# Patient Record
Sex: Male | Born: 1935 | Race: White | Hispanic: No | Marital: Married | Smoking: Never smoker
Health system: Southern US, Community
[De-identification: ages and names within clinical notes are randomized; demographics above are authoritative.]

## PROBLEM LIST (undated history)

## (undated) DIAGNOSIS — J45909 Unspecified asthma, uncomplicated: Secondary | ICD-10-CM

## (undated) HISTORY — PX: EXTERNAL EAR SURGERY: SHX627

## (undated) HISTORY — DX: Unspecified asthma, uncomplicated: J45.909

## (undated) HISTORY — PX: FOOT SURGERY: SHX648

---

## 2002-04-17 ENCOUNTER — Ambulatory Visit (HOSPITAL_BASED_OUTPATIENT_CLINIC_OR_DEPARTMENT_OTHER): Admission: RE | Admit: 2002-04-17 | Discharge: 2002-04-17 | Payer: Self-pay | Admitting: Internal Medicine

## 2005-06-30 ENCOUNTER — Encounter (INDEPENDENT_AMBULATORY_CARE_PROVIDER_SITE_OTHER): Payer: Self-pay | Admitting: *Deleted

## 2005-06-30 ENCOUNTER — Ambulatory Visit (HOSPITAL_BASED_OUTPATIENT_CLINIC_OR_DEPARTMENT_OTHER): Admission: RE | Admit: 2005-06-30 | Discharge: 2005-06-30 | Payer: Self-pay | Admitting: Orthopedic Surgery

## 2005-09-26 DIAGNOSIS — D689 Coagulation defect, unspecified: Secondary | ICD-10-CM | POA: Insufficient documentation

## 2006-01-01 ENCOUNTER — Ambulatory Visit: Payer: Self-pay | Admitting: Cardiology

## 2006-01-07 ENCOUNTER — Ambulatory Visit: Payer: Self-pay

## 2006-01-14 ENCOUNTER — Ambulatory Visit: Payer: Self-pay | Admitting: Cardiology

## 2006-03-09 ENCOUNTER — Ambulatory Visit (HOSPITAL_COMMUNITY): Admission: RE | Admit: 2006-03-09 | Discharge: 2006-03-09 | Payer: Self-pay | Admitting: Cardiology

## 2006-03-09 ENCOUNTER — Ambulatory Visit: Payer: Self-pay | Admitting: Cardiovascular Disease

## 2008-01-23 ENCOUNTER — Ambulatory Visit: Payer: Self-pay | Admitting: Cardiology

## 2008-01-25 ENCOUNTER — Ambulatory Visit (HOSPITAL_COMMUNITY): Admission: RE | Admit: 2008-01-25 | Discharge: 2008-01-25 | Payer: Self-pay | Admitting: Cardiology

## 2008-01-25 ENCOUNTER — Ambulatory Visit: Payer: Self-pay | Admitting: Cardiology

## 2008-02-03 ENCOUNTER — Ambulatory Visit: Payer: Self-pay | Admitting: Cardiology

## 2008-02-06 ENCOUNTER — Ambulatory Visit: Payer: Self-pay | Admitting: Internal Medicine

## 2008-02-06 ENCOUNTER — Emergency Department (HOSPITAL_COMMUNITY): Admission: EM | Admit: 2008-02-06 | Discharge: 2008-02-06 | Payer: Self-pay | Admitting: Emergency Medicine

## 2008-02-10 ENCOUNTER — Ambulatory Visit: Payer: Self-pay

## 2008-02-10 ENCOUNTER — Ambulatory Visit: Payer: Self-pay | Admitting: Cardiology

## 2008-06-08 ENCOUNTER — Encounter: Payer: Self-pay | Admitting: Cardiology

## 2008-06-11 ENCOUNTER — Encounter: Payer: Self-pay | Admitting: Cardiology

## 2008-08-15 ENCOUNTER — Encounter (INDEPENDENT_AMBULATORY_CARE_PROVIDER_SITE_OTHER): Payer: Self-pay | Admitting: *Deleted

## 2008-09-13 DIAGNOSIS — D126 Benign neoplasm of colon, unspecified: Secondary | ICD-10-CM | POA: Insufficient documentation

## 2010-04-29 IMAGING — CR DG CHEST 2V
2 series · 2 of 2 positions shown · non-contrast
Comparison: None

CLINICAL DATA: Pre cardioversion.

CHEST - 2 VIEW

[view not recorded (1 of 2)]
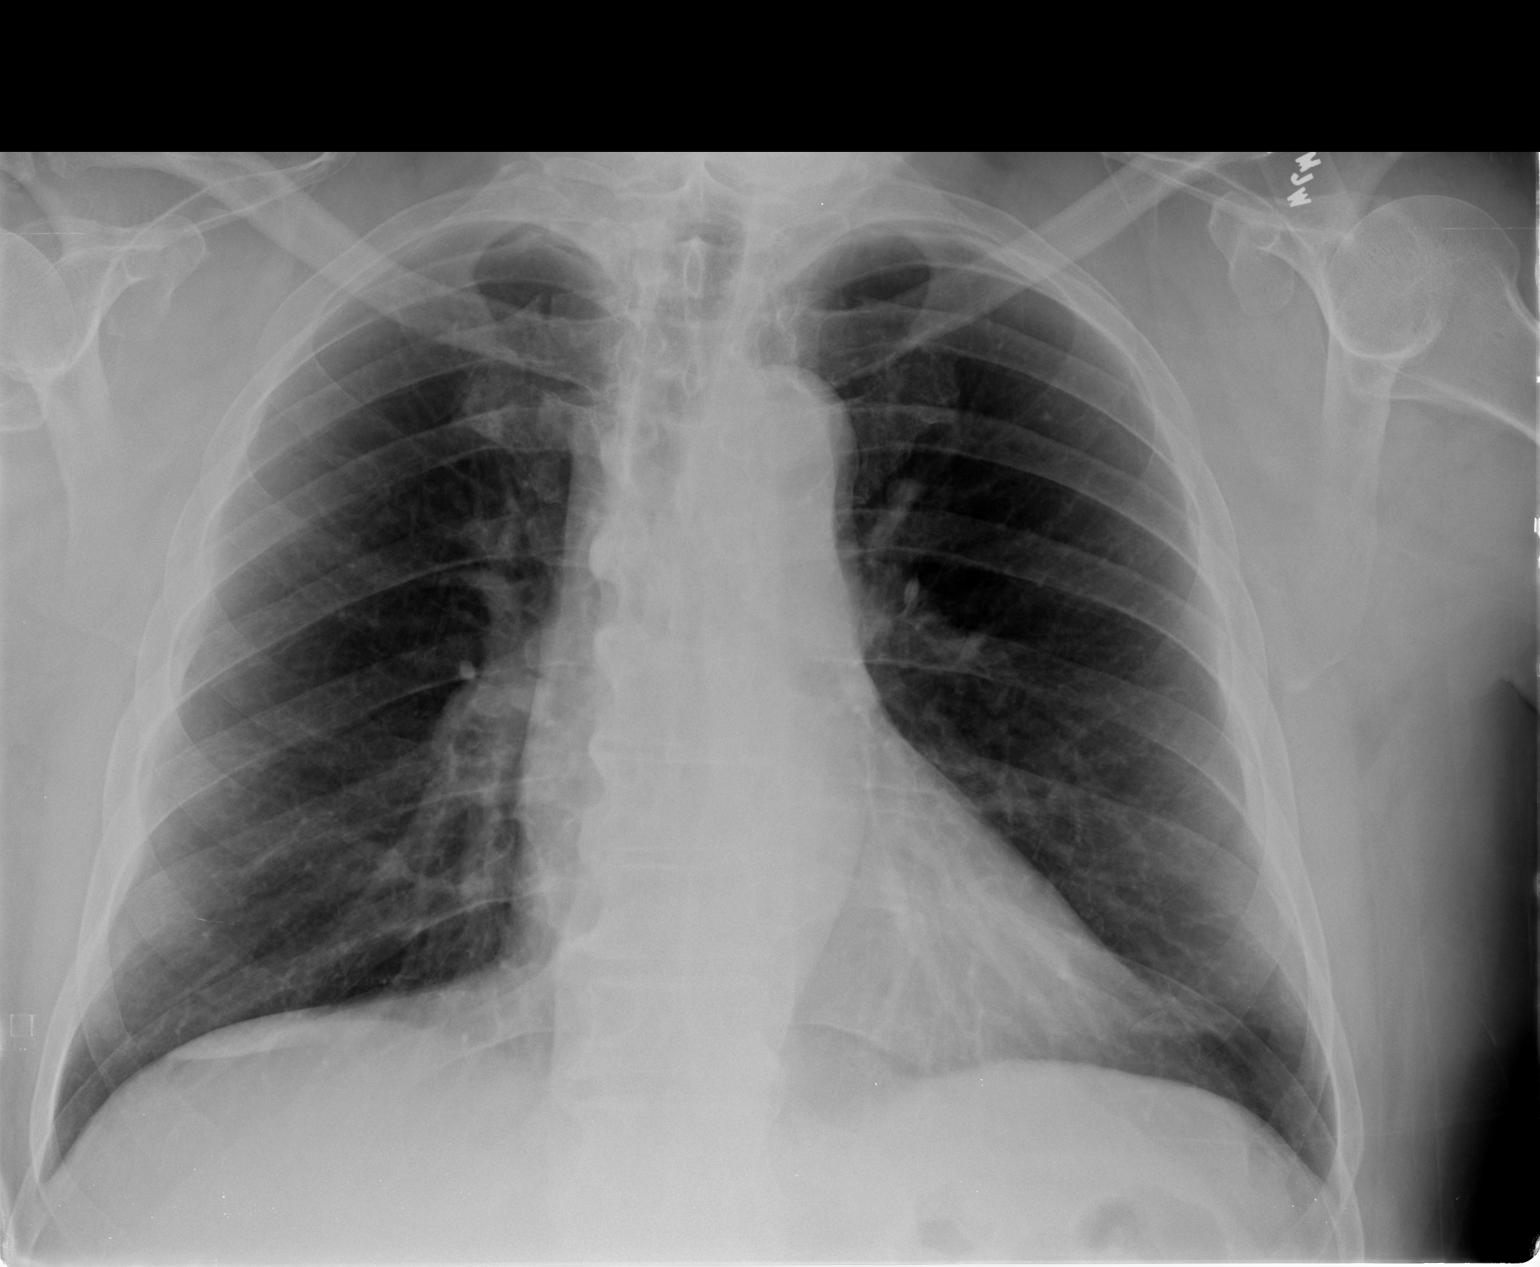

[view not recorded (2 of 2)]
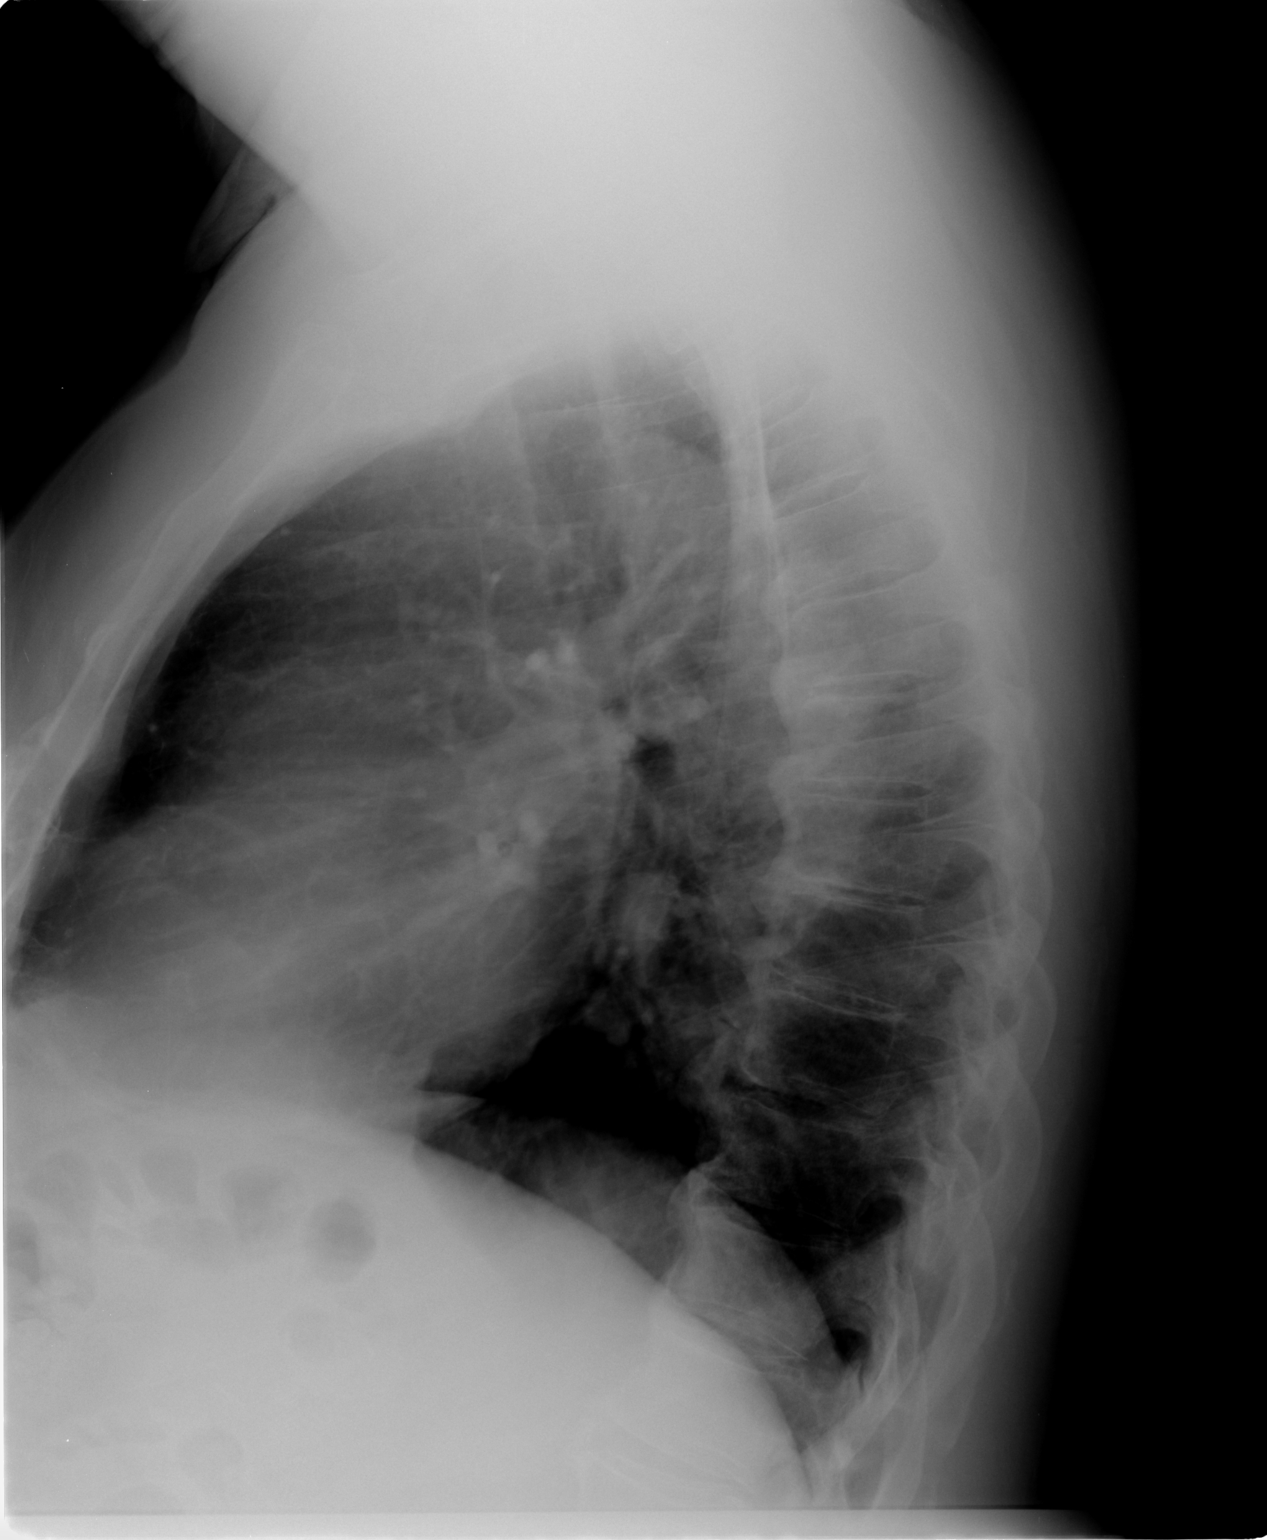

[2 of 2 positions shown; findings below may reference images not displayed]

FINDINGS: Heart upper limits normal in size.  No focal airspace
opacities or effusions.  Degenerative changes in the thoracic
spine.
IMPRESSION: No acute findings.

## 2010-05-12 LAB — URINALYSIS, ROUTINE W REFLEX MICROSCOPIC
Bilirubin Urine: NEGATIVE
Glucose, UA: NEGATIVE mg/dL
Ketones, ur: NEGATIVE mg/dL
Leukocytes, UA: NEGATIVE
Nitrite: NEGATIVE
Protein, ur: NEGATIVE mg/dL
Specific Gravity, Urine: 1.01 (ref 1.005–1.030)
Urobilinogen, UA: 0.2 mg/dL (ref 0.0–1.0)
pH: 6 (ref 5.0–8.0)

## 2010-05-12 LAB — TROPONIN I: Troponin I: 0.01 ng/mL (ref 0.00–0.06)

## 2010-05-12 LAB — COMPREHENSIVE METABOLIC PANEL
CO2: 25 mEq/L (ref 19–32)
Calcium: 9.3 mg/dL (ref 8.4–10.5)
Creatinine, Ser: 0.89 mg/dL (ref 0.4–1.5)
GFR calc Af Amer: >60 mL/min (ref >60)
GFR calc non Af Amer: >60 mL/min (ref >60)
Glucose, Bld: 131 mg/dL — ABNORMAL HIGH (ref 70–99)
Total Protein: 7.1 g/dL (ref 6.0–8.3)

## 2010-05-12 LAB — CBC
HCT: 48 % (ref 39.0–52.0)
Hemoglobin: 16.1 g/dL (ref 13.0–17.0)
MCHC: 33.5 g/dL (ref 30.0–36.0)
MCV: 92.3 fL (ref 78.0–100.0)
Platelets: 196 10*3/uL (ref 150–400)
RBC: 5.2 MIL/uL (ref 4.22–5.81)
RDW: 13.8 % (ref 11.5–15.5)
WBC: 11.6 10*3/uL — ABNORMAL HIGH (ref 4.0–10.5)

## 2010-05-12 LAB — DIFFERENTIAL
Basophils Absolute: 0 10*3/uL (ref 0.0–0.1)
Basophils Relative: 0 % (ref 0–1)
Eosinophils Absolute: 0.2 10*3/uL (ref 0.0–0.7)
Eosinophils Relative: 2 % (ref 0–5)
Lymphocytes Relative: 28 % (ref 12–46)
Lymphs Abs: 3.3 10*3/uL (ref 0.7–4.0)
Monocytes Absolute: 1.1 10*3/uL — ABNORMAL HIGH (ref 0.1–1.0)
Monocytes Relative: 10 % (ref 3–12)
Neutro Abs: 7 10*3/uL (ref 1.7–7.7)
Neutrophils Relative %: 60 % (ref 43–77)

## 2010-05-12 LAB — PROTIME-INR
INR: 2.4 — ABNORMAL HIGH (ref 0.00–1.49)
Prothrombin Time: 27.5 seconds — ABNORMAL HIGH (ref 11.6–15.2)

## 2010-05-12 LAB — CK TOTAL AND CKMB (NOT AT ARMC)
Relative Index: INVALID (ref 0.0–2.5)
Total CK: 87 U/L (ref 7–232)

## 2010-05-12 LAB — APTT: aPTT: 38 seconds — ABNORMAL HIGH (ref 24–37)

## 2010-05-12 LAB — URINE MICROSCOPIC-ADD ON

## 2010-06-10 NOTE — Op Note (Signed)
[name], Alan Beltran NO.:  [id number]   MEDICAL RECORD NO.:  [id number]          PATIENT TYPE:  OIB   LOCATION:  2899                         FACILITY:  [facility]   PHYSICIAN:  Rollene Rotunda, MD, FACCDATE OF BIRTH:  1934-02-08   DATE OF PROCEDURE:  01/25/2008  DATE OF DISCHARGE:  01/25/2008                               OPERATIVE REPORT   PRIMARY CARE PHYSICIAN:  Alfonse Alpers. Dagoberto Ligas, MD   PROCEDURE:  Direct current cardioversion.   INDICATIONS:  Symptomatic atrial fibrillation.   PROCEDURE NOTE:  The patient had therapeutic Coumadin levels documented  and recorded by Dr. Dagoberto Ligas.  This had been for greater than 4 weeks.  He  came to the holding area where he had appropriate informed consent.  Anesthesia was supplied by Dr. Krista Blue who used 210 mg of Pentothal.  He  had airway protection and monitoring throughout the procedure.  He was  cardioverted successfully with 150 joules of biphasic energy x1.  This  resulted in normal sinus rhythm with premature ventricular contractions.  There were no apparent complications.  Postprocedure EKG demonstrated  sinus rhythm.   FOLLOWUP:  The patient will see me in a couple of weeks.  He will remain  on his Coumadin.  His meds will be unchanged.      Rollene Rotunda, MD, [facility]  Electronically Signed     JH/MEDQ  D:  01/25/2008  T:  01/25/2008  Job:  [id number]   cc:   Alfonse Alpers. Dagoberto Ligas, M.D.

## 2010-06-10 NOTE — Assessment & Plan Note (Signed)
[facility]                            CARDIOLOGY OFFICE NOTE   Alan Beltran, Alan Beltran                          MRN:          [id number]  DATE:02/03/2008                            DOB:          May 13, 1934    PRIMARY CARE PHYSICIAN:  Alfonse Alpers. Dagoberto Ligas, MD   REASON FOR PRESENTATION:  Evaluate the patient with fatigue and atrial  fibrillation.   HISTORY OF PRESENT ILLNESS:  The patient is a pleasant 76 year old  gentleman who I met for the first time in late December.  He had been  seen by Dr. Diona Browner in the past.  He was in atrial fibrillation and  profoundly fatigued.  This has been a pattern in the past.  I did bring  him back and performed a cardioversion.  He was easily put back into  sinus rhythm.  Unfortunately, he remains just as fatigued.  I suppose at  the last time he had a cardioversion when he got much better, this time  he is still tired.  He says he sleeps well at night he thinks.  However,  when he wakes up in the day it is a struggle.  He is fatigued with any  activity.  He has not had any palpitation, presyncope, or syncope.  The  EKG today demonstrates that he is still in sinus rhythm.  He has had no  new shortness of breath.  He denies any PND or orthopnea.  He has had no  chest pressure, neck, or arm discomfort.  He is very limited in his  activities now because of this fatigue.  Of note, the patient does  report that he was told that he had sleep apnea with a sleep study 3-4  years ago.  He tried to wear the CPAP one night, but could not tolerate  it.  He took it back.   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 Ival Bible  note of 70% LAD lesion on catheterization in 1997.  However, the patient  does not collaborate  this.  I do not have any data on this either.  He  had a stress perfusion study, which was negative in 2007), back surgery  in 1973, surgery on his foot in 2004, ganglion cyst resection, and  resection of a palmar fibromatosis in 2007.   ALLERGIES/INTOLERANCES:  None.   MEDICATIONS:  1. Sertraline 50 mg daily.  2. Actos 45 mg daily.  3. Terazosin.  4. Allopurinol 300 mg daily.  5. Omeprazole 20 mg daily.  6. Spironolactone 25 mg daily.  7. Clotrimazole.  8. Zinc.  9. Coumadin.  10.Cardizem 240 mg daily.  11.Synthroid 275 mcg daily.   REVIEW OF SYSTEMS:  As stated in the HPI and otherwise negative for all  other systems.   PHYSICAL EXAMINATION:  GENERAL:  The patient is pleasant and in no  distress.  VITAL SIGNS:  Blood pressure 133/80, heart rate 73 and regular, weight  242 pounds, and body mass index 34.  HEENT:  Eyelids are unremarkable.  Pupils equal, round, and reactive to  light.  Fundi not visualized.  Oral mucosa unremarkable.  NECK:  No jugular venous distention at 45 degrees.  Carotid upstroke  brisk and symmetric.  No bruits.  No thyromegaly.  LYMPHATICS:  No cervical, axillary, or inguinal adenopathy.  LUNGS:  Clear to auscultation bilaterally.  BACK:  No costovertebral angle tenderness.  CHEST:  Unremarkable.  HEART:  PMI not displaced or sustained.  S1 and S2 within normal limits.  No S3.  No S4.  No clicks, rubs, and murmurs.  ABDOMEN:  Obese.  Positive bowel sounds.  Normal in frequency and pitch.  No bruits.  No rebound.  No  guarding.  No midline pulsatile mass.  No  hepatomegaly.  No splenomegaly.  SKIN:  No rashes.  No nodules.  EXTREMITIES:  Pulses 2+ throughout.  No edema.  No cyanosis.  No  clubbing.  NEURO:  Oriented to person, place, and time.  Cranial nerves II through  XII grossly intact.  Motor grossly intact.   EKG sinus rhythm, rate 73, axis within normal limits, intervals within  normal limits, poor anterior R-wave progression.    ASSESSMENT AND PLAN:  1. Fatigue.  At this point, the patient's fatigue has not responded to      cardioversion.  He is long enough out now that he has the      mechanical benefits as well as the electrical benefits of this.      Therefore, there is another etiology to his fatigue.  I feel      strongly that this could be sleep apnea.  However, he states he      absolutely could not wear the CPAP.  We discussed strategies and he      states he will try to lose 40 pounds and thinks he can do this.  I      told him the surgery is probably not a great option.  He certainly      would not want to consider tracheostomy.  He does not want to have      another sleep study to confirm this as a diagnosis.  He will      proceed with weight loss.  In the mean time, I think we should look      for other etiologies as well.  I will have him wear 24-hour Holter      monitor, although I doubt he is having any paroxysms of atrial      fibrillation.  Finally, he can have a stress perfusion study given      the report of previous coronary disease and the possibility (though      moderate at best) that this is manifestation of obstructive      coronary disease.  2. Atrial fibrillation as above.  He remains of on Coumadin.  3. Obesity.  Again, we discussed weight loss and I think he is      committed and probably will lose the weight and hopefully will see      him feel better with this.  4. Diabetes per Dr. Dagoberto Ligas.  5. Coronary disease as above.     Rollene Rotunda, MD, [facility]  Electronically Signed    JH/MedQ  DD: 02/03/2008  DT: 02/04/2008  Job #: [id number]   cc:   Alfonse Alpers. Dagoberto Ligas, M.D.

## 2010-06-10 NOTE — Assessment & Plan Note (Signed)
[facility]                            CARDIOLOGY OFFICE NOTE   Alan Beltran, Alan Beltran                          MRN:          [id number]  DATE:01/23/2008                            DOB:          25-Dec-1934    PRIMARY CARE PHYSICIAN:  Alfonse Alpers. Dagoberto Ligas, MD   REASON FOR PRESENTATION:  Evaluate the patient with atrial fibrillation.   HISTORY OF PRESENT ILLNESS:  The patient is a very pleasant 76 year old  gentleman who had been seen by Dr. Diona Browner in the past.  He has a  history of atrial fibrillation.  He actually underwent cardioversion in  February 2008 for management of this.  When he has been fibrillation in  the past, he has been very fatigued.  He says this fatigue started to  begin about 5 weeks ago.  He has been documented to be back in atrial  fibrillation.  He says he sees Dr. Dagoberto Ligas frequently.  He has had recent  EKGs documenting the fibrillation with a rapid rate.  He has had his  Cardizem titrated upward.  He is less fatigued than when they started  this therapy, but still not at his baseline.  He actually does not feel  any palpitations and had no presyncope or syncope.  He denies any chest  discomfort, neck or arm discomfort.  He has had no shortness of breath,  PND, or orthopnea.   PAST MEDICAL HISTORY:  1. Apparent hypopituitarism treated by Dr. Dagoberto Ligas.  2. Hypothyroidism.  3. Diabetes mellitus.  4. Atrial fibrillation maintained on Coumadin status post      cardioversion in the past.  5. Apparent coronary artery disease (there is a report in Dr.      Ival Bible note of a 70% LAD lesion at catheterization in 1997.      However, the patient does not know of this and I do not see the      documentation.  He has had negative stress perfusion studies with      the most recent being in December 2007).   PAST SURGICAL HISTORY:  1. Back surgery in 1973.  2. Surgery on his foot in 2004.  3. Ganglion cyst resection.  4. Resection of palmar  fibromatosis in 2007.   ALLERGIES/INTOLERANCES:  None.   MEDICATIONS:  1. Sertraline 50 mg daily.  2. Actos 45 mg daily.  3. Synthroid 175 mcg daily.  4. Terazosin 5 mg daily.  5. Allopurinol 300 mg daily.  6. Omeprazole 20 mg daily.  7. Spironolactone 25 mg daily.  8. Chlor-Trimeton.  9. Zinc.  10.Folic acid.  11.Coumadin.  12.Cardizem 240 mg daily.  13.Lasix.   SOCIAL HISTORY:  The patient is married.  He has 2 children.  He still  works in Airline pilot.  He does not smoke cigarettes or drink alcohol.   Family history is contributory for his father dying suddenly at age 16  of myocardial infarction.   REVIEW OF SYSTEMS:  As stated in the HPI and otherwise positive for  erectile dysfunction, joint pains, reflux.  Negative for all other  systems.  PHYSICAL EXAMINATION:  GENERAL:  The patient is pleasant and in no  distress.  VITAL SIGNS:  Blood pressure 134/92, heart rate 96 and irregular, weight  241 pounds, body mass index 34.  HEENT:  Eyes unremarkable, pupils equal, round, and reactive to light,  fundi not visualized, oral mucosa unremarkable.  NECK:  No jugular venous distention at 45 degrees, carotid upstroke  brisk and symmetric, no bruits, no thyromegaly.  LYMPHATICS:  No cervical, axillary, or inguinal adenopathy.  LUNGS:  Clear to auscultation bilaterally.  BACK:  No costovertebral angle.  CHEST:  Unremarkable.  HEART:  PMI not displaced or sustained, S1 and S2 within normal limits,  no S3, no murmurs.  ABDOMEN:  Obese, positive bowel sounds normal in frequency and pitch, no  bruits, no rebound, no guarding, no midline pulsatile mass, no  hepatomegaly, no splenomegaly.  SKIN:  No rashes, no nodules.  EXTREMITIES:  Pulses 2+ throughout, no edema, no cyanosis, no clubbing.  NEUROLOGIC:  Oriented to person, place, and time, cranial nerves II  through XII grossly intact, motor grossly intact.   EKG:  Atrial fibrillation, rate 96, axis within normal limits,  intervals  within normal limits, no acute ST-T wave changes.   ASSESSMENT AND PLAN:  1. Atrial fibrillation.  The patient has symptomatic recurrence of      atrial fibrillation.  He gets very fatigued with this.  At this      point, I think cardioversion without the addition of new drugs is      warranted since he stayed in sinus rhythm for almost 2 years after      the last procedure.  I have documented through Dr. Jerelene Redden office      that he has had therapeutic INRs (2.3 on December 09, 2007; 3.4 on      January 06, 2008; 2.3 on January 11, 2008; 2.7 on January 18, 2008).  Therefore, we can carryout the cardioversion this week.  If      he fails to maintain sinus rhythm for any reasonable length of time      after this, then I would treat him probably with Tikosyn given his      thyroid problems, although we could consider a class II medication      as well.  The cardioversion will be scheduled for Thursday.  2. Coronary artery disease.  The patient has had a negative stress      perfusion study in 2007.  He is not having any symptoms.  I am      going to try to pull old records from the time of his      catheterization in 1997 to clarify the issue of his LAD stenosis.  3. Dyslipidemia, per Dr. Dagoberto Ligas with the goal LDL less than 100 and      HDL greater than 40.  He currently is not on a statin, and      certainly if he has coronary disease, I will suggest that this be      retried by Dr. Dagoberto Ligas.  The patient was taken off Crestor and      Lipitor in the past.  4. Hypertension.  Blood pressure is controlled, and he will continue      the medications as listed.  5. Endocrinologic abnormalities.  This is followed very closely by Dr.      Dagoberto Ligas.  6. Followup.  I will see him at the time of his cardioversion.  Rollene Rotunda, MD, [facility]  Electronically Signed    JH/MedQ  DD: 01/23/2008  DT: 01/24/2008  Job #: [id number]   cc:   Alfonse Alpers. Dagoberto Ligas, M.D.

## 2010-06-13 NOTE — Consult Note (Signed)
NAMEFRANZ, SVEC                 ACCOUNT NO.:  [id number]   MEDICAL RECORD NO.:  [id number]          PATIENT TYPE:  OIB   LOCATION:  2899                         FACILITY:  [facility]   PHYSICIAN:  Veverly Fells. Excell Seltzer, MD  DATE OF BIRTH:  [birth year]   DATE OF CONSULTATION:  03/09/2006  DATE OF DISCHARGE:  03/09/2006                                 CONSULTATION   HISTORY OF PRESENT ILLNESS:  Alan Beltran presents as an outpatient for  electrical cardioversion for his chronic atrial fibrillation.  He is a  76 year old gentleman who has been anticoagulated with warfarin and has  persistent atrial fibrillation.  He complains of fatigue and exercise  intolerance and has subsequently been arranged for elective  cardioversion to see if restoring sinus rhythm will give him symptomatic  improvement.  His INR has been therapeutic over the preceding four  weeks.   Using the assistance of anesthesia, 200 mg of sodium pentothal were  administered.  Once the patient became unresponsive, a synchronized  cardioversion was performed with 150 joules.  This restored sinus rhythm  with one shock.  The patient had frequent ventricular ectopy as well as  atrial ectopy following cardioversion, but he maintained sinus rhythm.  He will be discharged home after a short period of observation.   CONCLUSION:  Successful D/C cardioversion of atrial fibrillation to  normal sinus rhythm.      Veverly Fells. Excell Seltzer, MD  Electronically Signed     MDC/MEDQ  D:  03/16/2006  T:  03/17/2006  Job:  [id number]   cc:   Jonelle Sidle, MD

## 2010-06-13 NOTE — Assessment & Plan Note (Signed)
[facility]CARE                            CARDIOLOGY OFFICE NOTE   Alan, Beltran                          MRN:          [id number]  DATE:01/01/2006                            DOB:          [birth year]    REASON FOR CONSULTATION:  Management of atrial fibrillation.   HISTORY OF PRESENT ILLNESS:  Alan Beltran is a pleasant 76 year old male  with history of including type 2 diabetes mellitus, hypothyroidism,  hypogonadism, hypertension, gout, hypercholesterolemia, and previously  documented coronary artery disease.  He had cardiac catheterization in  1997.  Based on available information, he had a 70% left anterior  descending stenosis at that time and had a followup Cardiolite  evaluation in 2000 revealing slight inferior ischemia.  He has not  manifested any active angina or limiting dyspnea on exertion on medical  therapy.  I also note a history of atrial fibrillation that seems to be  potentially chronic based on the patient's description and he has been  managed with Coumadin and Cardizem at least for the last few months by  Dr. Dagoberto Ligas.  Symptomatically Alan Beltran has had a feeling of general  heaviness in his legs as well as weakness, but no frank exertional  claudication or exertional angina.  He has NYHA Class II dyspnea on  exertion.  He states that he has felt this way over the last six months  and was previously walking up to four miles at a time on his treadmill  in one hour.  He is referred today for the possibility of considering a  cardioversion to see if perhaps his atrial fibrillation is related in  someway to his symptomatology.  He has been well managed for his atrial  fibrillation by Dr. Dagoberto Ligas with seemingly good heart rate control and  anticoagulation with a CHADS score of 2.  He has not had any recent  followup ischemic testing.   ALLERGIES:  No known drug allergies.   PRESENT MEDICATIONS:  1. Coumadin as directed by Dr. Dagoberto Ligas.  2. Crestor 5 mg p.o. daily.  3. Actos 45 mg p.o. daily.  4. Sertraline 50 mg daily.  5. Synthroid 175 mcg p.o. daily.  6. Terazosin 5 mg p.o. daily.  7. Allopurinol 300 mg p.o. daily.  8. Omeprazole 20 mg p.o. daily.  9. Spironolactone 25 mg p.o. daily.  10.Cardizem CD 240 mg p.o. daily.   PAST MEDICAL HISTORY:  As outlined in history of present illness.  He  has also had previous surgery on his back in 1973, his foot in 2004 and  hand in 2007.   SOCIAL HISTORY:  The patient is married and has two children.  Works as  a Human resources officer.  He denies any active tobacco or alcohol use.   FAMILY HISTORY:  Significant for coronary artery disease in the  patient's father who died at age 44.   REVIEW OF SYSTEMS:  As described in history of present illness.  He has  erectile dysfunction, arthritic pains, and reflux.   PHYSICAL EXAMINATION:  VITAL SIGNS:  Blood pressure today is 130/90,  heart  rate 76 and irregular.  Weight is 247 pounds.  GENERAL:  This is an obese male, seemed to be in no acute distress.  HEENT:  Conjunctivae was normal.  Pharynx is clear.  NECK:  Supple without jugular venous pressure.  There is no thyroid  tenderness noted.  LUNGS:  Generally clear with somewhat diminished breath sounds.  No  active wheezing.  CARDIAC:  Distant irregularly irregular rhythm with no loud systolic  murmur.  No pericardial rub or S3 gallop.  ABDOMEN:  Soft, protuberant/obese.  Cannot adequately palpate liver  edge.  The bowel sounds are present.  There is no guarding.  EXTREMITIES:  No significant pitting edema.  SKIN:  Warm and dry.  MUSCULOSKELETAL: No kyphosis is noted.  NEUROPSYCHIATRIC:  Alert and oriented x3.  Affect is normal.   Faxed electrocardiogram from Dr. Jerelene Redden office shows atrial  fibrillation with controlled rate in the 80's and a premature  ventricular contraction complex versus aberrantly conducted beat.  There  were otherwise nonspecific ST-T wave  changes.   IMPRESSION/RECOMMENDATIONS:  1. Atrial fibrillation, duration not entirely clear but presumably      chronic.  This is well-managed by Dr. Dagoberto Ligas on therapy including      Coumadin and Cardizem CD.  His CHADS score is 2.  It is not      entirely clear that his general fatigue and leg heaviness is      clearly related to the atrial fibrillation although not      unreasonable to consider the possibility of cardioversion to see if      he notices a change.  My worry is that he will ultimately revert to      atrial fibrillation and will continue with a strategy of heart rate      control and anticoagulation long term however.  Antiarrhythmic      therapy could be considered but would not pursue this at this      point.  Given his history of underlying ischemic heart disease, I      also think it would be reasonable to proceed with a Myoview to      insure that he is not having marked ischemia related to his      symptoms and also lower extremity arterial Dopplers to exclude      obstructive peripheral arterial disease.  I would ask that his      Coumadin be checked on a weekly basis so that we can document four      weeks of therapy to Coumadin with an INR in the 2 to 3 range prior      to proceeding with cardioversion.  Otherwise I will plan to see him      back to review his cardiac testing in the interim.  I did not make      any medication changes today.  2. Further plans to follow.     Alan Sidle, MD  Electronically Signed   SGM/MedQ  DD: 01/01/2006  DT: 01/01/2006  Job #: [id number]   cc:   Alfonse Alpers. Dagoberto Ligas, M.D.

## 2010-06-13 NOTE — Assessment & Plan Note (Signed)
[facility]                            CARDIOLOGY OFFICE NOTE   HANSEN, CARINO                          MRN:          [id number]  DATE:01/14/2006                            DOB:          Jun 18, 1934    ENDOCRINOLOGIST:  Dr. Corrin Parker.   REASON FOR VISIT:  Followup cardiac testing.   HISTORY OF PRESENT ILLNESS:  I saw Mr. Alan Beltran back on December 7.  He has  atrial fibrillation of uncertain duration and is being managed with  Coumadin and Cardizem CD at this point, with a CHADS score of 2.  He has  had some leg fatigue and general fatigue, as well as edema, but  otherwise has tolerated this fairly well.  I referred him for lower  extremity arterial studies which actually looked fairly reassuring,  reporting normal ABIs and no evidence of significant obstructive  disease.  He also underwent a basic ischemic evaluation with an  Adenosine Myoview reporting no evidence of scar or ischemia and an  ejection fraction of 51%.  I reviewed the results with the patient and  his wife today.  I do think it is still reasonable to consider an  elective cardioversion after 4 weeks of therapeutic Coumadin therapy to  see if he experiences any improvement in symptoms and sinus rhythm.  This may help to guide our future management of his dysrhythmia and  whether to consider anti- arrhythmia  therapy or not.  Most recent INRs  were 2.3 and 4.9 over the last week.   ALLERGIES:  No known drug allergies.   PRESENT MEDICATIONS:  1. Sertraline 50 mg p.o. daily.  2. Actos 45 mg p.o. daily.  3. Synthroid 175 mcg p.o. daily.  4. Terazosin 5 mg p.o. daily.  5. Allopurinol 300 mg p.o. daily.  6. Omeprazole 20 mg p.o. daily.  7. Spironolactone 25 mg p.o. daily.  8. Chlor-Trimeton p.r.n.  9. Zinc, folic acid.  10.Coumadin as directed by Dr. Dagoberto Ligas.  11.Cardizem CD 240 mg p.o. daily.  12.Lasix daily.   REVIEW OF SYSTEMS:  As in history of present illness.   PHYSICAL EXAMINATION:  Blood pressure is 135/90, heart rate is in the  80s in atrial fibrillation.  Weight is 249 pounds.  The patient is  comfortable and in no acute distress.  NECK:  Reveals an elevated jugular venous pressure without bruits.  LUNGS:  Clear, without labored breathing.  CARDIAC:  Irregular irregular rhythm without rub, murmur or gallop.  EXTREMITIES:  Show improving edema.   IMPRESSION/RECOMMENDATIONS:  1. Persistent atrial fibrillation, rate controlled and on Coumadin,      recent INR levels as reported above.  We will plan an elective      cardioversion once he has had 4 straight weeks of therapeutic INR      levels.  He is having his blood checked weekly through Dr. Jerelene Redden      office.  I have encouraged him to continue a basic walking regimen      in light of his recent studies and we will see if he notices any  symptomatic improvements following his cardioversion attempt.  2. Further plans to follow.     Jonelle Sidle, MD  Electronically Signed    SGM/MedQ  DD: 01/14/2006  DT: 01/14/2006  Job #: [id number]   cc:   Alfonse Alpers. Dagoberto Ligas, M.D.

## 2010-06-13 NOTE — Op Note (Signed)
[name], [name]                 ACCOUNT NO.:  [id number]   MEDICAL RECORD NO.:  [id number]          PATIENT TYPE:  AMB   LOCATION:  DSC                          FACILITY:  [facility]   PHYSICIAN:  Katy Fitch. Sypher, M.D. DATE OF BIRTH:  [birth year]   DATE OF PROCEDURE:  06/30/2005  DATE OF DISCHARGE:                                 OPERATIVE REPORT   PREOPERATIVE DIAGNOSIS:  1.  Left long finger A1/A2 pulley ganglion.  2.  Stenosing tenosynovitis right ring finger at A1 pulley.  3.  Mass volar aspect of right ring finger overlying flexor sheath at A2/C1      pulleys.   POSTOPERATIVE DIAGNOSIS:  1.  Left long finger A1/A2 ganglion.  2.  Right ring finger stenosing tenosynovitis at A1 pulley.  3.  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   OPERATION:  1.  Excision of left long finger A1/A2 pulley ganglion cyst.  2.  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  3.  Resection of the right ring finger volar mass consistent with myxomatous      degeneration of volar vein/ganglion.   OPERATIONS:  Katy Fitch. Sypher, M.D.   ASSISTANT:  Marveen Reeks. Dasnoit, P.A.-C.   ANESTHESIA:  2% lidocaine metacarpal head level block of left long finger  and right ring finger.   SUPERVISING ANESTHESIOLOGIST:  Janetta Hora. Gelene Mink, M.D.   INDICATIONS:  Alan Beltran is a 76 year old gentleman who is an avid golfer.  He presents for evaluation of bilateral hand predicaments that are impairing  his golf game.  He has a chronic right ring trigger finger and a mass over  the volar aspect of his right ring finger just proximal to his PIP flexion  crease. On the left hand, he had a firm painful mass overlying his A1/A2  pulley junction overlying the flexor sheath.  He requested the lesions be  addressed so he could improve his comfort while playing golf.  After  informed consent, he  is brought to the operating at this time.  Preoperatively, Dr. Gelene Mink studied the detailed medical records from Dr.  Dagoberto Ligas.  He was noted to have minor sleep apnea.  After informed consent, he  is brought to the operating room at this time.   PROCEDURE:  [name] is brought to the operating room and placed in  supine position on the operating table.  Following an anesthesia consult by  Dr. Gelene Mink, light sedation and monitored anesthesia care with digital  blocks utilizing 2% lidocaine was recommended.  [name] was transferred to  the operating room and placed in supine position followed by light sedation.  Pneumatic tourniquets were applied to the proximal right brachium and the  proximal left forearm with antecubital IV access on the left.  The arms were  prepped with Betadine soap solution and sterilely draped.  2% lidocaine  metacarpal head level blocks were placed at the MP level of the right  ring  finger and the left long finger.  When anesthesia was satisfactory, the  right arm was exsanguinated with an Esmarch bandage and atrial tourniquet  inflated to 260 mmHg due to systolic hypertension.  The procedure commenced  with a transverse incision in the distal palmar crease overlying the A1  pulley.  The subcutaneous tissues were carefully divided revealing  hypertrophic palmar fascia with nodular disease in the pre-tendinous fibers  of the ring finger.  The pre-tendinous fibers were resected after  circumferential dissection.  This relieved the palmar fibromatosis nodule.  The A1 pulley was isolated and split along its radial border with scalpel  and scissors.  The tendon was delivered and stripped of hypertrophic  synovium.   Attention was then directed to the distal PIP region of the ring finger.  The palpable mass was identified.  This was volar to the A2/C1 pulley  junction.  An oblique incision was fashioned following the [employer].  The subcutaneous tissues were  carefully divided revealing a myxomatous mass  adherent to one of the volar veins consistent with myxomatous degeneration  of a volar vein/ganglion formation.  This was circumferentially resected  with a portion of the vein.  Bipolar cautery was used to obtain hemostasis.   Both wounds were closed with intradermal 3-0 Prolene.  The wounds were then  dressed with Xeroflow, sterile gauze, and an Ace wrap.   Attention was then directed to the long finger of the left hand.  After  exsanguination of the left hand by direct compression, an arterial  tourniquet on the forearm was inflated to 260 mmHg.  The procedure commenced  with an oblique incision directly over the palpable mass.  The subcutaneous  tissues were carefully divided revealing the A1/A2 ganglion.  After blunt  retractors were placed to protect the neurovascular bundles, the ganglion  was dissected with a fine sharp rongeur.  A small portion of the pulley  system was resected with the ganglion to prevent recurrence.  The wound was  then repaired with intradermal 3-0 Prolene.  A compressive dressing was  applied with Xeroflow, sterile gauze, and an Ace wrap.  There were no  apparent complications.   [name] tolerated the surgery and anesthesia well.  He was transferred to  the recovery room with stable vital signs.  He will be discharged in the  care of his wife with a prescription for Darvocet N100 one p.o. q.4-6h.  p.r.n. pain, 20 tablets without refill.      Katy Fitch Sypher, M.D.  Electronically Signed     RVS/MEDQ  D:  06/30/2005  T:  06/30/2005  Job:  [id number]

## 2010-06-13 NOTE — Consult Note (Signed)
NAMEJAMONI, Alan Beltran NO.:  [id number]   MEDICAL RECORD NO.:  [id number]          PATIENT TYPE:  EMS   LOCATION:  MAJO                         FACILITY:  [facility]   PHYSICIAN:  Bevelyn Buckles. Bensimhon, MDDATE OF BIRTH:  05/25/1934   DATE OF CONSULTATION:  DATE OF DISCHARGE:  02/06/2008                                 CONSULTATION   PRIMARY CARDIOLOGIST:  Rollene Rotunda, MD, [facility]   PRIMARY CARE PHYSICIAN:  Alfonse Alpers. Dagoberto Ligas, MD   Mr. Arzuaga is a 76 year old Caucasian gentleman who presents to [facility] Emergency Room today complaining of profound weakness x5 weeks.  He  states Dr. Dagoberto Ligas is aware and so was Dr. Antoine Poche.  The patient is  scheduled for a stress Myoview this Friday, but states he just feels so  weak that he could not wait until then.  Mr. Coleman denies any other  associated symptoms.  He states Dr. Dagoberto Ligas has been working him up with  a lab work.  He states his testosterone dose was increased without  improvement in symptoms.  His Synthroid dose was increased.  When no  change was made in his generalized weakness, he was sent to Dr. Antoine Poche  for further evaluation, thus the stress test was scheduled.  However,  Mr. Parenteau states he just does not feel right today.  His legs are so  weak he can barely walk.  He denies any chest discomfort or any  shortness of breath.  Here in the emergency room, he has been in sinus  rhythm with frequent PVCs, otherwise normal sinus rhythm on the monitor.   PAST MEDICAL HISTORY:  1. Paroxysmal atrial fib status post cardioversion/Coumadin therapy.  2. Hypopituitarism.  3. Diabetes.  4. Obstructive sleep apnea with noncompliance to CPAP.  5. Gout.  6. Asthma.  7. Back surgery.  8. Stress Myoview negative in 2007.  9. Hypothyroidism.  10.Cardiac catheterization in 1997.   SOCIAL HISTORY:  He lives with his wife.  He is a Medical illustrator.  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  He has adult children.  Denies any  tobacco, EtOH, drug, or herbal medication use.  He used to walk and play  golf but states he has gotten so weak in his legs he cannot do that any  longer.   FAMILY HISTORY:  Mother deceased in her 33s secondary to aneurysm.  Father deceased at age 72 secondary to MI.  Sibling medical history  unknown.   REVIEW OF SYSTEMS:  Positive for mild lightheadedness, leg fatigue, and  generalized weakness and extreme fatigue.   ALLERGIES:  CRESTOR and LIPITOR.   CURRENT MEDICATIONS:  1. Metformin 500 b.i.d.  2. Actos 45.  3. Coumadin.  4. Sertraline 50.  5. Synthroid 275 mcg.  6. Terazosin 5.  7. Allopurinol 300.  8. Spironolactone 25.  9. Cardizem CD 240.  10.Nasonex nasal spray.   PHYSICAL EXAMINATION:  VITAL SIGNS:  Temperature is 97.1, heart rate 95,  respirations 18, and blood pressure 112/79.  The patient is saturating  98% on room air.  GENERAL:  No acute  distress.  HEENT:  Unremarkable.  NECK:  Supple without lymphadenopathy, JVD, or bruits.  CARDIOVASCULAR:  S1 and S2.  Regular rate and rhythm.  LUNGS:  Clear to auscultation.  SKIN:  Warm and dry.  ABDOMEN:  Soft and nontender.  Positive bowel sounds.  LOWER EXTREMITIES:  Without clubbing, cyanosis, or edema.  NEUROLOGICAL:  Alert and oriented x3.   Chest x-ray is pending.  EKG; sinus rhythm at a rate 82 with frequent  PVCs.  Lab work is pending at this time.   IMPRESSION:  Profound fatigue with unclear etiology in the setting of  obstructive sleep apnea, diabetes, paroxysmal atrial fibrillation, and  nonobstructive coronary artery disease.  The patient has been seen and  examined by Dr. Gala Romney who has discussed the patient's status with  Dr. Antoine Poche.  If blood work is stable, we will plan on discharging home  from the ER and followup with outpatient stress Myoview as previously  scheduled.  If lab work is abnormal, we will plan on admitting the  patient.      Dorian Pod, ACNP      Bevelyn Buckles.  Bensimhon, MD  Electronically Signed    MB/MEDQ  D:  02/15/2008  T:  02/16/2008  Job:  [id number]

## 2010-09-04 DIAGNOSIS — E119 Type 2 diabetes mellitus without complications: Secondary | ICD-10-CM | POA: Insufficient documentation

## 2010-10-31 LAB — CBC
HCT: 44.3 % (ref 39.0–52.0)
Hemoglobin: 14.8 g/dL (ref 13.0–17.0)
MCHC: 33.4 g/dL (ref 30.0–36.0)
RBC: 4.82 MIL/uL (ref 4.22–5.81)

## 2010-10-31 LAB — BASIC METABOLIC PANEL
CO2: 26 mEq/L (ref 19–32)
Calcium: 9.2 mg/dL (ref 8.4–10.5)
Chloride: 97 mEq/L (ref 96–112)
GFR calc Af Amer: >60 mL/min (ref >60)
Potassium: 3.8 mEq/L (ref 3.5–5.1)
Sodium: 132 mEq/L — ABNORMAL LOW (ref 135–145)

## 2010-10-31 LAB — PROTIME-INR: INR: 2.2 — ABNORMAL HIGH (ref 0.00–1.49)

## 2010-11-29 DIAGNOSIS — I4891 Unspecified atrial fibrillation: Secondary | ICD-10-CM | POA: Diagnosis not present

## 2010-11-29 DIAGNOSIS — R5383 Other fatigue: Secondary | ICD-10-CM | POA: Diagnosis not present

## 2010-11-29 DIAGNOSIS — I10 Essential (primary) hypertension: Secondary | ICD-10-CM | POA: Diagnosis not present

## 2010-11-29 DIAGNOSIS — J441 Chronic obstructive pulmonary disease with (acute) exacerbation: Secondary | ICD-10-CM | POA: Diagnosis not present

## 2010-11-29 DIAGNOSIS — E119 Type 2 diabetes mellitus without complications: Secondary | ICD-10-CM | POA: Diagnosis not present

## 2010-11-29 DIAGNOSIS — Z48812 Encounter for surgical aftercare following surgery on the circulatory system: Secondary | ICD-10-CM | POA: Diagnosis not present

## 2010-12-01 DIAGNOSIS — Z48812 Encounter for surgical aftercare following surgery on the circulatory system: Secondary | ICD-10-CM | POA: Diagnosis not present

## 2010-12-01 DIAGNOSIS — I10 Essential (primary) hypertension: Secondary | ICD-10-CM | POA: Diagnosis not present

## 2010-12-01 DIAGNOSIS — E119 Type 2 diabetes mellitus without complications: Secondary | ICD-10-CM | POA: Diagnosis not present

## 2010-12-01 DIAGNOSIS — R5383 Other fatigue: Secondary | ICD-10-CM | POA: Diagnosis not present

## 2010-12-01 DIAGNOSIS — J441 Chronic obstructive pulmonary disease with (acute) exacerbation: Secondary | ICD-10-CM | POA: Diagnosis not present

## 2010-12-01 DIAGNOSIS — I4891 Unspecified atrial fibrillation: Secondary | ICD-10-CM | POA: Diagnosis not present

## 2010-12-04 DIAGNOSIS — J441 Chronic obstructive pulmonary disease with (acute) exacerbation: Secondary | ICD-10-CM | POA: Diagnosis not present

## 2010-12-04 DIAGNOSIS — R5383 Other fatigue: Secondary | ICD-10-CM | POA: Diagnosis not present

## 2010-12-04 DIAGNOSIS — E119 Type 2 diabetes mellitus without complications: Secondary | ICD-10-CM | POA: Diagnosis not present

## 2010-12-04 DIAGNOSIS — I4891 Unspecified atrial fibrillation: Secondary | ICD-10-CM | POA: Diagnosis not present

## 2010-12-04 DIAGNOSIS — Z48812 Encounter for surgical aftercare following surgery on the circulatory system: Secondary | ICD-10-CM | POA: Diagnosis not present

## 2010-12-04 DIAGNOSIS — I10 Essential (primary) hypertension: Secondary | ICD-10-CM | POA: Diagnosis not present

## 2010-12-05 DIAGNOSIS — Z48812 Encounter for surgical aftercare following surgery on the circulatory system: Secondary | ICD-10-CM | POA: Diagnosis not present

## 2010-12-05 DIAGNOSIS — J441 Chronic obstructive pulmonary disease with (acute) exacerbation: Secondary | ICD-10-CM | POA: Diagnosis not present

## 2010-12-05 DIAGNOSIS — E119 Type 2 diabetes mellitus without complications: Secondary | ICD-10-CM | POA: Diagnosis not present

## 2010-12-05 DIAGNOSIS — I4891 Unspecified atrial fibrillation: Secondary | ICD-10-CM | POA: Diagnosis not present

## 2010-12-05 DIAGNOSIS — I10 Essential (primary) hypertension: Secondary | ICD-10-CM | POA: Diagnosis not present

## 2010-12-05 DIAGNOSIS — R5381 Other malaise: Secondary | ICD-10-CM | POA: Diagnosis not present

## 2010-12-08 DIAGNOSIS — Z48812 Encounter for surgical aftercare following surgery on the circulatory system: Secondary | ICD-10-CM | POA: Diagnosis not present

## 2010-12-08 DIAGNOSIS — J441 Chronic obstructive pulmonary disease with (acute) exacerbation: Secondary | ICD-10-CM | POA: Diagnosis not present

## 2010-12-08 DIAGNOSIS — I10 Essential (primary) hypertension: Secondary | ICD-10-CM | POA: Diagnosis not present

## 2010-12-08 DIAGNOSIS — E119 Type 2 diabetes mellitus without complications: Secondary | ICD-10-CM | POA: Diagnosis not present

## 2010-12-08 DIAGNOSIS — R5383 Other fatigue: Secondary | ICD-10-CM | POA: Diagnosis not present

## 2010-12-08 DIAGNOSIS — I4891 Unspecified atrial fibrillation: Secondary | ICD-10-CM | POA: Diagnosis not present

## 2010-12-10 DIAGNOSIS — J441 Chronic obstructive pulmonary disease with (acute) exacerbation: Secondary | ICD-10-CM | POA: Diagnosis not present

## 2010-12-10 DIAGNOSIS — R5383 Other fatigue: Secondary | ICD-10-CM | POA: Diagnosis not present

## 2010-12-10 DIAGNOSIS — Z48812 Encounter for surgical aftercare following surgery on the circulatory system: Secondary | ICD-10-CM | POA: Diagnosis not present

## 2010-12-10 DIAGNOSIS — I4891 Unspecified atrial fibrillation: Secondary | ICD-10-CM | POA: Diagnosis not present

## 2010-12-10 DIAGNOSIS — E119 Type 2 diabetes mellitus without complications: Secondary | ICD-10-CM | POA: Diagnosis not present

## 2010-12-10 DIAGNOSIS — I10 Essential (primary) hypertension: Secondary | ICD-10-CM | POA: Diagnosis not present

## 2010-12-11 DIAGNOSIS — Z48812 Encounter for surgical aftercare following surgery on the circulatory system: Secondary | ICD-10-CM | POA: Diagnosis not present

## 2010-12-11 DIAGNOSIS — E119 Type 2 diabetes mellitus without complications: Secondary | ICD-10-CM | POA: Diagnosis not present

## 2010-12-11 DIAGNOSIS — I4891 Unspecified atrial fibrillation: Secondary | ICD-10-CM | POA: Diagnosis not present

## 2010-12-11 DIAGNOSIS — R5381 Other malaise: Secondary | ICD-10-CM | POA: Diagnosis not present

## 2010-12-11 DIAGNOSIS — I10 Essential (primary) hypertension: Secondary | ICD-10-CM | POA: Diagnosis not present

## 2010-12-11 DIAGNOSIS — J441 Chronic obstructive pulmonary disease with (acute) exacerbation: Secondary | ICD-10-CM | POA: Diagnosis not present

## 2010-12-12 DIAGNOSIS — J441 Chronic obstructive pulmonary disease with (acute) exacerbation: Secondary | ICD-10-CM | POA: Diagnosis not present

## 2010-12-12 DIAGNOSIS — I10 Essential (primary) hypertension: Secondary | ICD-10-CM | POA: Diagnosis not present

## 2010-12-12 DIAGNOSIS — E119 Type 2 diabetes mellitus without complications: Secondary | ICD-10-CM | POA: Diagnosis not present

## 2010-12-12 DIAGNOSIS — I4891 Unspecified atrial fibrillation: Secondary | ICD-10-CM | POA: Diagnosis not present

## 2010-12-12 DIAGNOSIS — Z48812 Encounter for surgical aftercare following surgery on the circulatory system: Secondary | ICD-10-CM | POA: Diagnosis not present

## 2010-12-12 DIAGNOSIS — R5383 Other fatigue: Secondary | ICD-10-CM | POA: Diagnosis not present

## 2010-12-15 DIAGNOSIS — J441 Chronic obstructive pulmonary disease with (acute) exacerbation: Secondary | ICD-10-CM | POA: Diagnosis not present

## 2010-12-15 DIAGNOSIS — Z48812 Encounter for surgical aftercare following surgery on the circulatory system: Secondary | ICD-10-CM | POA: Diagnosis not present

## 2010-12-15 DIAGNOSIS — R5381 Other malaise: Secondary | ICD-10-CM | POA: Diagnosis not present

## 2010-12-15 DIAGNOSIS — I4891 Unspecified atrial fibrillation: Secondary | ICD-10-CM | POA: Diagnosis not present

## 2010-12-15 DIAGNOSIS — I10 Essential (primary) hypertension: Secondary | ICD-10-CM | POA: Diagnosis not present

## 2010-12-15 DIAGNOSIS — E119 Type 2 diabetes mellitus without complications: Secondary | ICD-10-CM | POA: Diagnosis not present

## 2010-12-17 DIAGNOSIS — I4891 Unspecified atrial fibrillation: Secondary | ICD-10-CM | POA: Diagnosis not present

## 2010-12-17 DIAGNOSIS — R5383 Other fatigue: Secondary | ICD-10-CM | POA: Diagnosis not present

## 2010-12-17 DIAGNOSIS — I10 Essential (primary) hypertension: Secondary | ICD-10-CM | POA: Diagnosis not present

## 2010-12-17 DIAGNOSIS — E119 Type 2 diabetes mellitus without complications: Secondary | ICD-10-CM | POA: Diagnosis not present

## 2010-12-17 DIAGNOSIS — Z48812 Encounter for surgical aftercare following surgery on the circulatory system: Secondary | ICD-10-CM | POA: Diagnosis not present

## 2010-12-17 DIAGNOSIS — J441 Chronic obstructive pulmonary disease with (acute) exacerbation: Secondary | ICD-10-CM | POA: Diagnosis not present

## 2010-12-19 DIAGNOSIS — I4891 Unspecified atrial fibrillation: Secondary | ICD-10-CM | POA: Diagnosis not present

## 2010-12-19 DIAGNOSIS — I10 Essential (primary) hypertension: Secondary | ICD-10-CM | POA: Diagnosis not present

## 2010-12-19 DIAGNOSIS — Z48812 Encounter for surgical aftercare following surgery on the circulatory system: Secondary | ICD-10-CM | POA: Diagnosis not present

## 2010-12-19 DIAGNOSIS — E119 Type 2 diabetes mellitus without complications: Secondary | ICD-10-CM | POA: Diagnosis not present

## 2010-12-19 DIAGNOSIS — R5383 Other fatigue: Secondary | ICD-10-CM | POA: Diagnosis not present

## 2010-12-19 DIAGNOSIS — J441 Chronic obstructive pulmonary disease with (acute) exacerbation: Secondary | ICD-10-CM | POA: Diagnosis not present

## 2010-12-23 DIAGNOSIS — Z48812 Encounter for surgical aftercare following surgery on the circulatory system: Secondary | ICD-10-CM | POA: Diagnosis not present

## 2010-12-23 DIAGNOSIS — E119 Type 2 diabetes mellitus without complications: Secondary | ICD-10-CM | POA: Diagnosis not present

## 2010-12-23 DIAGNOSIS — R5383 Other fatigue: Secondary | ICD-10-CM | POA: Diagnosis not present

## 2010-12-23 DIAGNOSIS — I10 Essential (primary) hypertension: Secondary | ICD-10-CM | POA: Diagnosis not present

## 2010-12-23 DIAGNOSIS — J441 Chronic obstructive pulmonary disease with (acute) exacerbation: Secondary | ICD-10-CM | POA: Diagnosis not present

## 2010-12-23 DIAGNOSIS — I4891 Unspecified atrial fibrillation: Secondary | ICD-10-CM | POA: Diagnosis not present

## 2010-12-25 DIAGNOSIS — I10 Essential (primary) hypertension: Secondary | ICD-10-CM | POA: Diagnosis not present

## 2010-12-25 DIAGNOSIS — I4891 Unspecified atrial fibrillation: Secondary | ICD-10-CM | POA: Diagnosis not present

## 2010-12-25 DIAGNOSIS — J441 Chronic obstructive pulmonary disease with (acute) exacerbation: Secondary | ICD-10-CM | POA: Diagnosis not present

## 2010-12-25 DIAGNOSIS — E119 Type 2 diabetes mellitus without complications: Secondary | ICD-10-CM | POA: Diagnosis not present

## 2010-12-25 DIAGNOSIS — R5383 Other fatigue: Secondary | ICD-10-CM | POA: Diagnosis not present

## 2010-12-25 DIAGNOSIS — Z48812 Encounter for surgical aftercare following surgery on the circulatory system: Secondary | ICD-10-CM | POA: Diagnosis not present

## 2010-12-30 DIAGNOSIS — J441 Chronic obstructive pulmonary disease with (acute) exacerbation: Secondary | ICD-10-CM | POA: Diagnosis not present

## 2010-12-30 DIAGNOSIS — E119 Type 2 diabetes mellitus without complications: Secondary | ICD-10-CM | POA: Diagnosis not present

## 2010-12-30 DIAGNOSIS — R5383 Other fatigue: Secondary | ICD-10-CM | POA: Diagnosis not present

## 2010-12-30 DIAGNOSIS — I4891 Unspecified atrial fibrillation: Secondary | ICD-10-CM | POA: Diagnosis not present

## 2010-12-30 DIAGNOSIS — I10 Essential (primary) hypertension: Secondary | ICD-10-CM | POA: Diagnosis not present

## 2010-12-30 DIAGNOSIS — Z48812 Encounter for surgical aftercare following surgery on the circulatory system: Secondary | ICD-10-CM | POA: Diagnosis not present

## 2011-01-01 DIAGNOSIS — E119 Type 2 diabetes mellitus without complications: Secondary | ICD-10-CM | POA: Diagnosis not present

## 2011-01-01 DIAGNOSIS — I4891 Unspecified atrial fibrillation: Secondary | ICD-10-CM | POA: Diagnosis not present

## 2011-01-01 DIAGNOSIS — J441 Chronic obstructive pulmonary disease with (acute) exacerbation: Secondary | ICD-10-CM | POA: Diagnosis not present

## 2011-01-01 DIAGNOSIS — I10 Essential (primary) hypertension: Secondary | ICD-10-CM | POA: Diagnosis not present

## 2011-01-01 DIAGNOSIS — R5381 Other malaise: Secondary | ICD-10-CM | POA: Diagnosis not present

## 2011-01-01 DIAGNOSIS — Z48812 Encounter for surgical aftercare following surgery on the circulatory system: Secondary | ICD-10-CM | POA: Diagnosis not present

## 2011-01-02 DIAGNOSIS — R5381 Other malaise: Secondary | ICD-10-CM | POA: Diagnosis not present

## 2011-01-02 DIAGNOSIS — E119 Type 2 diabetes mellitus without complications: Secondary | ICD-10-CM | POA: Diagnosis not present

## 2011-01-02 DIAGNOSIS — J441 Chronic obstructive pulmonary disease with (acute) exacerbation: Secondary | ICD-10-CM | POA: Diagnosis not present

## 2011-01-02 DIAGNOSIS — Z48812 Encounter for surgical aftercare following surgery on the circulatory system: Secondary | ICD-10-CM | POA: Diagnosis not present

## 2011-01-02 DIAGNOSIS — I10 Essential (primary) hypertension: Secondary | ICD-10-CM | POA: Diagnosis not present

## 2011-01-02 DIAGNOSIS — I4891 Unspecified atrial fibrillation: Secondary | ICD-10-CM | POA: Diagnosis not present

## 2011-01-07 DIAGNOSIS — J441 Chronic obstructive pulmonary disease with (acute) exacerbation: Secondary | ICD-10-CM | POA: Diagnosis not present

## 2011-01-07 DIAGNOSIS — Z48812 Encounter for surgical aftercare following surgery on the circulatory system: Secondary | ICD-10-CM | POA: Diagnosis not present

## 2011-01-07 DIAGNOSIS — E119 Type 2 diabetes mellitus without complications: Secondary | ICD-10-CM | POA: Diagnosis not present

## 2011-01-07 DIAGNOSIS — I4891 Unspecified atrial fibrillation: Secondary | ICD-10-CM | POA: Diagnosis not present

## 2011-01-07 DIAGNOSIS — I10 Essential (primary) hypertension: Secondary | ICD-10-CM | POA: Diagnosis not present

## 2011-01-07 DIAGNOSIS — R5381 Other malaise: Secondary | ICD-10-CM | POA: Diagnosis not present

## 2011-01-09 DIAGNOSIS — I4891 Unspecified atrial fibrillation: Secondary | ICD-10-CM | POA: Diagnosis not present

## 2011-01-09 DIAGNOSIS — J441 Chronic obstructive pulmonary disease with (acute) exacerbation: Secondary | ICD-10-CM | POA: Diagnosis not present

## 2011-01-09 DIAGNOSIS — R5381 Other malaise: Secondary | ICD-10-CM | POA: Diagnosis not present

## 2011-01-09 DIAGNOSIS — E119 Type 2 diabetes mellitus without complications: Secondary | ICD-10-CM | POA: Diagnosis not present

## 2011-01-09 DIAGNOSIS — I10 Essential (primary) hypertension: Secondary | ICD-10-CM | POA: Diagnosis not present

## 2011-01-09 DIAGNOSIS — Z48812 Encounter for surgical aftercare following surgery on the circulatory system: Secondary | ICD-10-CM | POA: Diagnosis not present

## 2011-01-12 DIAGNOSIS — E119 Type 2 diabetes mellitus without complications: Secondary | ICD-10-CM | POA: Diagnosis not present

## 2011-01-12 DIAGNOSIS — I4891 Unspecified atrial fibrillation: Secondary | ICD-10-CM | POA: Diagnosis not present

## 2011-01-12 DIAGNOSIS — J441 Chronic obstructive pulmonary disease with (acute) exacerbation: Secondary | ICD-10-CM | POA: Diagnosis not present

## 2011-01-12 DIAGNOSIS — R5383 Other fatigue: Secondary | ICD-10-CM | POA: Diagnosis not present

## 2011-01-12 DIAGNOSIS — Z48812 Encounter for surgical aftercare following surgery on the circulatory system: Secondary | ICD-10-CM | POA: Diagnosis not present

## 2011-01-12 DIAGNOSIS — I10 Essential (primary) hypertension: Secondary | ICD-10-CM | POA: Diagnosis not present

## 2011-01-15 DIAGNOSIS — J441 Chronic obstructive pulmonary disease with (acute) exacerbation: Secondary | ICD-10-CM | POA: Diagnosis not present

## 2011-01-15 DIAGNOSIS — E119 Type 2 diabetes mellitus without complications: Secondary | ICD-10-CM | POA: Diagnosis not present

## 2011-01-15 DIAGNOSIS — R5381 Other malaise: Secondary | ICD-10-CM | POA: Diagnosis not present

## 2011-01-15 DIAGNOSIS — Z48812 Encounter for surgical aftercare following surgery on the circulatory system: Secondary | ICD-10-CM | POA: Diagnosis not present

## 2011-01-15 DIAGNOSIS — I4891 Unspecified atrial fibrillation: Secondary | ICD-10-CM | POA: Diagnosis not present

## 2011-01-15 DIAGNOSIS — I10 Essential (primary) hypertension: Secondary | ICD-10-CM | POA: Diagnosis not present

## 2011-01-21 DIAGNOSIS — J441 Chronic obstructive pulmonary disease with (acute) exacerbation: Secondary | ICD-10-CM | POA: Diagnosis not present

## 2011-01-21 DIAGNOSIS — I4891 Unspecified atrial fibrillation: Secondary | ICD-10-CM | POA: Diagnosis not present

## 2011-01-21 DIAGNOSIS — Z48812 Encounter for surgical aftercare following surgery on the circulatory system: Secondary | ICD-10-CM | POA: Diagnosis not present

## 2011-01-21 DIAGNOSIS — I10 Essential (primary) hypertension: Secondary | ICD-10-CM | POA: Diagnosis not present

## 2011-01-21 DIAGNOSIS — E119 Type 2 diabetes mellitus without complications: Secondary | ICD-10-CM | POA: Diagnosis not present

## 2011-01-21 DIAGNOSIS — R5381 Other malaise: Secondary | ICD-10-CM | POA: Diagnosis not present

## 2011-01-27 DIAGNOSIS — Z48812 Encounter for surgical aftercare following surgery on the circulatory system: Secondary | ICD-10-CM | POA: Diagnosis not present

## 2011-01-27 DIAGNOSIS — I10 Essential (primary) hypertension: Secondary | ICD-10-CM | POA: Diagnosis not present

## 2011-01-27 DIAGNOSIS — J441 Chronic obstructive pulmonary disease with (acute) exacerbation: Secondary | ICD-10-CM | POA: Diagnosis not present

## 2011-01-27 DIAGNOSIS — I4891 Unspecified atrial fibrillation: Secondary | ICD-10-CM | POA: Diagnosis not present

## 2011-01-27 DIAGNOSIS — R5383 Other fatigue: Secondary | ICD-10-CM | POA: Diagnosis not present

## 2011-01-27 DIAGNOSIS — R5381 Other malaise: Secondary | ICD-10-CM | POA: Diagnosis not present

## 2011-01-27 DIAGNOSIS — E119 Type 2 diabetes mellitus without complications: Secondary | ICD-10-CM | POA: Diagnosis not present

## 2011-01-28 DIAGNOSIS — E291 Testicular hypofunction: Secondary | ICD-10-CM | POA: Diagnosis not present

## 2011-01-28 DIAGNOSIS — G4733 Obstructive sleep apnea (adult) (pediatric): Secondary | ICD-10-CM | POA: Diagnosis not present

## 2011-01-28 DIAGNOSIS — I10 Essential (primary) hypertension: Secondary | ICD-10-CM | POA: Diagnosis not present

## 2011-01-28 DIAGNOSIS — I4891 Unspecified atrial fibrillation: Secondary | ICD-10-CM | POA: Diagnosis not present

## 2011-01-28 DIAGNOSIS — J441 Chronic obstructive pulmonary disease with (acute) exacerbation: Secondary | ICD-10-CM | POA: Diagnosis not present

## 2011-01-28 DIAGNOSIS — E119 Type 2 diabetes mellitus without complications: Secondary | ICD-10-CM | POA: Diagnosis not present

## 2011-02-04 DIAGNOSIS — G4733 Obstructive sleep apnea (adult) (pediatric): Secondary | ICD-10-CM | POA: Diagnosis not present

## 2011-02-04 DIAGNOSIS — I4891 Unspecified atrial fibrillation: Secondary | ICD-10-CM | POA: Diagnosis not present

## 2011-02-04 DIAGNOSIS — E119 Type 2 diabetes mellitus without complications: Secondary | ICD-10-CM | POA: Diagnosis not present

## 2011-02-04 DIAGNOSIS — E291 Testicular hypofunction: Secondary | ICD-10-CM | POA: Diagnosis not present

## 2011-02-04 DIAGNOSIS — I10 Essential (primary) hypertension: Secondary | ICD-10-CM | POA: Diagnosis not present

## 2011-02-04 DIAGNOSIS — J441 Chronic obstructive pulmonary disease with (acute) exacerbation: Secondary | ICD-10-CM | POA: Diagnosis not present

## 2011-02-10 DIAGNOSIS — Z951 Presence of aortocoronary bypass graft: Secondary | ICD-10-CM | POA: Diagnosis not present

## 2011-02-10 DIAGNOSIS — Z5189 Encounter for other specified aftercare: Secondary | ICD-10-CM | POA: Diagnosis not present

## 2011-02-13 DIAGNOSIS — Z7901 Long term (current) use of anticoagulants: Secondary | ICD-10-CM | POA: Diagnosis not present

## 2011-02-16 DIAGNOSIS — I4891 Unspecified atrial fibrillation: Secondary | ICD-10-CM | POA: Diagnosis not present

## 2011-02-16 DIAGNOSIS — I2510 Atherosclerotic heart disease of native coronary artery without angina pectoris: Secondary | ICD-10-CM | POA: Diagnosis not present

## 2011-02-16 DIAGNOSIS — R0602 Shortness of breath: Secondary | ICD-10-CM | POA: Diagnosis not present

## 2011-02-19 DIAGNOSIS — J449 Chronic obstructive pulmonary disease, unspecified: Secondary | ICD-10-CM | POA: Diagnosis not present

## 2011-02-19 DIAGNOSIS — R0989 Other specified symptoms and signs involving the circulatory and respiratory systems: Secondary | ICD-10-CM | POA: Diagnosis not present

## 2011-02-19 DIAGNOSIS — R05 Cough: Secondary | ICD-10-CM | POA: Diagnosis not present

## 2011-02-19 DIAGNOSIS — R0609 Other forms of dyspnea: Secondary | ICD-10-CM | POA: Diagnosis not present

## 2011-02-19 DIAGNOSIS — J45909 Unspecified asthma, uncomplicated: Secondary | ICD-10-CM | POA: Diagnosis not present

## 2011-02-19 DIAGNOSIS — G4730 Sleep apnea, unspecified: Secondary | ICD-10-CM | POA: Diagnosis not present

## 2011-02-24 DIAGNOSIS — I4891 Unspecified atrial fibrillation: Secondary | ICD-10-CM | POA: Diagnosis not present

## 2011-02-24 DIAGNOSIS — I2510 Atherosclerotic heart disease of native coronary artery without angina pectoris: Secondary | ICD-10-CM | POA: Diagnosis not present

## 2011-02-25 DIAGNOSIS — I4891 Unspecified atrial fibrillation: Secondary | ICD-10-CM | POA: Diagnosis not present

## 2011-02-26 DIAGNOSIS — E236 Other disorders of pituitary gland: Secondary | ICD-10-CM | POA: Diagnosis not present

## 2011-03-04 DIAGNOSIS — Z951 Presence of aortocoronary bypass graft: Secondary | ICD-10-CM | POA: Diagnosis not present

## 2011-03-04 DIAGNOSIS — Z5189 Encounter for other specified aftercare: Secondary | ICD-10-CM | POA: Diagnosis not present

## 2011-03-06 DIAGNOSIS — Z951 Presence of aortocoronary bypass graft: Secondary | ICD-10-CM | POA: Diagnosis not present

## 2011-03-06 DIAGNOSIS — Z5189 Encounter for other specified aftercare: Secondary | ICD-10-CM | POA: Diagnosis not present

## 2011-03-11 DIAGNOSIS — Z951 Presence of aortocoronary bypass graft: Secondary | ICD-10-CM | POA: Diagnosis not present

## 2011-03-11 DIAGNOSIS — Z5189 Encounter for other specified aftercare: Secondary | ICD-10-CM | POA: Diagnosis not present

## 2011-03-12 DIAGNOSIS — E236 Other disorders of pituitary gland: Secondary | ICD-10-CM | POA: Diagnosis not present

## 2011-03-13 DIAGNOSIS — Z951 Presence of aortocoronary bypass graft: Secondary | ICD-10-CM | POA: Diagnosis not present

## 2011-03-13 DIAGNOSIS — Z5189 Encounter for other specified aftercare: Secondary | ICD-10-CM | POA: Diagnosis not present

## 2011-03-16 DIAGNOSIS — Z951 Presence of aortocoronary bypass graft: Secondary | ICD-10-CM | POA: Diagnosis not present

## 2011-03-16 DIAGNOSIS — Z5189 Encounter for other specified aftercare: Secondary | ICD-10-CM | POA: Diagnosis not present

## 2011-03-18 DIAGNOSIS — Z951 Presence of aortocoronary bypass graft: Secondary | ICD-10-CM | POA: Diagnosis not present

## 2011-03-18 DIAGNOSIS — Z5189 Encounter for other specified aftercare: Secondary | ICD-10-CM | POA: Diagnosis not present

## 2011-03-19 DIAGNOSIS — R05 Cough: Secondary | ICD-10-CM | POA: Diagnosis not present

## 2011-03-19 DIAGNOSIS — G4730 Sleep apnea, unspecified: Secondary | ICD-10-CM | POA: Diagnosis not present

## 2011-03-19 DIAGNOSIS — R0602 Shortness of breath: Secondary | ICD-10-CM | POA: Diagnosis not present

## 2011-03-20 DIAGNOSIS — Z951 Presence of aortocoronary bypass graft: Secondary | ICD-10-CM | POA: Diagnosis not present

## 2011-03-20 DIAGNOSIS — Z5189 Encounter for other specified aftercare: Secondary | ICD-10-CM | POA: Diagnosis not present

## 2011-03-23 DIAGNOSIS — Z951 Presence of aortocoronary bypass graft: Secondary | ICD-10-CM | POA: Diagnosis not present

## 2011-03-23 DIAGNOSIS — Z5189 Encounter for other specified aftercare: Secondary | ICD-10-CM | POA: Diagnosis not present

## 2011-03-24 DIAGNOSIS — Z7901 Long term (current) use of anticoagulants: Secondary | ICD-10-CM | POA: Diagnosis not present

## 2011-03-25 DIAGNOSIS — Z5189 Encounter for other specified aftercare: Secondary | ICD-10-CM | POA: Diagnosis not present

## 2011-03-25 DIAGNOSIS — Z951 Presence of aortocoronary bypass graft: Secondary | ICD-10-CM | POA: Diagnosis not present

## 2011-03-26 DIAGNOSIS — E236 Other disorders of pituitary gland: Secondary | ICD-10-CM | POA: Diagnosis not present

## 2011-03-30 DIAGNOSIS — Z951 Presence of aortocoronary bypass graft: Secondary | ICD-10-CM | POA: Diagnosis not present

## 2011-03-30 DIAGNOSIS — Z5189 Encounter for other specified aftercare: Secondary | ICD-10-CM | POA: Diagnosis not present

## 2011-04-01 DIAGNOSIS — Z951 Presence of aortocoronary bypass graft: Secondary | ICD-10-CM | POA: Diagnosis not present

## 2011-04-01 DIAGNOSIS — Z5189 Encounter for other specified aftercare: Secondary | ICD-10-CM | POA: Diagnosis not present

## 2011-04-03 DIAGNOSIS — Z5189 Encounter for other specified aftercare: Secondary | ICD-10-CM | POA: Diagnosis not present

## 2011-04-03 DIAGNOSIS — Z951 Presence of aortocoronary bypass graft: Secondary | ICD-10-CM | POA: Diagnosis not present

## 2011-04-06 DIAGNOSIS — Z5189 Encounter for other specified aftercare: Secondary | ICD-10-CM | POA: Diagnosis not present

## 2011-04-06 DIAGNOSIS — Z951 Presence of aortocoronary bypass graft: Secondary | ICD-10-CM | POA: Diagnosis not present

## 2011-04-08 DIAGNOSIS — Z951 Presence of aortocoronary bypass graft: Secondary | ICD-10-CM | POA: Diagnosis not present

## 2011-04-08 DIAGNOSIS — Z5189 Encounter for other specified aftercare: Secondary | ICD-10-CM | POA: Diagnosis not present

## 2011-04-09 DIAGNOSIS — E236 Other disorders of pituitary gland: Secondary | ICD-10-CM | POA: Diagnosis not present

## 2011-04-09 DIAGNOSIS — L608 Other nail disorders: Secondary | ICD-10-CM | POA: Diagnosis not present

## 2011-04-09 DIAGNOSIS — E1149 Type 2 diabetes mellitus with other diabetic neurological complication: Secondary | ICD-10-CM | POA: Diagnosis not present

## 2011-04-13 DIAGNOSIS — Z951 Presence of aortocoronary bypass graft: Secondary | ICD-10-CM | POA: Diagnosis not present

## 2011-04-13 DIAGNOSIS — Z5189 Encounter for other specified aftercare: Secondary | ICD-10-CM | POA: Diagnosis not present

## 2011-04-15 DIAGNOSIS — Z5189 Encounter for other specified aftercare: Secondary | ICD-10-CM | POA: Diagnosis not present

## 2011-04-15 DIAGNOSIS — Z951 Presence of aortocoronary bypass graft: Secondary | ICD-10-CM | POA: Diagnosis not present

## 2011-04-20 DIAGNOSIS — G4730 Sleep apnea, unspecified: Secondary | ICD-10-CM | POA: Diagnosis not present

## 2011-04-20 DIAGNOSIS — Z5189 Encounter for other specified aftercare: Secondary | ICD-10-CM | POA: Diagnosis not present

## 2011-04-20 DIAGNOSIS — Z951 Presence of aortocoronary bypass graft: Secondary | ICD-10-CM | POA: Diagnosis not present

## 2011-04-22 DIAGNOSIS — Z951 Presence of aortocoronary bypass graft: Secondary | ICD-10-CM | POA: Diagnosis not present

## 2011-04-22 DIAGNOSIS — Z5189 Encounter for other specified aftercare: Secondary | ICD-10-CM | POA: Diagnosis not present

## 2011-04-27 DIAGNOSIS — Z79899 Other long term (current) drug therapy: Secondary | ICD-10-CM | POA: Diagnosis not present

## 2011-04-27 DIAGNOSIS — E291 Testicular hypofunction: Secondary | ICD-10-CM | POA: Diagnosis not present

## 2011-04-27 DIAGNOSIS — Z951 Presence of aortocoronary bypass graft: Secondary | ICD-10-CM | POA: Diagnosis not present

## 2011-04-27 DIAGNOSIS — Z5189 Encounter for other specified aftercare: Secondary | ICD-10-CM | POA: Diagnosis not present

## 2011-04-27 DIAGNOSIS — E119 Type 2 diabetes mellitus without complications: Secondary | ICD-10-CM | POA: Diagnosis not present

## 2011-04-27 DIAGNOSIS — E039 Hypothyroidism, unspecified: Secondary | ICD-10-CM | POA: Diagnosis not present

## 2011-04-27 DIAGNOSIS — Z125 Encounter for screening for malignant neoplasm of prostate: Secondary | ICD-10-CM | POA: Diagnosis not present

## 2011-04-27 DIAGNOSIS — E236 Other disorders of pituitary gland: Secondary | ICD-10-CM | POA: Diagnosis not present

## 2011-04-29 DIAGNOSIS — Z951 Presence of aortocoronary bypass graft: Secondary | ICD-10-CM | POA: Diagnosis not present

## 2011-04-29 DIAGNOSIS — Z5189 Encounter for other specified aftercare: Secondary | ICD-10-CM | POA: Diagnosis not present

## 2011-04-30 DIAGNOSIS — E1149 Type 2 diabetes mellitus with other diabetic neurological complication: Secondary | ICD-10-CM | POA: Diagnosis not present

## 2011-04-30 DIAGNOSIS — Z79899 Other long term (current) drug therapy: Secondary | ICD-10-CM | POA: Diagnosis not present

## 2011-04-30 DIAGNOSIS — E1142 Type 2 diabetes mellitus with diabetic polyneuropathy: Secondary | ICD-10-CM | POA: Diagnosis not present

## 2011-04-30 DIAGNOSIS — E236 Other disorders of pituitary gland: Secondary | ICD-10-CM | POA: Diagnosis not present

## 2011-04-30 DIAGNOSIS — E669 Obesity, unspecified: Secondary | ICD-10-CM | POA: Diagnosis not present

## 2011-04-30 DIAGNOSIS — E039 Hypothyroidism, unspecified: Secondary | ICD-10-CM | POA: Diagnosis not present

## 2011-05-01 DIAGNOSIS — Z5189 Encounter for other specified aftercare: Secondary | ICD-10-CM | POA: Diagnosis not present

## 2011-05-01 DIAGNOSIS — Z951 Presence of aortocoronary bypass graft: Secondary | ICD-10-CM | POA: Diagnosis not present

## 2011-05-04 DIAGNOSIS — Z5189 Encounter for other specified aftercare: Secondary | ICD-10-CM | POA: Diagnosis not present

## 2011-05-04 DIAGNOSIS — Z951 Presence of aortocoronary bypass graft: Secondary | ICD-10-CM | POA: Diagnosis not present

## 2011-05-06 DIAGNOSIS — Z951 Presence of aortocoronary bypass graft: Secondary | ICD-10-CM | POA: Diagnosis not present

## 2011-05-06 DIAGNOSIS — Z5189 Encounter for other specified aftercare: Secondary | ICD-10-CM | POA: Diagnosis not present

## 2011-05-07 DIAGNOSIS — E236 Other disorders of pituitary gland: Secondary | ICD-10-CM | POA: Diagnosis not present

## 2011-05-08 DIAGNOSIS — Z5189 Encounter for other specified aftercare: Secondary | ICD-10-CM | POA: Diagnosis not present

## 2011-05-08 DIAGNOSIS — Z951 Presence of aortocoronary bypass graft: Secondary | ICD-10-CM | POA: Diagnosis not present

## 2011-05-11 DIAGNOSIS — Z951 Presence of aortocoronary bypass graft: Secondary | ICD-10-CM | POA: Diagnosis not present

## 2011-05-11 DIAGNOSIS — Z5189 Encounter for other specified aftercare: Secondary | ICD-10-CM | POA: Diagnosis not present

## 2011-05-12 DIAGNOSIS — R7989 Other specified abnormal findings of blood chemistry: Secondary | ICD-10-CM | POA: Diagnosis not present

## 2011-05-12 DIAGNOSIS — R0602 Shortness of breath: Secondary | ICD-10-CM | POA: Diagnosis not present

## 2011-05-12 DIAGNOSIS — J45909 Unspecified asthma, uncomplicated: Secondary | ICD-10-CM | POA: Diagnosis not present

## 2011-05-12 DIAGNOSIS — G4733 Obstructive sleep apnea (adult) (pediatric): Secondary | ICD-10-CM | POA: Diagnosis not present

## 2011-05-12 DIAGNOSIS — G2581 Restless legs syndrome: Secondary | ICD-10-CM | POA: Diagnosis not present

## 2011-05-12 DIAGNOSIS — D649 Anemia, unspecified: Secondary | ICD-10-CM | POA: Diagnosis not present

## 2011-05-13 DIAGNOSIS — Z951 Presence of aortocoronary bypass graft: Secondary | ICD-10-CM | POA: Diagnosis not present

## 2011-05-13 DIAGNOSIS — Z5189 Encounter for other specified aftercare: Secondary | ICD-10-CM | POA: Diagnosis not present

## 2011-05-15 DIAGNOSIS — Z5189 Encounter for other specified aftercare: Secondary | ICD-10-CM | POA: Diagnosis not present

## 2011-05-15 DIAGNOSIS — Z951 Presence of aortocoronary bypass graft: Secondary | ICD-10-CM | POA: Diagnosis not present

## 2011-05-20 DIAGNOSIS — Z5189 Encounter for other specified aftercare: Secondary | ICD-10-CM | POA: Diagnosis not present

## 2011-05-20 DIAGNOSIS — H908 Mixed conductive and sensorineural hearing loss, unspecified: Secondary | ICD-10-CM | POA: Diagnosis not present

## 2011-05-20 DIAGNOSIS — H905 Unspecified sensorineural hearing loss: Secondary | ICD-10-CM | POA: Diagnosis not present

## 2011-05-20 DIAGNOSIS — Z951 Presence of aortocoronary bypass graft: Secondary | ICD-10-CM | POA: Diagnosis not present

## 2011-05-20 DIAGNOSIS — H6980 Other specified disorders of Eustachian tube, unspecified ear: Secondary | ICD-10-CM | POA: Diagnosis not present

## 2011-05-21 DIAGNOSIS — E236 Other disorders of pituitary gland: Secondary | ICD-10-CM | POA: Diagnosis not present

## 2011-05-22 DIAGNOSIS — Z5189 Encounter for other specified aftercare: Secondary | ICD-10-CM | POA: Diagnosis not present

## 2011-05-22 DIAGNOSIS — Z951 Presence of aortocoronary bypass graft: Secondary | ICD-10-CM | POA: Diagnosis not present

## 2011-05-25 DIAGNOSIS — Z5189 Encounter for other specified aftercare: Secondary | ICD-10-CM | POA: Diagnosis not present

## 2011-05-25 DIAGNOSIS — Z951 Presence of aortocoronary bypass graft: Secondary | ICD-10-CM | POA: Diagnosis not present

## 2011-05-27 DIAGNOSIS — I4891 Unspecified atrial fibrillation: Secondary | ICD-10-CM | POA: Diagnosis not present

## 2011-05-27 DIAGNOSIS — H2589 Other age-related cataract: Secondary | ICD-10-CM | POA: Diagnosis not present

## 2011-06-01 DIAGNOSIS — R5383 Other fatigue: Secondary | ICD-10-CM | POA: Diagnosis not present

## 2011-06-01 DIAGNOSIS — R5381 Other malaise: Secondary | ICD-10-CM | POA: Diagnosis not present

## 2011-06-01 DIAGNOSIS — Z7901 Long term (current) use of anticoagulants: Secondary | ICD-10-CM | POA: Diagnosis not present

## 2011-06-04 DIAGNOSIS — E236 Other disorders of pituitary gland: Secondary | ICD-10-CM | POA: Diagnosis not present

## 2011-06-11 DIAGNOSIS — H903 Sensorineural hearing loss, bilateral: Secondary | ICD-10-CM | POA: Diagnosis not present

## 2011-06-11 DIAGNOSIS — H6980 Other specified disorders of Eustachian tube, unspecified ear: Secondary | ICD-10-CM | POA: Diagnosis not present

## 2011-06-12 DIAGNOSIS — I4891 Unspecified atrial fibrillation: Secondary | ICD-10-CM | POA: Diagnosis not present

## 2011-06-18 DIAGNOSIS — E039 Hypothyroidism, unspecified: Secondary | ICD-10-CM | POA: Diagnosis not present

## 2011-06-18 DIAGNOSIS — E236 Other disorders of pituitary gland: Secondary | ICD-10-CM | POA: Diagnosis not present

## 2011-06-19 DIAGNOSIS — R5383 Other fatigue: Secondary | ICD-10-CM | POA: Diagnosis not present

## 2011-06-19 DIAGNOSIS — R5381 Other malaise: Secondary | ICD-10-CM | POA: Diagnosis not present

## 2011-06-25 DIAGNOSIS — R5383 Other fatigue: Secondary | ICD-10-CM | POA: Diagnosis not present

## 2011-06-25 DIAGNOSIS — E785 Hyperlipidemia, unspecified: Secondary | ICD-10-CM | POA: Diagnosis not present

## 2011-06-25 DIAGNOSIS — R5381 Other malaise: Secondary | ICD-10-CM | POA: Diagnosis not present

## 2011-06-25 DIAGNOSIS — I2510 Atherosclerotic heart disease of native coronary artery without angina pectoris: Secondary | ICD-10-CM | POA: Diagnosis not present

## 2011-07-02 DIAGNOSIS — L608 Other nail disorders: Secondary | ICD-10-CM | POA: Diagnosis not present

## 2011-07-02 DIAGNOSIS — E236 Other disorders of pituitary gland: Secondary | ICD-10-CM | POA: Diagnosis not present

## 2011-07-02 DIAGNOSIS — E1149 Type 2 diabetes mellitus with other diabetic neurological complication: Secondary | ICD-10-CM | POA: Diagnosis not present

## 2011-07-14 DIAGNOSIS — R0602 Shortness of breath: Secondary | ICD-10-CM | POA: Diagnosis not present

## 2011-07-14 DIAGNOSIS — G4733 Obstructive sleep apnea (adult) (pediatric): Secondary | ICD-10-CM | POA: Diagnosis not present

## 2011-07-14 DIAGNOSIS — R05 Cough: Secondary | ICD-10-CM | POA: Diagnosis not present

## 2011-07-14 DIAGNOSIS — J984 Other disorders of lung: Secondary | ICD-10-CM | POA: Diagnosis not present

## 2011-07-17 DIAGNOSIS — E236 Other disorders of pituitary gland: Secondary | ICD-10-CM | POA: Diagnosis not present

## 2011-07-29 DIAGNOSIS — E236 Other disorders of pituitary gland: Secondary | ICD-10-CM | POA: Diagnosis not present

## 2011-08-04 DIAGNOSIS — E669 Obesity, unspecified: Secondary | ICD-10-CM | POA: Diagnosis not present

## 2011-08-04 DIAGNOSIS — E236 Other disorders of pituitary gland: Secondary | ICD-10-CM | POA: Diagnosis not present

## 2011-08-04 DIAGNOSIS — E1142 Type 2 diabetes mellitus with diabetic polyneuropathy: Secondary | ICD-10-CM | POA: Diagnosis not present

## 2011-08-04 DIAGNOSIS — E039 Hypothyroidism, unspecified: Secondary | ICD-10-CM | POA: Diagnosis not present

## 2011-08-04 DIAGNOSIS — E1149 Type 2 diabetes mellitus with other diabetic neurological complication: Secondary | ICD-10-CM | POA: Diagnosis not present

## 2011-08-04 DIAGNOSIS — Z79899 Other long term (current) drug therapy: Secondary | ICD-10-CM | POA: Diagnosis not present

## 2011-08-13 DIAGNOSIS — E236 Other disorders of pituitary gland: Secondary | ICD-10-CM | POA: Diagnosis not present

## 2011-08-25 DIAGNOSIS — J309 Allergic rhinitis, unspecified: Secondary | ICD-10-CM | POA: Diagnosis not present

## 2011-08-25 DIAGNOSIS — K219 Gastro-esophageal reflux disease without esophagitis: Secondary | ICD-10-CM | POA: Diagnosis not present

## 2011-08-25 DIAGNOSIS — G4730 Sleep apnea, unspecified: Secondary | ICD-10-CM | POA: Diagnosis not present

## 2011-08-25 DIAGNOSIS — G4733 Obstructive sleep apnea (adult) (pediatric): Secondary | ICD-10-CM | POA: Diagnosis not present

## 2011-08-27 DIAGNOSIS — E236 Other disorders of pituitary gland: Secondary | ICD-10-CM | POA: Diagnosis not present

## 2011-09-01 DIAGNOSIS — I2510 Atherosclerotic heart disease of native coronary artery without angina pectoris: Secondary | ICD-10-CM | POA: Diagnosis not present

## 2011-09-01 DIAGNOSIS — I4891 Unspecified atrial fibrillation: Secondary | ICD-10-CM | POA: Diagnosis not present

## 2011-09-01 DIAGNOSIS — E785 Hyperlipidemia, unspecified: Secondary | ICD-10-CM | POA: Diagnosis not present

## 2011-09-10 DIAGNOSIS — E291 Testicular hypofunction: Secondary | ICD-10-CM | POA: Diagnosis not present

## 2011-09-14 DIAGNOSIS — I2510 Atherosclerotic heart disease of native coronary artery without angina pectoris: Secondary | ICD-10-CM | POA: Diagnosis not present

## 2011-09-24 DIAGNOSIS — E236 Other disorders of pituitary gland: Secondary | ICD-10-CM | POA: Diagnosis not present

## 2011-09-25 DIAGNOSIS — R0989 Other specified symptoms and signs involving the circulatory and respiratory systems: Secondary | ICD-10-CM | POA: Diagnosis not present

## 2011-09-25 DIAGNOSIS — R0602 Shortness of breath: Secondary | ICD-10-CM | POA: Diagnosis not present

## 2011-09-25 DIAGNOSIS — I2510 Atherosclerotic heart disease of native coronary artery without angina pectoris: Secondary | ICD-10-CM | POA: Diagnosis not present

## 2011-09-25 DIAGNOSIS — R0609 Other forms of dyspnea: Secondary | ICD-10-CM | POA: Diagnosis not present

## 2011-09-25 DIAGNOSIS — I4891 Unspecified atrial fibrillation: Secondary | ICD-10-CM | POA: Diagnosis not present

## 2011-10-05 DIAGNOSIS — E785 Hyperlipidemia, unspecified: Secondary | ICD-10-CM | POA: Diagnosis not present

## 2011-10-05 DIAGNOSIS — I2510 Atherosclerotic heart disease of native coronary artery without angina pectoris: Secondary | ICD-10-CM | POA: Diagnosis not present

## 2011-10-05 DIAGNOSIS — I4891 Unspecified atrial fibrillation: Secondary | ICD-10-CM | POA: Diagnosis not present

## 2011-10-06 DIAGNOSIS — Z7901 Long term (current) use of anticoagulants: Secondary | ICD-10-CM | POA: Diagnosis not present

## 2011-10-08 DIAGNOSIS — E236 Other disorders of pituitary gland: Secondary | ICD-10-CM | POA: Diagnosis not present

## 2011-10-15 DIAGNOSIS — L608 Other nail disorders: Secondary | ICD-10-CM | POA: Diagnosis not present

## 2011-10-15 DIAGNOSIS — E1149 Type 2 diabetes mellitus with other diabetic neurological complication: Secondary | ICD-10-CM | POA: Diagnosis not present

## 2011-10-16 DIAGNOSIS — I2510 Atherosclerotic heart disease of native coronary artery without angina pectoris: Secondary | ICD-10-CM | POA: Diagnosis not present

## 2011-10-21 DIAGNOSIS — E236 Other disorders of pituitary gland: Secondary | ICD-10-CM | POA: Diagnosis not present

## 2011-11-02 DIAGNOSIS — E039 Hypothyroidism, unspecified: Secondary | ICD-10-CM | POA: Diagnosis not present

## 2011-11-02 DIAGNOSIS — E1149 Type 2 diabetes mellitus with other diabetic neurological complication: Secondary | ICD-10-CM | POA: Diagnosis not present

## 2011-11-02 DIAGNOSIS — E236 Other disorders of pituitary gland: Secondary | ICD-10-CM | POA: Diagnosis not present

## 2011-11-05 DIAGNOSIS — E236 Other disorders of pituitary gland: Secondary | ICD-10-CM | POA: Diagnosis not present

## 2011-11-05 DIAGNOSIS — E1149 Type 2 diabetes mellitus with other diabetic neurological complication: Secondary | ICD-10-CM | POA: Diagnosis not present

## 2011-11-09 DIAGNOSIS — E236 Other disorders of pituitary gland: Secondary | ICD-10-CM | POA: Diagnosis not present

## 2011-11-09 DIAGNOSIS — R5383 Other fatigue: Secondary | ICD-10-CM | POA: Diagnosis not present

## 2011-11-09 DIAGNOSIS — E1142 Type 2 diabetes mellitus with diabetic polyneuropathy: Secondary | ICD-10-CM | POA: Diagnosis not present

## 2011-11-09 DIAGNOSIS — R5381 Other malaise: Secondary | ICD-10-CM | POA: Diagnosis not present

## 2011-11-09 DIAGNOSIS — Z23 Encounter for immunization: Secondary | ICD-10-CM | POA: Diagnosis not present

## 2011-11-09 DIAGNOSIS — E039 Hypothyroidism, unspecified: Secondary | ICD-10-CM | POA: Diagnosis not present

## 2011-11-09 DIAGNOSIS — E1149 Type 2 diabetes mellitus with other diabetic neurological complication: Secondary | ICD-10-CM | POA: Diagnosis not present

## 2011-11-09 DIAGNOSIS — E669 Obesity, unspecified: Secondary | ICD-10-CM | POA: Diagnosis not present

## 2011-11-09 DIAGNOSIS — Z79899 Other long term (current) drug therapy: Secondary | ICD-10-CM | POA: Diagnosis not present

## 2011-11-16 DIAGNOSIS — R5381 Other malaise: Secondary | ICD-10-CM | POA: Diagnosis not present

## 2011-11-16 DIAGNOSIS — I2510 Atherosclerotic heart disease of native coronary artery without angina pectoris: Secondary | ICD-10-CM | POA: Diagnosis not present

## 2011-11-16 DIAGNOSIS — E785 Hyperlipidemia, unspecified: Secondary | ICD-10-CM | POA: Diagnosis not present

## 2011-11-16 DIAGNOSIS — E230 Hypopituitarism: Secondary | ICD-10-CM | POA: Diagnosis not present

## 2011-11-17 DIAGNOSIS — I8310 Varicose veins of unspecified lower extremity with inflammation: Secondary | ICD-10-CM | POA: Diagnosis not present

## 2011-11-17 DIAGNOSIS — R609 Edema, unspecified: Secondary | ICD-10-CM | POA: Diagnosis not present

## 2011-11-19 DIAGNOSIS — E236 Other disorders of pituitary gland: Secondary | ICD-10-CM | POA: Diagnosis not present

## 2011-12-02 DIAGNOSIS — Z7901 Long term (current) use of anticoagulants: Secondary | ICD-10-CM | POA: Diagnosis not present

## 2011-12-03 DIAGNOSIS — E291 Testicular hypofunction: Secondary | ICD-10-CM | POA: Diagnosis not present

## 2011-12-10 DIAGNOSIS — E236 Other disorders of pituitary gland: Secondary | ICD-10-CM | POA: Diagnosis not present

## 2011-12-10 DIAGNOSIS — Z79899 Other long term (current) drug therapy: Secondary | ICD-10-CM | POA: Diagnosis not present

## 2011-12-10 DIAGNOSIS — E1142 Type 2 diabetes mellitus with diabetic polyneuropathy: Secondary | ICD-10-CM | POA: Diagnosis not present

## 2011-12-10 DIAGNOSIS — E039 Hypothyroidism, unspecified: Secondary | ICD-10-CM | POA: Diagnosis not present

## 2011-12-10 DIAGNOSIS — R5381 Other malaise: Secondary | ICD-10-CM | POA: Diagnosis not present

## 2011-12-10 DIAGNOSIS — E669 Obesity, unspecified: Secondary | ICD-10-CM | POA: Diagnosis not present

## 2011-12-10 DIAGNOSIS — E1149 Type 2 diabetes mellitus with other diabetic neurological complication: Secondary | ICD-10-CM | POA: Diagnosis not present

## 2011-12-10 DIAGNOSIS — R5383 Other fatigue: Secondary | ICD-10-CM | POA: Diagnosis not present

## 2011-12-16 DIAGNOSIS — I4891 Unspecified atrial fibrillation: Secondary | ICD-10-CM | POA: Diagnosis not present

## 2011-12-18 DIAGNOSIS — E236 Other disorders of pituitary gland: Secondary | ICD-10-CM | POA: Diagnosis not present

## 2011-12-21 DIAGNOSIS — H2589 Other age-related cataract: Secondary | ICD-10-CM | POA: Diagnosis not present

## 2011-12-29 DIAGNOSIS — R609 Edema, unspecified: Secondary | ICD-10-CM | POA: Diagnosis not present

## 2011-12-29 DIAGNOSIS — L820 Inflamed seborrheic keratosis: Secondary | ICD-10-CM | POA: Diagnosis not present

## 2011-12-29 DIAGNOSIS — I8310 Varicose veins of unspecified lower extremity with inflammation: Secondary | ICD-10-CM | POA: Diagnosis not present

## 2011-12-30 DIAGNOSIS — I4891 Unspecified atrial fibrillation: Secondary | ICD-10-CM | POA: Diagnosis not present

## 2011-12-31 DIAGNOSIS — E236 Other disorders of pituitary gland: Secondary | ICD-10-CM | POA: Diagnosis not present

## 2012-01-05 DIAGNOSIS — H6980 Other specified disorders of Eustachian tube, unspecified ear: Secondary | ICD-10-CM | POA: Diagnosis not present

## 2012-01-05 DIAGNOSIS — H903 Sensorineural hearing loss, bilateral: Secondary | ICD-10-CM | POA: Diagnosis not present

## 2012-01-08 DIAGNOSIS — H40019 Open angle with borderline findings, low risk, unspecified eye: Secondary | ICD-10-CM | POA: Diagnosis not present

## 2012-01-08 DIAGNOSIS — H2589 Other age-related cataract: Secondary | ICD-10-CM | POA: Diagnosis not present

## 2012-01-11 DIAGNOSIS — E1142 Type 2 diabetes mellitus with diabetic polyneuropathy: Secondary | ICD-10-CM | POA: Diagnosis not present

## 2012-01-11 DIAGNOSIS — E236 Other disorders of pituitary gland: Secondary | ICD-10-CM | POA: Diagnosis not present

## 2012-01-11 DIAGNOSIS — E669 Obesity, unspecified: Secondary | ICD-10-CM | POA: Diagnosis not present

## 2012-01-11 DIAGNOSIS — E1149 Type 2 diabetes mellitus with other diabetic neurological complication: Secondary | ICD-10-CM | POA: Diagnosis not present

## 2012-01-11 DIAGNOSIS — E039 Hypothyroidism, unspecified: Secondary | ICD-10-CM | POA: Diagnosis not present

## 2012-01-14 DIAGNOSIS — E236 Other disorders of pituitary gland: Secondary | ICD-10-CM | POA: Diagnosis not present

## 2012-01-28 DIAGNOSIS — L608 Other nail disorders: Secondary | ICD-10-CM | POA: Diagnosis not present

## 2012-01-28 DIAGNOSIS — E236 Other disorders of pituitary gland: Secondary | ICD-10-CM | POA: Diagnosis not present

## 2012-01-28 DIAGNOSIS — E1149 Type 2 diabetes mellitus with other diabetic neurological complication: Secondary | ICD-10-CM | POA: Diagnosis not present

## 2012-02-04 DIAGNOSIS — Z7901 Long term (current) use of anticoagulants: Secondary | ICD-10-CM | POA: Diagnosis not present

## 2012-02-08 DIAGNOSIS — Z7901 Long term (current) use of anticoagulants: Secondary | ICD-10-CM | POA: Diagnosis not present

## 2012-02-09 DIAGNOSIS — H269 Unspecified cataract: Secondary | ICD-10-CM | POA: Diagnosis not present

## 2012-02-09 DIAGNOSIS — H2589 Other age-related cataract: Secondary | ICD-10-CM | POA: Diagnosis not present

## 2012-02-11 DIAGNOSIS — E236 Other disorders of pituitary gland: Secondary | ICD-10-CM | POA: Diagnosis not present

## 2012-02-18 DIAGNOSIS — Z7901 Long term (current) use of anticoagulants: Secondary | ICD-10-CM | POA: Diagnosis not present

## 2012-02-22 DIAGNOSIS — J019 Acute sinusitis, unspecified: Secondary | ICD-10-CM | POA: Diagnosis not present

## 2012-02-22 DIAGNOSIS — J329 Chronic sinusitis, unspecified: Secondary | ICD-10-CM | POA: Diagnosis not present

## 2012-02-22 DIAGNOSIS — J3489 Other specified disorders of nose and nasal sinuses: Secondary | ICD-10-CM | POA: Diagnosis not present

## 2012-02-23 DIAGNOSIS — Z7901 Long term (current) use of anticoagulants: Secondary | ICD-10-CM | POA: Diagnosis not present

## 2012-02-25 DIAGNOSIS — E236 Other disorders of pituitary gland: Secondary | ICD-10-CM | POA: Diagnosis not present

## 2012-03-01 DIAGNOSIS — I2699 Other pulmonary embolism without acute cor pulmonale: Secondary | ICD-10-CM | POA: Diagnosis not present

## 2012-03-01 DIAGNOSIS — R0602 Shortness of breath: Secondary | ICD-10-CM | POA: Diagnosis not present

## 2012-03-01 DIAGNOSIS — G2581 Restless legs syndrome: Secondary | ICD-10-CM | POA: Diagnosis not present

## 2012-03-02 DIAGNOSIS — Z7901 Long term (current) use of anticoagulants: Secondary | ICD-10-CM | POA: Diagnosis not present

## 2012-03-08 DIAGNOSIS — H2589 Other age-related cataract: Secondary | ICD-10-CM | POA: Diagnosis not present

## 2012-03-08 DIAGNOSIS — H269 Unspecified cataract: Secondary | ICD-10-CM | POA: Diagnosis not present

## 2012-03-09 DIAGNOSIS — E236 Other disorders of pituitary gland: Secondary | ICD-10-CM | POA: Diagnosis not present

## 2012-03-24 DIAGNOSIS — E236 Other disorders of pituitary gland: Secondary | ICD-10-CM | POA: Diagnosis not present

## 2012-04-07 DIAGNOSIS — E236 Other disorders of pituitary gland: Secondary | ICD-10-CM | POA: Diagnosis not present

## 2012-04-11 DIAGNOSIS — E1142 Type 2 diabetes mellitus with diabetic polyneuropathy: Secondary | ICD-10-CM | POA: Diagnosis not present

## 2012-04-11 DIAGNOSIS — E039 Hypothyroidism, unspecified: Secondary | ICD-10-CM | POA: Diagnosis not present

## 2012-04-11 DIAGNOSIS — E1149 Type 2 diabetes mellitus with other diabetic neurological complication: Secondary | ICD-10-CM | POA: Diagnosis not present

## 2012-04-11 DIAGNOSIS — E669 Obesity, unspecified: Secondary | ICD-10-CM | POA: Diagnosis not present

## 2012-04-11 DIAGNOSIS — E236 Other disorders of pituitary gland: Secondary | ICD-10-CM | POA: Diagnosis not present

## 2012-04-11 DIAGNOSIS — R5381 Other malaise: Secondary | ICD-10-CM | POA: Diagnosis not present

## 2012-04-21 DIAGNOSIS — E538 Deficiency of other specified B group vitamins: Secondary | ICD-10-CM | POA: Diagnosis not present

## 2012-04-21 DIAGNOSIS — E236 Other disorders of pituitary gland: Secondary | ICD-10-CM | POA: Diagnosis not present

## 2012-04-28 DIAGNOSIS — E1149 Type 2 diabetes mellitus with other diabetic neurological complication: Secondary | ICD-10-CM | POA: Diagnosis not present

## 2012-04-28 DIAGNOSIS — M216X9 Other acquired deformities of unspecified foot: Secondary | ICD-10-CM | POA: Diagnosis not present

## 2012-04-28 DIAGNOSIS — L608 Other nail disorders: Secondary | ICD-10-CM | POA: Diagnosis not present

## 2012-05-05 DIAGNOSIS — E236 Other disorders of pituitary gland: Secondary | ICD-10-CM | POA: Diagnosis not present

## 2012-05-05 DIAGNOSIS — E538 Deficiency of other specified B group vitamins: Secondary | ICD-10-CM | POA: Diagnosis not present

## 2012-05-16 DIAGNOSIS — I2510 Atherosclerotic heart disease of native coronary artery without angina pectoris: Secondary | ICD-10-CM | POA: Diagnosis not present

## 2012-05-16 DIAGNOSIS — I4891 Unspecified atrial fibrillation: Secondary | ICD-10-CM | POA: Diagnosis not present

## 2012-05-16 DIAGNOSIS — E785 Hyperlipidemia, unspecified: Secondary | ICD-10-CM | POA: Diagnosis not present

## 2012-05-19 DIAGNOSIS — E291 Testicular hypofunction: Secondary | ICD-10-CM | POA: Diagnosis not present

## 2012-05-24 DIAGNOSIS — I4891 Unspecified atrial fibrillation: Secondary | ICD-10-CM | POA: Diagnosis not present

## 2012-05-24 DIAGNOSIS — Z7901 Long term (current) use of anticoagulants: Secondary | ICD-10-CM | POA: Diagnosis not present

## 2012-05-26 DIAGNOSIS — H02839 Dermatochalasis of unspecified eye, unspecified eyelid: Secondary | ICD-10-CM | POA: Diagnosis not present

## 2012-05-30 DIAGNOSIS — I4891 Unspecified atrial fibrillation: Secondary | ICD-10-CM | POA: Diagnosis not present

## 2012-05-30 DIAGNOSIS — Z7901 Long term (current) use of anticoagulants: Secondary | ICD-10-CM | POA: Diagnosis not present

## 2012-05-30 DIAGNOSIS — E782 Mixed hyperlipidemia: Secondary | ICD-10-CM | POA: Diagnosis not present

## 2012-05-30 DIAGNOSIS — M109 Gout, unspecified: Secondary | ICD-10-CM | POA: Diagnosis not present

## 2012-05-30 DIAGNOSIS — E039 Hypothyroidism, unspecified: Secondary | ICD-10-CM | POA: Diagnosis not present

## 2012-05-30 DIAGNOSIS — Z0000 Encounter for general adult medical examination without abnormal findings: Secondary | ICD-10-CM | POA: Diagnosis not present

## 2012-05-30 DIAGNOSIS — E119 Type 2 diabetes mellitus without complications: Secondary | ICD-10-CM | POA: Diagnosis not present

## 2012-05-30 DIAGNOSIS — Z125 Encounter for screening for malignant neoplasm of prostate: Secondary | ICD-10-CM | POA: Diagnosis not present

## 2012-05-30 DIAGNOSIS — I10 Essential (primary) hypertension: Secondary | ICD-10-CM | POA: Diagnosis not present

## 2012-06-02 DIAGNOSIS — E538 Deficiency of other specified B group vitamins: Secondary | ICD-10-CM | POA: Diagnosis not present

## 2012-06-02 DIAGNOSIS — E236 Other disorders of pituitary gland: Secondary | ICD-10-CM | POA: Diagnosis not present

## 2012-06-16 DIAGNOSIS — E538 Deficiency of other specified B group vitamins: Secondary | ICD-10-CM | POA: Diagnosis not present

## 2012-06-16 DIAGNOSIS — E236 Other disorders of pituitary gland: Secondary | ICD-10-CM | POA: Diagnosis not present

## 2012-06-23 DIAGNOSIS — H02409 Unspecified ptosis of unspecified eyelid: Secondary | ICD-10-CM | POA: Diagnosis not present

## 2012-06-30 DIAGNOSIS — E538 Deficiency of other specified B group vitamins: Secondary | ICD-10-CM | POA: Diagnosis not present

## 2012-06-30 DIAGNOSIS — E291 Testicular hypofunction: Secondary | ICD-10-CM | POA: Diagnosis not present

## 2012-07-14 DIAGNOSIS — E236 Other disorders of pituitary gland: Secondary | ICD-10-CM | POA: Diagnosis not present

## 2012-07-14 DIAGNOSIS — E538 Deficiency of other specified B group vitamins: Secondary | ICD-10-CM | POA: Diagnosis not present

## 2012-07-18 DIAGNOSIS — E236 Other disorders of pituitary gland: Secondary | ICD-10-CM | POA: Diagnosis not present

## 2012-07-18 DIAGNOSIS — E1142 Type 2 diabetes mellitus with diabetic polyneuropathy: Secondary | ICD-10-CM | POA: Diagnosis not present

## 2012-07-18 DIAGNOSIS — E669 Obesity, unspecified: Secondary | ICD-10-CM | POA: Diagnosis not present

## 2012-07-18 DIAGNOSIS — R5381 Other malaise: Secondary | ICD-10-CM | POA: Diagnosis not present

## 2012-07-18 DIAGNOSIS — E039 Hypothyroidism, unspecified: Secondary | ICD-10-CM | POA: Diagnosis not present

## 2012-07-18 DIAGNOSIS — E538 Deficiency of other specified B group vitamins: Secondary | ICD-10-CM | POA: Diagnosis not present

## 2012-07-18 DIAGNOSIS — E1149 Type 2 diabetes mellitus with other diabetic neurological complication: Secondary | ICD-10-CM | POA: Diagnosis not present

## 2012-07-18 DIAGNOSIS — E559 Vitamin D deficiency, unspecified: Secondary | ICD-10-CM | POA: Diagnosis not present

## 2012-07-28 DIAGNOSIS — E1149 Type 2 diabetes mellitus with other diabetic neurological complication: Secondary | ICD-10-CM | POA: Diagnosis not present

## 2012-07-28 DIAGNOSIS — E538 Deficiency of other specified B group vitamins: Secondary | ICD-10-CM | POA: Diagnosis not present

## 2012-07-28 DIAGNOSIS — E236 Other disorders of pituitary gland: Secondary | ICD-10-CM | POA: Diagnosis not present

## 2012-07-28 DIAGNOSIS — L608 Other nail disorders: Secondary | ICD-10-CM | POA: Diagnosis not present

## 2012-08-11 DIAGNOSIS — E236 Other disorders of pituitary gland: Secondary | ICD-10-CM | POA: Diagnosis not present

## 2012-08-11 DIAGNOSIS — E538 Deficiency of other specified B group vitamins: Secondary | ICD-10-CM | POA: Diagnosis not present

## 2012-08-17 DIAGNOSIS — E559 Vitamin D deficiency, unspecified: Secondary | ICD-10-CM | POA: Diagnosis not present

## 2012-08-17 DIAGNOSIS — E538 Deficiency of other specified B group vitamins: Secondary | ICD-10-CM | POA: Diagnosis not present

## 2012-08-17 DIAGNOSIS — E1149 Type 2 diabetes mellitus with other diabetic neurological complication: Secondary | ICD-10-CM | POA: Diagnosis not present

## 2012-08-25 DIAGNOSIS — E236 Other disorders of pituitary gland: Secondary | ICD-10-CM | POA: Diagnosis not present

## 2012-08-25 DIAGNOSIS — E538 Deficiency of other specified B group vitamins: Secondary | ICD-10-CM | POA: Diagnosis not present

## 2012-09-08 DIAGNOSIS — E538 Deficiency of other specified B group vitamins: Secondary | ICD-10-CM | POA: Diagnosis not present

## 2012-09-08 DIAGNOSIS — E236 Other disorders of pituitary gland: Secondary | ICD-10-CM | POA: Diagnosis not present

## 2012-09-19 DIAGNOSIS — E236 Other disorders of pituitary gland: Secondary | ICD-10-CM | POA: Diagnosis not present

## 2012-09-19 DIAGNOSIS — E1149 Type 2 diabetes mellitus with other diabetic neurological complication: Secondary | ICD-10-CM | POA: Diagnosis not present

## 2012-09-19 DIAGNOSIS — E039 Hypothyroidism, unspecified: Secondary | ICD-10-CM | POA: Diagnosis not present

## 2012-09-19 DIAGNOSIS — E669 Obesity, unspecified: Secondary | ICD-10-CM | POA: Diagnosis not present

## 2012-09-19 DIAGNOSIS — E559 Vitamin D deficiency, unspecified: Secondary | ICD-10-CM | POA: Diagnosis not present

## 2012-09-19 DIAGNOSIS — E538 Deficiency of other specified B group vitamins: Secondary | ICD-10-CM | POA: Diagnosis not present

## 2012-09-19 DIAGNOSIS — E1142 Type 2 diabetes mellitus with diabetic polyneuropathy: Secondary | ICD-10-CM | POA: Diagnosis not present

## 2012-09-22 DIAGNOSIS — E236 Other disorders of pituitary gland: Secondary | ICD-10-CM | POA: Diagnosis not present

## 2012-09-22 DIAGNOSIS — E538 Deficiency of other specified B group vitamins: Secondary | ICD-10-CM | POA: Diagnosis not present

## 2012-10-05 DIAGNOSIS — E538 Deficiency of other specified B group vitamins: Secondary | ICD-10-CM | POA: Diagnosis not present

## 2012-10-05 DIAGNOSIS — E236 Other disorders of pituitary gland: Secondary | ICD-10-CM | POA: Diagnosis not present

## 2012-10-11 DIAGNOSIS — Z7901 Long term (current) use of anticoagulants: Secondary | ICD-10-CM | POA: Diagnosis not present

## 2012-10-19 DIAGNOSIS — E538 Deficiency of other specified B group vitamins: Secondary | ICD-10-CM | POA: Diagnosis not present

## 2012-10-19 DIAGNOSIS — E236 Other disorders of pituitary gland: Secondary | ICD-10-CM | POA: Diagnosis not present

## 2012-10-25 DIAGNOSIS — J209 Acute bronchitis, unspecified: Secondary | ICD-10-CM | POA: Diagnosis not present

## 2012-10-26 DIAGNOSIS — B351 Tinea unguium: Secondary | ICD-10-CM | POA: Diagnosis not present

## 2012-10-26 DIAGNOSIS — B353 Tinea pedis: Secondary | ICD-10-CM | POA: Diagnosis not present

## 2012-10-27 ENCOUNTER — Ambulatory Visit (INDEPENDENT_AMBULATORY_CARE_PROVIDER_SITE_OTHER): Payer: Medicare Other

## 2012-10-27 VITALS — BP 135/94 | HR 86 | Resp 16 | Ht 71.0 in | Wt 247.0 lb

## 2012-10-27 DIAGNOSIS — E1142 Type 2 diabetes mellitus with diabetic polyneuropathy: Secondary | ICD-10-CM

## 2012-10-27 DIAGNOSIS — E1149 Type 2 diabetes mellitus with other diabetic neurological complication: Secondary | ICD-10-CM | POA: Diagnosis not present

## 2012-10-27 DIAGNOSIS — E1140 Type 2 diabetes mellitus with diabetic neuropathy, unspecified: Secondary | ICD-10-CM

## 2012-10-27 DIAGNOSIS — L608 Other nail disorders: Secondary | ICD-10-CM

## 2012-10-27 NOTE — Patient Instructions (Addendum)

## 2012-10-27 NOTE — Progress Notes (Signed)
Subjective:     Patient ID: Alan Beltran, male   DOB: 02-05-1934, 78 y.o.   MRN: [id number]  HPI patient presents for followup for diabetic foot nail care. No major changes in health or medication history, except for the medication. Patient is any new erythematous plaque dorsolateral aspect left forefoot consistent with a tinea, has been prescribed topicals steroid and antifungal solution by his dermatologist and also placed on oral Lamisil   Review of Systems  HENT: Negative.   Eyes: Negative.   Respiratory: Positive for cough and wheezing.   Endocrine: Negative.   Genitourinary: Negative.   Allergic/Immunologic: Negative.   Neurological: Negative.   Hematological: Negative.   Psychiatric/Behavioral: Negative.        Objective:   Physical Exam  Constitutional: Vital signs are normal. He appears well-developed and well-nourished.  Cardiovascular:  Pulses:      Dorsalis pedis pulses are 2+ on the right side, and 2+ on the left side.       Posterior tibial pulses are 1+ on the right side, and 1+ on the left side.  Capillary refill 3-4 seconds all digits, no edema, rubor, varicosities noted. Temperature warm  Musculoskeletal:       Right foot: Normal.       Left foot: Normal.  Neurological: He is alert. He has normal reflexes.  Skin: Skin is warm and dry.  Psychiatric: He has a normal mood and affect. His behavior is normal. Judgment normal.       Assessment:     Diabetes with peripheral neuropathy, dystrophic/hypertrophic nails with friability discoloration x10    Plan:     Nails debrided x10 No open wounds or lesions are identified, except for the tinea over the dorsal left foot which is being managed by dermatology. Followup in 3 months for continued palliative care  Alvan Dame DPM

## 2012-11-02 DIAGNOSIS — E538 Deficiency of other specified B group vitamins: Secondary | ICD-10-CM | POA: Diagnosis not present

## 2012-11-02 DIAGNOSIS — E236 Other disorders of pituitary gland: Secondary | ICD-10-CM | POA: Diagnosis not present

## 2012-11-16 DIAGNOSIS — E236 Other disorders of pituitary gland: Secondary | ICD-10-CM | POA: Diagnosis not present

## 2012-11-16 DIAGNOSIS — E538 Deficiency of other specified B group vitamins: Secondary | ICD-10-CM | POA: Diagnosis not present

## 2012-11-22 DIAGNOSIS — E1149 Type 2 diabetes mellitus with other diabetic neurological complication: Secondary | ICD-10-CM | POA: Diagnosis not present

## 2012-11-22 DIAGNOSIS — E1142 Type 2 diabetes mellitus with diabetic polyneuropathy: Secondary | ICD-10-CM | POA: Diagnosis not present

## 2012-11-22 DIAGNOSIS — Z23 Encounter for immunization: Secondary | ICD-10-CM | POA: Diagnosis not present

## 2012-11-22 DIAGNOSIS — E236 Other disorders of pituitary gland: Secondary | ICD-10-CM | POA: Diagnosis not present

## 2012-11-22 DIAGNOSIS — E559 Vitamin D deficiency, unspecified: Secondary | ICD-10-CM | POA: Diagnosis not present

## 2012-11-22 DIAGNOSIS — E538 Deficiency of other specified B group vitamins: Secondary | ICD-10-CM | POA: Diagnosis not present

## 2012-11-22 DIAGNOSIS — E669 Obesity, unspecified: Secondary | ICD-10-CM | POA: Diagnosis not present

## 2012-11-22 DIAGNOSIS — E039 Hypothyroidism, unspecified: Secondary | ICD-10-CM | POA: Diagnosis not present

## 2012-11-25 DIAGNOSIS — J019 Acute sinusitis, unspecified: Secondary | ICD-10-CM | POA: Diagnosis not present

## 2012-11-25 DIAGNOSIS — J329 Chronic sinusitis, unspecified: Secondary | ICD-10-CM | POA: Diagnosis not present

## 2012-11-25 DIAGNOSIS — J3489 Other specified disorders of nose and nasal sinuses: Secondary | ICD-10-CM | POA: Diagnosis not present

## 2012-11-30 DIAGNOSIS — E538 Deficiency of other specified B group vitamins: Secondary | ICD-10-CM | POA: Diagnosis not present

## 2012-11-30 DIAGNOSIS — E291 Testicular hypofunction: Secondary | ICD-10-CM | POA: Diagnosis not present

## 2012-12-08 DIAGNOSIS — E785 Hyperlipidemia, unspecified: Secondary | ICD-10-CM | POA: Diagnosis not present

## 2012-12-08 DIAGNOSIS — I2510 Atherosclerotic heart disease of native coronary artery without angina pectoris: Secondary | ICD-10-CM | POA: Diagnosis not present

## 2012-12-08 DIAGNOSIS — I4891 Unspecified atrial fibrillation: Secondary | ICD-10-CM | POA: Diagnosis not present

## 2012-12-14 DIAGNOSIS — E538 Deficiency of other specified B group vitamins: Secondary | ICD-10-CM | POA: Diagnosis not present

## 2012-12-14 DIAGNOSIS — E236 Other disorders of pituitary gland: Secondary | ICD-10-CM | POA: Diagnosis not present

## 2012-12-19 DIAGNOSIS — J019 Acute sinusitis, unspecified: Secondary | ICD-10-CM | POA: Diagnosis not present

## 2012-12-19 DIAGNOSIS — J310 Chronic rhinitis: Secondary | ICD-10-CM | POA: Diagnosis not present

## 2012-12-19 DIAGNOSIS — J3489 Other specified disorders of nose and nasal sinuses: Secondary | ICD-10-CM | POA: Diagnosis not present

## 2012-12-28 DIAGNOSIS — E538 Deficiency of other specified B group vitamins: Secondary | ICD-10-CM | POA: Diagnosis not present

## 2012-12-28 DIAGNOSIS — E236 Other disorders of pituitary gland: Secondary | ICD-10-CM | POA: Diagnosis not present

## 2012-12-29 DIAGNOSIS — R0602 Shortness of breath: Secondary | ICD-10-CM | POA: Diagnosis not present

## 2012-12-29 DIAGNOSIS — G2581 Restless legs syndrome: Secondary | ICD-10-CM | POA: Diagnosis not present

## 2012-12-29 DIAGNOSIS — G4730 Sleep apnea, unspecified: Secondary | ICD-10-CM | POA: Diagnosis not present

## 2013-01-06 DIAGNOSIS — G4730 Sleep apnea, unspecified: Secondary | ICD-10-CM | POA: Diagnosis not present

## 2013-01-10 DIAGNOSIS — G4730 Sleep apnea, unspecified: Secondary | ICD-10-CM | POA: Diagnosis not present

## 2013-01-10 DIAGNOSIS — G4710 Hypersomnia, unspecified: Secondary | ICD-10-CM | POA: Diagnosis not present

## 2013-01-12 DIAGNOSIS — E538 Deficiency of other specified B group vitamins: Secondary | ICD-10-CM | POA: Diagnosis not present

## 2013-01-12 DIAGNOSIS — E236 Other disorders of pituitary gland: Secondary | ICD-10-CM | POA: Diagnosis not present

## 2013-01-17 DIAGNOSIS — H93299 Other abnormal auditory perceptions, unspecified ear: Secondary | ICD-10-CM | POA: Diagnosis not present

## 2013-01-17 DIAGNOSIS — H6120 Impacted cerumen, unspecified ear: Secondary | ICD-10-CM | POA: Diagnosis not present

## 2013-01-24 DIAGNOSIS — R0602 Shortness of breath: Secondary | ICD-10-CM | POA: Diagnosis not present

## 2013-01-24 DIAGNOSIS — G4730 Sleep apnea, unspecified: Secondary | ICD-10-CM | POA: Diagnosis not present

## 2013-01-24 DIAGNOSIS — J45909 Unspecified asthma, uncomplicated: Secondary | ICD-10-CM | POA: Diagnosis not present

## 2013-01-27 DIAGNOSIS — E236 Other disorders of pituitary gland: Secondary | ICD-10-CM | POA: Diagnosis not present

## 2013-01-27 DIAGNOSIS — E538 Deficiency of other specified B group vitamins: Secondary | ICD-10-CM | POA: Diagnosis not present

## 2013-01-30 DIAGNOSIS — H906 Mixed conductive and sensorineural hearing loss, bilateral: Secondary | ICD-10-CM | POA: Diagnosis not present

## 2013-01-30 DIAGNOSIS — H7419 Adhesive middle ear disease, unspecified ear: Secondary | ICD-10-CM | POA: Diagnosis not present

## 2013-01-30 DIAGNOSIS — H6980 Other specified disorders of Eustachian tube, unspecified ear: Secondary | ICD-10-CM | POA: Diagnosis not present

## 2013-01-30 DIAGNOSIS — H65199 Other acute nonsuppurative otitis media, unspecified ear: Secondary | ICD-10-CM | POA: Diagnosis not present

## 2013-01-30 DIAGNOSIS — E119 Type 2 diabetes mellitus without complications: Secondary | ICD-10-CM | POA: Diagnosis not present

## 2013-01-31 DIAGNOSIS — B3749 Other urogenital candidiasis: Secondary | ICD-10-CM | POA: Diagnosis not present

## 2013-01-31 DIAGNOSIS — Z7901 Long term (current) use of anticoagulants: Secondary | ICD-10-CM | POA: Diagnosis not present

## 2013-02-01 DIAGNOSIS — B351 Tinea unguium: Secondary | ICD-10-CM | POA: Diagnosis not present

## 2013-02-01 DIAGNOSIS — L570 Actinic keratosis: Secondary | ICD-10-CM | POA: Diagnosis not present

## 2013-02-01 DIAGNOSIS — B353 Tinea pedis: Secondary | ICD-10-CM | POA: Diagnosis not present

## 2013-02-02 ENCOUNTER — Ambulatory Visit: Payer: Medicare Other

## 2013-02-03 DIAGNOSIS — H903 Sensorineural hearing loss, bilateral: Secondary | ICD-10-CM | POA: Diagnosis not present

## 2013-02-07 DIAGNOSIS — E236 Other disorders of pituitary gland: Secondary | ICD-10-CM | POA: Diagnosis not present

## 2013-02-07 DIAGNOSIS — E538 Deficiency of other specified B group vitamins: Secondary | ICD-10-CM | POA: Diagnosis not present

## 2013-02-16 ENCOUNTER — Ambulatory Visit (INDEPENDENT_AMBULATORY_CARE_PROVIDER_SITE_OTHER): Payer: Medicare Other

## 2013-02-16 VITALS — BP 125/88 | HR 110 | Resp 18

## 2013-02-16 DIAGNOSIS — L608 Other nail disorders: Secondary | ICD-10-CM | POA: Diagnosis not present

## 2013-02-16 DIAGNOSIS — E1140 Type 2 diabetes mellitus with diabetic neuropathy, unspecified: Secondary | ICD-10-CM

## 2013-02-16 DIAGNOSIS — E1149 Type 2 diabetes mellitus with other diabetic neurological complication: Secondary | ICD-10-CM | POA: Diagnosis not present

## 2013-02-16 DIAGNOSIS — E1142 Type 2 diabetes mellitus with diabetic polyneuropathy: Secondary | ICD-10-CM

## 2013-02-16 NOTE — Patient Instructions (Signed)
Diabetes and Foot Care Diabetes may cause you to have problems because of poor blood supply (circulation) to your feet and legs. This may cause the skin on your feet to become thinner, break easier, and heal more slowly. Your skin may become dry, and the skin may peel and crack. You may also have nerve damage in your legs and feet causing decreased feeling in them. You may not notice minor injuries to your feet that could lead to infections or more serious problems. Taking care of your feet is one of the most important things you can do for yourself.  HOME CARE INSTRUCTIONS  Wear shoes at all times, even in the house. Do not go barefoot. Bare feet are easily injured.  Check your feet daily for blisters, cuts, and redness. If you cannot see the bottom of your feet, use a mirror or ask someone for help.  Wash your feet with warm water (do not use hot water) and mild soap. Then pat your feet and the areas between your toes until they are completely dry. Do not soak your feet as this can dry your skin.  Apply a moisturizing lotion or petroleum jelly (that does not contain alcohol and is unscented) to the skin on your feet and to dry, brittle toenails. Do not apply lotion between your toes.  Trim your toenails straight across. Do not dig under them or around the cuticle. File the edges of your nails with an emery board or nail file.  Do not cut corns or calluses or try to remove them with medicine.  Wear clean socks or stockings every day. Make sure they are not too tight. Do not wear knee-high stockings since they may decrease blood flow to your legs.  Wear shoes that fit properly and have enough cushioning. To break in new shoes, wear them for just a few hours a day. This prevents you from injuring your feet. Always look in your shoes before you put them on to be sure there are no objects inside.  Do not cross your legs. This may decrease the blood flow to your feet.  If you find a minor scrape,  cut, or break in the skin on your feet, keep it and the skin around it clean and dry. These areas may be cleansed with mild soap and water. Do not cleanse the area with peroxide, alcohol, or iodine.  When you remove an adhesive bandage, be sure not to damage the skin around it.  If you have a wound, look at it several times a day to make sure it is healing.  Do not use heating pads or hot water bottles. They may burn your skin. If you have lost feeling in your feet or legs, you may not know it is happening until it is too late.  Make sure your health care provider performs a complete foot exam at least annually or more often if you have foot problems. Report any cuts, sores, or bruises to your health care provider immediately. SEEK MEDICAL CARE IF:   You have an injury that is not healing.  You have cuts or breaks in the skin.  You have an ingrown nail.  You notice redness on your legs or feet.  You feel burning or tingling in your legs or feet.  You have pain or cramps in your legs and feet.  Your legs or feet are numb.  Your feet always feel cold. SEEK IMMEDIATE MEDICAL CARE IF:   There is increasing redness,   swelling, or pain in or around a wound.  There is a red line that goes up your leg.  Pus is coming from a wound.  You develop a fever or as directed by your health care provider.  You notice a bad smell coming from an ulcer or wound. Document Released: 01/10/2000 Document Revised: 09/14/2012 Document Reviewed: 06/21/2012 [employer] Patient Information 2014 [employer].  

## 2013-02-16 NOTE — Progress Notes (Signed)
   Subjective:    Patient ID: Alan Beltran, male    DOB: 30-Sep-1934, 79 y.o.   MRN: [id number]  HPI patient presents this time for diabetic foot and nail care no new problems are noted    Review of Systems no changes in new findings noted.     Objective:   Physical Exam Vascular status is intact pedal pulses palpable DP +2/4 bilateral PT plus one over 4 bilateral Refill timed 3-4 seconds skin temperature warm turgor normal no edema rubor pallor or varicosities noted neurologically epicritic and proprioceptive sensations intact although diminished on Semmes Weinstein testing to the digits and plantar forefoot. Dermatological nails thick brittle crumbly friable criptotic 1 through 5 bilateral. There is no open wounds ulcerations no other changes in medical status noted at this time no secondary infections.       Assessment & Plan:  Assessment diabetes with peripheral neuropathy debridement of dystrophic friable criptotic nails 1 through 5 bilateral return for future palliative care in 3 months or as needed contact us in changes or exacerbations in the interim. On debridement lumicain and Neosporin applied to the hallux bilateral.  Harriet Masson DPM

## 2013-02-16 NOTE — Progress Notes (Signed)
° °  Subjective:    Patient ID: Alan Beltran, male    DOB: [birth year], 79 y.o.   MRN: [id number]  HPI I am here to get my nails cut    Review of Systems     Objective:   Physical Exam        Assessment & Plan:

## 2013-02-21 DIAGNOSIS — E236 Other disorders of pituitary gland: Secondary | ICD-10-CM | POA: Diagnosis not present

## 2013-02-21 DIAGNOSIS — H7419 Adhesive middle ear disease, unspecified ear: Secondary | ICD-10-CM | POA: Diagnosis not present

## 2013-02-21 DIAGNOSIS — H906 Mixed conductive and sensorineural hearing loss, bilateral: Secondary | ICD-10-CM | POA: Diagnosis not present

## 2013-02-21 DIAGNOSIS — H6980 Other specified disorders of Eustachian tube, unspecified ear: Secondary | ICD-10-CM | POA: Diagnosis not present

## 2013-02-21 DIAGNOSIS — H65199 Other acute nonsuppurative otitis media, unspecified ear: Secondary | ICD-10-CM | POA: Diagnosis not present

## 2013-02-21 DIAGNOSIS — E538 Deficiency of other specified B group vitamins: Secondary | ICD-10-CM | POA: Diagnosis not present

## 2013-02-21 DIAGNOSIS — E119 Type 2 diabetes mellitus without complications: Secondary | ICD-10-CM | POA: Diagnosis not present

## 2013-02-27 DIAGNOSIS — E669 Obesity, unspecified: Secondary | ICD-10-CM | POA: Diagnosis not present

## 2013-02-27 DIAGNOSIS — E236 Other disorders of pituitary gland: Secondary | ICD-10-CM | POA: Diagnosis not present

## 2013-02-27 DIAGNOSIS — E538 Deficiency of other specified B group vitamins: Secondary | ICD-10-CM | POA: Diagnosis not present

## 2013-02-27 DIAGNOSIS — E039 Hypothyroidism, unspecified: Secondary | ICD-10-CM | POA: Diagnosis not present

## 2013-02-27 DIAGNOSIS — E559 Vitamin D deficiency, unspecified: Secondary | ICD-10-CM | POA: Diagnosis not present

## 2013-02-27 DIAGNOSIS — E1142 Type 2 diabetes mellitus with diabetic polyneuropathy: Secondary | ICD-10-CM | POA: Diagnosis not present

## 2013-02-27 DIAGNOSIS — E1149 Type 2 diabetes mellitus with other diabetic neurological complication: Secondary | ICD-10-CM | POA: Diagnosis not present

## 2013-03-07 DIAGNOSIS — E236 Other disorders of pituitary gland: Secondary | ICD-10-CM | POA: Diagnosis not present

## 2013-03-07 DIAGNOSIS — E538 Deficiency of other specified B group vitamins: Secondary | ICD-10-CM | POA: Diagnosis not present

## 2013-03-15 DIAGNOSIS — I4892 Unspecified atrial flutter: Secondary | ICD-10-CM | POA: Diagnosis not present

## 2013-03-15 DIAGNOSIS — I4891 Unspecified atrial fibrillation: Secondary | ICD-10-CM | POA: Diagnosis not present

## 2013-03-20 DIAGNOSIS — H35379 Puckering of macula, unspecified eye: Secondary | ICD-10-CM | POA: Diagnosis not present

## 2013-03-20 DIAGNOSIS — E119 Type 2 diabetes mellitus without complications: Secondary | ICD-10-CM | POA: Diagnosis not present

## 2013-03-22 DIAGNOSIS — E236 Other disorders of pituitary gland: Secondary | ICD-10-CM | POA: Diagnosis not present

## 2013-03-22 DIAGNOSIS — E538 Deficiency of other specified B group vitamins: Secondary | ICD-10-CM | POA: Diagnosis not present

## 2013-03-29 DIAGNOSIS — E119 Type 2 diabetes mellitus without complications: Secondary | ICD-10-CM | POA: Diagnosis not present

## 2013-03-29 DIAGNOSIS — H6980 Other specified disorders of Eustachian tube, unspecified ear: Secondary | ICD-10-CM | POA: Diagnosis not present

## 2013-03-29 DIAGNOSIS — J069 Acute upper respiratory infection, unspecified: Secondary | ICD-10-CM | POA: Diagnosis not present

## 2013-03-29 DIAGNOSIS — R059 Cough, unspecified: Secondary | ICD-10-CM | POA: Diagnosis not present

## 2013-03-29 DIAGNOSIS — H906 Mixed conductive and sensorineural hearing loss, bilateral: Secondary | ICD-10-CM | POA: Diagnosis not present

## 2013-03-29 DIAGNOSIS — R05 Cough: Secondary | ICD-10-CM | POA: Diagnosis not present

## 2013-03-29 DIAGNOSIS — H7419 Adhesive middle ear disease, unspecified ear: Secondary | ICD-10-CM | POA: Diagnosis not present

## 2013-04-04 DIAGNOSIS — G2581 Restless legs syndrome: Secondary | ICD-10-CM | POA: Diagnosis not present

## 2013-04-04 DIAGNOSIS — J45909 Unspecified asthma, uncomplicated: Secondary | ICD-10-CM | POA: Diagnosis not present

## 2013-04-04 DIAGNOSIS — G4730 Sleep apnea, unspecified: Secondary | ICD-10-CM | POA: Diagnosis not present

## 2013-04-05 DIAGNOSIS — E236 Other disorders of pituitary gland: Secondary | ICD-10-CM | POA: Diagnosis not present

## 2013-04-05 DIAGNOSIS — E538 Deficiency of other specified B group vitamins: Secondary | ICD-10-CM | POA: Diagnosis not present

## 2013-04-10 DIAGNOSIS — H7419 Adhesive middle ear disease, unspecified ear: Secondary | ICD-10-CM | POA: Diagnosis not present

## 2013-04-10 DIAGNOSIS — J069 Acute upper respiratory infection, unspecified: Secondary | ICD-10-CM | POA: Diagnosis not present

## 2013-04-10 DIAGNOSIS — R059 Cough, unspecified: Secondary | ICD-10-CM | POA: Diagnosis not present

## 2013-04-10 DIAGNOSIS — H906 Mixed conductive and sensorineural hearing loss, bilateral: Secondary | ICD-10-CM | POA: Diagnosis not present

## 2013-04-10 DIAGNOSIS — H6980 Other specified disorders of Eustachian tube, unspecified ear: Secondary | ICD-10-CM | POA: Diagnosis not present

## 2013-04-10 DIAGNOSIS — E119 Type 2 diabetes mellitus without complications: Secondary | ICD-10-CM | POA: Diagnosis not present

## 2013-04-10 DIAGNOSIS — R05 Cough: Secondary | ICD-10-CM | POA: Diagnosis not present

## 2013-04-18 DIAGNOSIS — E236 Other disorders of pituitary gland: Secondary | ICD-10-CM | POA: Diagnosis not present

## 2013-04-18 DIAGNOSIS — E538 Deficiency of other specified B group vitamins: Secondary | ICD-10-CM | POA: Diagnosis not present

## 2013-05-02 DIAGNOSIS — E538 Deficiency of other specified B group vitamins: Secondary | ICD-10-CM | POA: Diagnosis not present

## 2013-05-02 DIAGNOSIS — E236 Other disorders of pituitary gland: Secondary | ICD-10-CM | POA: Diagnosis not present

## 2013-05-16 DIAGNOSIS — E538 Deficiency of other specified B group vitamins: Secondary | ICD-10-CM | POA: Diagnosis not present

## 2013-05-16 DIAGNOSIS — E236 Other disorders of pituitary gland: Secondary | ICD-10-CM | POA: Diagnosis not present

## 2013-05-16 DIAGNOSIS — H906 Mixed conductive and sensorineural hearing loss, bilateral: Secondary | ICD-10-CM | POA: Diagnosis not present

## 2013-05-16 DIAGNOSIS — E119 Type 2 diabetes mellitus without complications: Secondary | ICD-10-CM | POA: Diagnosis not present

## 2013-05-16 DIAGNOSIS — H7419 Adhesive middle ear disease, unspecified ear: Secondary | ICD-10-CM | POA: Diagnosis not present

## 2013-05-16 DIAGNOSIS — H6980 Other specified disorders of Eustachian tube, unspecified ear: Secondary | ICD-10-CM | POA: Diagnosis not present

## 2013-05-18 ENCOUNTER — Ambulatory Visit (INDEPENDENT_AMBULATORY_CARE_PROVIDER_SITE_OTHER): Payer: Medicare Other

## 2013-05-18 VITALS — BP 153/102 | HR 116 | Resp 18

## 2013-05-18 DIAGNOSIS — E1149 Type 2 diabetes mellitus with other diabetic neurological complication: Secondary | ICD-10-CM

## 2013-05-18 DIAGNOSIS — L608 Other nail disorders: Secondary | ICD-10-CM

## 2013-05-18 DIAGNOSIS — E1140 Type 2 diabetes mellitus with diabetic neuropathy, unspecified: Secondary | ICD-10-CM

## 2013-05-18 NOTE — Progress Notes (Signed)
   Subjective:    Patient ID: Alan Beltran, male    DOB: [birth year], 79 y.o.   MRN: [id number]  HPI I am here to get my toenails trimmed up and I need some shoes    Review of Systems no new systemic changes or findings noted    Objective:   Physical Exam March the objective findings as follows pedal pulses palpable DP postal for PT one over 4 bilateral capillary refill time 4 seconds all digits epicritic and proprioceptive sensations decreased on Semmes Weinstein testing to toes plantar forefoot neurologically nails thick brittle crumbly friable 1 through 5 bilateral on debridement first right and fifth left [name] with lumicain and Neosporin and Band-Aid dressings. No open wounds or ulcerations noted patient been walking in the pool exercising some been having some improvement as far as of months muscle strength and function of his legs [facility] advised to continue to do so.       Assessment & Plan:  Assessment diabetes with peripheral neuropathy and thick dystrophic friable gratified thick nails 1 through 5 bilateral debridement at this time return for palliative care in 3 months of getting approval for diabetic accident shoes and plan for measurement and eventual fitting at the patient's earliest convenience. Next  Alan Beltran DPM

## 2013-05-18 NOTE — Patient Instructions (Signed)
Diabetes and Foot Care Diabetes may cause you to have problems because of poor blood supply (circulation) to your feet and legs. This may cause the skin on your feet to become thinner, break easier, and heal more slowly. Your skin may become dry, and the skin may peel and crack. You may also have nerve damage in your legs and feet causing decreased feeling in them. You may not notice minor injuries to your feet that could lead to infections or more serious problems. Taking care of your feet is one of the most important things you can do for yourself.  HOME CARE INSTRUCTIONS  Wear shoes at all times, even in the house. Do not go barefoot. Bare feet are easily injured.  Check your feet daily for blisters, cuts, and redness. If you cannot see the bottom of your feet, use a mirror or ask someone for help.  Wash your feet with warm water (do not use hot water) and mild soap. Then pat your feet and the areas between your toes until they are completely dry. Do not soak your feet as this can dry your skin.  Apply a moisturizing lotion or petroleum jelly (that does not contain alcohol and is unscented) to the skin on your feet and to dry, brittle toenails. Do not apply lotion between your toes.  Trim your toenails straight across. Do not dig under them or around the cuticle. File the edges of your nails with an emery board or nail file.  Do not cut corns or calluses or try to remove them with medicine.  Wear clean socks or stockings every day. Make sure they are not too tight. Do not wear knee-high stockings since they may decrease blood flow to your legs.  Wear shoes that fit properly and have enough cushioning. To break in new shoes, wear them for just a few hours a day. This prevents you from injuring your feet. Always look in your shoes before you put them on to be sure there are no objects inside.  Do not cross your legs. This may decrease the blood flow to your feet.  If you find a minor scrape,  cut, or break in the skin on your feet, keep it and the skin around it clean and dry. These areas may be cleansed with mild soap and water. Do not cleanse the area with peroxide, alcohol, or iodine.  When you remove an adhesive bandage, be sure not to damage the skin around it.  If you have a wound, look at it several times a day to make sure it is healing.  Do not use heating pads or hot water bottles. They may burn your skin. If you have lost feeling in your feet or legs, you may not know it is happening until it is too late.  Make sure your health care provider performs a complete foot exam at least annually or more often if you have foot problems. Report any cuts, sores, or bruises to your health care provider immediately. SEEK MEDICAL CARE IF:   You have an injury that is not healing.  You have cuts or breaks in the skin.  You have an ingrown nail.  You notice redness on your legs or feet.  You feel burning or tingling in your legs or feet.  You have pain or cramps in your legs and feet.  Your legs or feet are numb.  Your feet always feel cold. SEEK IMMEDIATE MEDICAL CARE IF:   There is increasing redness,   swelling, or pain in or around a wound.  There is a red line that goes up your leg.  Pus is coming from a wound.  You develop a fever or as directed by your health care provider.  You notice a bad smell coming from an ulcer or wound. Document Released: 01/10/2000 Document Revised: 09/14/2012 Document Reviewed: 06/21/2012 [employer] Patient Information 2014 [employer].  

## 2013-05-29 DIAGNOSIS — E559 Vitamin D deficiency, unspecified: Secondary | ICD-10-CM | POA: Diagnosis not present

## 2013-05-29 DIAGNOSIS — E039 Hypothyroidism, unspecified: Secondary | ICD-10-CM | POA: Diagnosis not present

## 2013-05-29 DIAGNOSIS — Z6836 Body mass index (BMI) 36.0-36.9, adult: Secondary | ICD-10-CM | POA: Diagnosis not present

## 2013-05-29 DIAGNOSIS — E236 Other disorders of pituitary gland: Secondary | ICD-10-CM | POA: Diagnosis not present

## 2013-05-29 DIAGNOSIS — E538 Deficiency of other specified B group vitamins: Secondary | ICD-10-CM | POA: Diagnosis not present

## 2013-05-29 DIAGNOSIS — E1142 Type 2 diabetes mellitus with diabetic polyneuropathy: Secondary | ICD-10-CM | POA: Diagnosis not present

## 2013-05-29 DIAGNOSIS — E1149 Type 2 diabetes mellitus with other diabetic neurological complication: Secondary | ICD-10-CM | POA: Diagnosis not present

## 2013-05-29 DIAGNOSIS — E669 Obesity, unspecified: Secondary | ICD-10-CM | POA: Diagnosis not present

## 2013-06-01 DIAGNOSIS — L219 Seborrheic dermatitis, unspecified: Secondary | ICD-10-CM | POA: Diagnosis not present

## 2013-06-01 DIAGNOSIS — L570 Actinic keratosis: Secondary | ICD-10-CM | POA: Diagnosis not present

## 2013-06-01 DIAGNOSIS — L821 Other seborrheic keratosis: Secondary | ICD-10-CM | POA: Diagnosis not present

## 2013-06-01 DIAGNOSIS — B351 Tinea unguium: Secondary | ICD-10-CM | POA: Diagnosis not present

## 2013-06-13 DIAGNOSIS — E236 Other disorders of pituitary gland: Secondary | ICD-10-CM | POA: Diagnosis not present

## 2013-06-13 DIAGNOSIS — E538 Deficiency of other specified B group vitamins: Secondary | ICD-10-CM | POA: Diagnosis not present

## 2013-06-22 DIAGNOSIS — L219 Seborrheic dermatitis, unspecified: Secondary | ICD-10-CM | POA: Diagnosis not present

## 2013-06-27 DIAGNOSIS — E039 Hypothyroidism, unspecified: Secondary | ICD-10-CM | POA: Diagnosis not present

## 2013-06-27 DIAGNOSIS — E538 Deficiency of other specified B group vitamins: Secondary | ICD-10-CM | POA: Diagnosis not present

## 2013-07-05 DIAGNOSIS — H6980 Other specified disorders of Eustachian tube, unspecified ear: Secondary | ICD-10-CM | POA: Diagnosis not present

## 2013-07-05 DIAGNOSIS — H906 Mixed conductive and sensorineural hearing loss, bilateral: Secondary | ICD-10-CM | POA: Diagnosis not present

## 2013-07-05 DIAGNOSIS — J069 Acute upper respiratory infection, unspecified: Secondary | ICD-10-CM | POA: Diagnosis not present

## 2013-07-05 DIAGNOSIS — E119 Type 2 diabetes mellitus without complications: Secondary | ICD-10-CM | POA: Diagnosis not present

## 2013-07-05 DIAGNOSIS — H7419 Adhesive middle ear disease, unspecified ear: Secondary | ICD-10-CM | POA: Diagnosis not present

## 2013-07-11 DIAGNOSIS — E538 Deficiency of other specified B group vitamins: Secondary | ICD-10-CM | POA: Diagnosis not present

## 2013-07-11 DIAGNOSIS — E236 Other disorders of pituitary gland: Secondary | ICD-10-CM | POA: Diagnosis not present

## 2013-07-14 DIAGNOSIS — Z7901 Long term (current) use of anticoagulants: Secondary | ICD-10-CM | POA: Diagnosis not present

## 2013-07-19 DIAGNOSIS — R0789 Other chest pain: Secondary | ICD-10-CM | POA: Diagnosis not present

## 2013-07-19 DIAGNOSIS — R079 Chest pain, unspecified: Secondary | ICD-10-CM | POA: Diagnosis not present

## 2013-07-19 DIAGNOSIS — R0989 Other specified symptoms and signs involving the circulatory and respiratory systems: Secondary | ICD-10-CM | POA: Diagnosis not present

## 2013-07-19 DIAGNOSIS — R0609 Other forms of dyspnea: Secondary | ICD-10-CM | POA: Diagnosis not present

## 2013-07-20 DIAGNOSIS — I4891 Unspecified atrial fibrillation: Secondary | ICD-10-CM | POA: Diagnosis not present

## 2013-07-20 DIAGNOSIS — I2510 Atherosclerotic heart disease of native coronary artery without angina pectoris: Secondary | ICD-10-CM | POA: Diagnosis not present

## 2013-07-20 DIAGNOSIS — R079 Chest pain, unspecified: Secondary | ICD-10-CM | POA: Diagnosis not present

## 2013-07-20 DIAGNOSIS — E785 Hyperlipidemia, unspecified: Secondary | ICD-10-CM | POA: Diagnosis not present

## 2013-07-20 DIAGNOSIS — R0789 Other chest pain: Secondary | ICD-10-CM | POA: Diagnosis not present

## 2013-07-21 DIAGNOSIS — E669 Obesity, unspecified: Secondary | ICD-10-CM | POA: Diagnosis present

## 2013-07-21 DIAGNOSIS — R9430 Abnormal result of cardiovascular function study, unspecified: Secondary | ICD-10-CM | POA: Diagnosis not present

## 2013-07-21 DIAGNOSIS — K219 Gastro-esophageal reflux disease without esophagitis: Secondary | ICD-10-CM | POA: Diagnosis present

## 2013-07-21 DIAGNOSIS — J449 Chronic obstructive pulmonary disease, unspecified: Secondary | ICD-10-CM | POA: Diagnosis present

## 2013-07-21 DIAGNOSIS — R079 Chest pain, unspecified: Secondary | ICD-10-CM | POA: Diagnosis not present

## 2013-07-21 DIAGNOSIS — E785 Hyperlipidemia, unspecified: Secondary | ICD-10-CM | POA: Diagnosis present

## 2013-07-21 DIAGNOSIS — R9439 Abnormal result of other cardiovascular function study: Secondary | ICD-10-CM | POA: Diagnosis not present

## 2013-07-21 DIAGNOSIS — I4892 Unspecified atrial flutter: Secondary | ICD-10-CM | POA: Diagnosis not present

## 2013-07-21 DIAGNOSIS — R0789 Other chest pain: Secondary | ICD-10-CM | POA: Diagnosis not present

## 2013-07-21 DIAGNOSIS — N400 Enlarged prostate without lower urinary tract symptoms: Secondary | ICD-10-CM | POA: Diagnosis present

## 2013-07-21 DIAGNOSIS — M1990 Unspecified osteoarthritis, unspecified site: Secondary | ICD-10-CM | POA: Diagnosis present

## 2013-07-21 DIAGNOSIS — E039 Hypothyroidism, unspecified: Secondary | ICD-10-CM | POA: Diagnosis present

## 2013-07-21 DIAGNOSIS — I2510 Atherosclerotic heart disease of native coronary artery without angina pectoris: Secondary | ICD-10-CM | POA: Diagnosis present

## 2013-07-21 DIAGNOSIS — Z6834 Body mass index (BMI) 34.0-34.9, adult: Secondary | ICD-10-CM | POA: Diagnosis not present

## 2013-07-21 DIAGNOSIS — E119 Type 2 diabetes mellitus without complications: Secondary | ICD-10-CM | POA: Diagnosis not present

## 2013-07-21 DIAGNOSIS — Z79899 Other long term (current) drug therapy: Secondary | ICD-10-CM | POA: Diagnosis not present

## 2013-07-21 DIAGNOSIS — Z951 Presence of aortocoronary bypass graft: Secondary | ICD-10-CM | POA: Diagnosis not present

## 2013-07-21 DIAGNOSIS — E230 Hypopituitarism: Secondary | ICD-10-CM | POA: Diagnosis not present

## 2013-07-21 DIAGNOSIS — R0602 Shortness of breath: Secondary | ICD-10-CM | POA: Diagnosis not present

## 2013-07-21 DIAGNOSIS — Z7901 Long term (current) use of anticoagulants: Secondary | ICD-10-CM | POA: Diagnosis not present

## 2013-07-21 DIAGNOSIS — I4891 Unspecified atrial fibrillation: Secondary | ICD-10-CM | POA: Diagnosis not present

## 2013-07-21 DIAGNOSIS — G4733 Obstructive sleep apnea (adult) (pediatric): Secondary | ICD-10-CM | POA: Diagnosis present

## 2013-07-25 DIAGNOSIS — E538 Deficiency of other specified B group vitamins: Secondary | ICD-10-CM | POA: Diagnosis not present

## 2013-07-25 DIAGNOSIS — E236 Other disorders of pituitary gland: Secondary | ICD-10-CM | POA: Diagnosis not present

## 2013-07-27 DIAGNOSIS — I2510 Atherosclerotic heart disease of native coronary artery without angina pectoris: Secondary | ICD-10-CM | POA: Diagnosis not present

## 2013-07-27 DIAGNOSIS — Z79899 Other long term (current) drug therapy: Secondary | ICD-10-CM | POA: Diagnosis not present

## 2013-07-27 DIAGNOSIS — I4892 Unspecified atrial flutter: Secondary | ICD-10-CM | POA: Diagnosis not present

## 2013-07-27 DIAGNOSIS — E785 Hyperlipidemia, unspecified: Secondary | ICD-10-CM | POA: Diagnosis not present

## 2013-07-27 DIAGNOSIS — I4891 Unspecified atrial fibrillation: Secondary | ICD-10-CM | POA: Diagnosis not present

## 2013-07-27 DIAGNOSIS — I10 Essential (primary) hypertension: Secondary | ICD-10-CM | POA: Diagnosis not present

## 2013-07-27 DIAGNOSIS — R9431 Abnormal electrocardiogram [ECG] [EKG]: Secondary | ICD-10-CM | POA: Diagnosis not present

## 2013-07-27 DIAGNOSIS — Z7901 Long term (current) use of anticoagulants: Secondary | ICD-10-CM | POA: Diagnosis not present

## 2013-08-08 DIAGNOSIS — E236 Other disorders of pituitary gland: Secondary | ICD-10-CM | POA: Diagnosis not present

## 2013-08-08 DIAGNOSIS — E538 Deficiency of other specified B group vitamins: Secondary | ICD-10-CM | POA: Diagnosis not present

## 2013-08-11 DIAGNOSIS — I440 Atrioventricular block, first degree: Secondary | ICD-10-CM | POA: Diagnosis not present

## 2013-08-11 DIAGNOSIS — Z538 Procedure and treatment not carried out for other reasons: Secondary | ICD-10-CM | POA: Diagnosis not present

## 2013-08-11 DIAGNOSIS — I4891 Unspecified atrial fibrillation: Secondary | ICD-10-CM | POA: Diagnosis not present

## 2013-08-11 DIAGNOSIS — Z7901 Long term (current) use of anticoagulants: Secondary | ICD-10-CM | POA: Diagnosis not present

## 2013-08-11 DIAGNOSIS — Z79899 Other long term (current) drug therapy: Secondary | ICD-10-CM | POA: Diagnosis not present

## 2013-08-16 DIAGNOSIS — H7419 Adhesive middle ear disease, unspecified ear: Secondary | ICD-10-CM | POA: Diagnosis not present

## 2013-08-16 DIAGNOSIS — H906 Mixed conductive and sensorineural hearing loss, bilateral: Secondary | ICD-10-CM | POA: Diagnosis not present

## 2013-08-16 DIAGNOSIS — H6980 Other specified disorders of Eustachian tube, unspecified ear: Secondary | ICD-10-CM | POA: Diagnosis not present

## 2013-08-16 DIAGNOSIS — E119 Type 2 diabetes mellitus without complications: Secondary | ICD-10-CM | POA: Diagnosis not present

## 2013-08-22 DIAGNOSIS — E236 Other disorders of pituitary gland: Secondary | ICD-10-CM | POA: Diagnosis not present

## 2013-08-22 DIAGNOSIS — E538 Deficiency of other specified B group vitamins: Secondary | ICD-10-CM | POA: Diagnosis not present

## 2013-08-24 ENCOUNTER — Ambulatory Visit (INDEPENDENT_AMBULATORY_CARE_PROVIDER_SITE_OTHER): Payer: Medicare Other

## 2013-08-24 VITALS — BP 135/82 | HR 85 | Resp 18

## 2013-08-24 DIAGNOSIS — L608 Other nail disorders: Secondary | ICD-10-CM | POA: Diagnosis not present

## 2013-08-24 DIAGNOSIS — E1149 Type 2 diabetes mellitus with other diabetic neurological complication: Secondary | ICD-10-CM | POA: Diagnosis not present

## 2013-08-24 DIAGNOSIS — E1140 Type 2 diabetes mellitus with diabetic neuropathy, unspecified: Secondary | ICD-10-CM

## 2013-08-24 NOTE — Progress Notes (Signed)
   Subjective:    Patient ID: Alan Beltran, male    DOB: 03/31/1934, 79 y.o.   MRN: [id number]  HPI I AM HERE TO GET MY TOENAILS TRIMMED UP    Review of Systems no new findings or systemic changes noted    Objective:   Physical Exam Lower extremity objective findings intact and unchanged pedal pulses DP +2/4 PT one over 4 bilateral refill time 4 seconds. Sensation diminished on Semmes Weinstein testing to forefoot digits. Normal plantar response DTRs are listed nails thick brittle crumbly friable bilateral 1 through 5 right 2 through 5 left left hallux nails previously. No open wounds or ulcers are noted no secondary infections no patient has gotten authorization for diabetic shoes however when performs is missing something to get proper form from his primary physician who would be authorized diabetic shoes currently need to get the appropriate paperwork completed       Assessment & Plan:  Assessment diabetes with peripheral neuropathy thick dystrophic probably mycotic nails 1 through 5 right 2 through 5 left are debrided nails 3x9 the presence of diabetes and complications will continue to obtain authorization for completely authorization for diabetic shoes and will be provided for medicine as possible.  Harriet Masson DPM

## 2013-08-24 NOTE — Patient Instructions (Signed)
Diabetes and Foot Care Diabetes may cause you to have problems because of poor blood supply (circulation) to your feet and legs. This may cause the skin on your feet to become thinner, break easier, and heal more slowly. Your skin may become dry, and the skin may peel and crack. You may also have nerve damage in your legs and feet causing decreased feeling in them. You may not notice minor injuries to your feet that could lead to infections or more serious problems. Taking care of your feet is one of the most important things you can do for yourself.  HOME CARE INSTRUCTIONS  Wear shoes at all times, even in the house. Do not go barefoot. Bare feet are easily injured.  Check your feet daily for blisters, cuts, and redness. If you cannot see the bottom of your feet, use a mirror or ask someone for help.  Wash your feet with warm water (do not use hot water) and mild soap. Then pat your feet and the areas between your toes until they are completely dry. Do not soak your feet as this can dry your skin.  Apply a moisturizing lotion or petroleum jelly (that does not contain alcohol and is unscented) to the skin on your feet and to dry, brittle toenails. Do not apply lotion between your toes.  Trim your toenails straight across. Do not dig under them or around the cuticle. File the edges of your nails with an emery board or nail file.  Do not cut corns or calluses or try to remove them with medicine.  Wear clean socks or stockings every day. Make sure they are not too tight. Do not wear knee-high stockings since they may decrease blood flow to your legs.  Wear shoes that fit properly and have enough cushioning. To break in new shoes, wear them for just a few hours a day. This prevents you from injuring your feet. Always look in your shoes before you put them on to be sure there are no objects inside.  Do not cross your legs. This may decrease the blood flow to your feet.  If you find a minor scrape,  cut, or break in the skin on your feet, keep it and the skin around it clean and dry. These areas may be cleansed with mild soap and water. Do not cleanse the area with peroxide, alcohol, or iodine.  When you remove an adhesive bandage, be sure not to damage the skin around it.  If you have a wound, look at it several times a day to make sure it is healing.  Do not use heating pads or hot water bottles. They may burn your skin. If you have lost feeling in your feet or legs, you may not know it is happening until it is too late.  Make sure your health care provider performs a complete foot exam at least annually or more often if you have foot problems. Report any cuts, sores, or bruises to your health care provider immediately. SEEK MEDICAL CARE IF:   You have an injury that is not healing.  You have cuts or breaks in the skin.  You have an ingrown nail.  You notice redness on your legs or feet.  You feel burning or tingling in your legs or feet.  You have pain or cramps in your legs and feet.  Your legs or feet are numb.  Your feet always feel cold. SEEK IMMEDIATE MEDICAL CARE IF:   There is increasing redness,   swelling, or pain in or around a wound.  There is a red line that goes up your leg.  Pus is coming from a wound.  You develop a fever or as directed by your health care provider.  You notice a bad smell coming from an ulcer or wound. Document Released: 01/10/2000 Document Revised: 09/14/2012 Document Reviewed: 06/21/2012 Exit[employer] Patient Information 2015 [employer]. This information is not intended to replace advice given to you by your health care provider. Make sure you discuss any questions you have with your health care provider.  

## 2013-09-05 DIAGNOSIS — E236 Other disorders of pituitary gland: Secondary | ICD-10-CM | POA: Diagnosis not present

## 2013-09-05 DIAGNOSIS — E538 Deficiency of other specified B group vitamins: Secondary | ICD-10-CM | POA: Diagnosis not present

## 2013-09-08 DIAGNOSIS — I4891 Unspecified atrial fibrillation: Secondary | ICD-10-CM | POA: Diagnosis not present

## 2013-09-08 DIAGNOSIS — I428 Other cardiomyopathies: Secondary | ICD-10-CM | POA: Diagnosis not present

## 2013-09-08 DIAGNOSIS — I2510 Atherosclerotic heart disease of native coronary artery without angina pectoris: Secondary | ICD-10-CM | POA: Diagnosis not present

## 2013-09-12 DIAGNOSIS — I4891 Unspecified atrial fibrillation: Secondary | ICD-10-CM | POA: Diagnosis not present

## 2013-09-12 DIAGNOSIS — I509 Heart failure, unspecified: Secondary | ICD-10-CM | POA: Diagnosis not present

## 2013-09-15 DIAGNOSIS — E1149 Type 2 diabetes mellitus with other diabetic neurological complication: Secondary | ICD-10-CM | POA: Diagnosis not present

## 2013-09-15 DIAGNOSIS — E236 Other disorders of pituitary gland: Secondary | ICD-10-CM | POA: Diagnosis not present

## 2013-09-15 DIAGNOSIS — E669 Obesity, unspecified: Secondary | ICD-10-CM | POA: Diagnosis not present

## 2013-09-15 DIAGNOSIS — E039 Hypothyroidism, unspecified: Secondary | ICD-10-CM | POA: Diagnosis not present

## 2013-09-15 DIAGNOSIS — E1142 Type 2 diabetes mellitus with diabetic polyneuropathy: Secondary | ICD-10-CM | POA: Diagnosis not present

## 2013-09-15 DIAGNOSIS — Z6836 Body mass index (BMI) 36.0-36.9, adult: Secondary | ICD-10-CM | POA: Diagnosis not present

## 2013-09-15 DIAGNOSIS — E559 Vitamin D deficiency, unspecified: Secondary | ICD-10-CM | POA: Diagnosis not present

## 2013-09-15 DIAGNOSIS — E538 Deficiency of other specified B group vitamins: Secondary | ICD-10-CM | POA: Diagnosis not present

## 2013-09-26 DIAGNOSIS — E236 Other disorders of pituitary gland: Secondary | ICD-10-CM | POA: Diagnosis not present

## 2013-09-26 DIAGNOSIS — E538 Deficiency of other specified B group vitamins: Secondary | ICD-10-CM | POA: Diagnosis not present

## 2013-10-04 DIAGNOSIS — E119 Type 2 diabetes mellitus without complications: Secondary | ICD-10-CM | POA: Diagnosis not present

## 2013-10-04 DIAGNOSIS — H6980 Other specified disorders of Eustachian tube, unspecified ear: Secondary | ICD-10-CM | POA: Diagnosis not present

## 2013-10-04 DIAGNOSIS — H7419 Adhesive middle ear disease, unspecified ear: Secondary | ICD-10-CM | POA: Diagnosis not present

## 2013-10-04 DIAGNOSIS — H65199 Other acute nonsuppurative otitis media, unspecified ear: Secondary | ICD-10-CM | POA: Diagnosis not present

## 2013-10-04 DIAGNOSIS — H906 Mixed conductive and sensorineural hearing loss, bilateral: Secondary | ICD-10-CM | POA: Diagnosis not present

## 2013-10-06 DIAGNOSIS — E236 Other disorders of pituitary gland: Secondary | ICD-10-CM | POA: Diagnosis not present

## 2013-10-06 DIAGNOSIS — E538 Deficiency of other specified B group vitamins: Secondary | ICD-10-CM | POA: Diagnosis not present

## 2013-10-18 ENCOUNTER — Ambulatory Visit (INDEPENDENT_AMBULATORY_CARE_PROVIDER_SITE_OTHER): Payer: Medicare Other

## 2013-10-18 VITALS — BP 163/96 | HR 87 | Resp 18

## 2013-10-18 DIAGNOSIS — Q828 Other specified congenital malformations of skin: Secondary | ICD-10-CM

## 2013-10-18 DIAGNOSIS — E1140 Type 2 diabetes mellitus with diabetic neuropathy, unspecified: Secondary | ICD-10-CM

## 2013-10-18 DIAGNOSIS — M2040 Other hammer toe(s) (acquired), unspecified foot: Secondary | ICD-10-CM

## 2013-10-18 DIAGNOSIS — E1149 Type 2 diabetes mellitus with other diabetic neurological complication: Secondary | ICD-10-CM

## 2013-10-18 NOTE — Patient Instructions (Signed)
Diabetes and Foot Care Diabetes may cause you to have problems because of poor blood supply (circulation) to your feet and legs. This may cause the skin on your feet to become thinner, break easier, and heal more slowly. Your skin may become dry, and the skin may peel and crack. You may also have nerve damage in your legs and feet causing decreased feeling in them. You may not notice minor injuries to your feet that could lead to infections or more serious problems. Taking care of your feet is one of the most important things you can do for yourself.  HOME CARE INSTRUCTIONS  Wear shoes at all times, even in the house. Do not go barefoot. Bare feet are easily injured.  Check your feet daily for blisters, cuts, and redness. If you cannot see the bottom of your feet, use a mirror or ask someone for help.  Wash your feet with warm water (do not use hot water) and mild soap. Then pat your feet and the areas between your toes until they are completely dry. Do not soak your feet as this can dry your skin.  Apply a moisturizing lotion or petroleum jelly (that does not contain alcohol and is unscented) to the skin on your feet and to dry, brittle toenails. Do not apply lotion between your toes.  Trim your toenails straight across. Do not dig under them or around the cuticle. File the edges of your nails with an emery board or nail file.  Do not cut corns or calluses or try to remove them with medicine.  Wear clean socks or stockings every day. Make sure they are not too tight. Do not wear knee-high stockings since they may decrease blood flow to your legs.  Wear shoes that fit properly and have enough cushioning. To break in new shoes, wear them for just a few hours a day. This prevents you from injuring your feet. Always look in your shoes before you put them on to be sure there are no objects inside.  Do not cross your legs. This may decrease the blood flow to your feet.  If you find a minor scrape,  cut, or break in the skin on your feet, keep it and the skin around it clean and dry. These areas may be cleansed with mild soap and water. Do not cleanse the area with peroxide, alcohol, or iodine.  When you remove an adhesive bandage, be sure not to damage the skin around it.  If you have a wound, look at it several times a day to make sure it is healing.  Do not use heating pads or hot water bottles. They may burn your skin. If you have lost feeling in your feet or legs, you may not know it is happening until it is too late.  Make sure your health care provider performs a complete foot exam at least annually or more often if you have foot problems. Report any cuts, sores, or bruises to your health care provider immediately. SEEK MEDICAL CARE IF:   You have an injury that is not healing.  You have cuts or breaks in the skin.  You have an ingrown nail.  You notice redness on your legs or feet.  You feel burning or tingling in your legs or feet.  You have pain or cramps in your legs and feet.  Your legs or feet are numb.  Your feet always feel cold. SEEK IMMEDIATE MEDICAL CARE IF:   There is increasing redness,   swelling, or pain in or around a wound.  There is a red line that goes up your leg.  Pus is coming from a wound.  You develop a fever or as directed by your health care provider.  You notice a bad smell coming from an ulcer or wound. Document Released: 01/10/2000 Document Revised: 09/14/2012 Document Reviewed: 06/21/2012 Exit[employer] Patient Information 2015 [employer]. This information is not intended to replace advice given to you by your health care provider. Make sure you discuss any questions you have with your health care provider.  

## 2013-10-18 NOTE — Progress Notes (Signed)
   Subjective:    Patient ID: Alan Beltran, male    DOB: 10-12-1934, 79 y.o.   MRN: [id number]  HPI i am here to get my diabetic shoes    Review of Systems no new findings or systemic changes     Objective:   Physical Exam Neurovascular status is intact pedal pulses palpable DP +2 PT one over 4 bilateral capillary refill time 3 seconds digital contractures noted pinch callus the hallux mild digital contractures associated keratoses are noted no open wounds or ulcers no secondary infections patient scheduled for palliative nail care next month at this time presents for pickup of diabetic shoes and fitting of shoes and insoles 1 pair shoes and 3 pairs of multiple dense the diabetic insoles are dispensed a fit and contour well to the foot with full contact to the foot and arch as instructed as indicated. Patient is given instructions for shoewear break in and adjustments over the next month . Has been diabetic shoes before these are his replacing previous shoes which are worn no signs of infection no irritation is noted no new problems noted      Assessment & Plan:  Assessment diabetes with history peripheral neuropathy and mild angiopathy multiple keratoses and digital contractures noted diabetic shoes fit and contour well instructions given recheck in one month for continued palliative care in the future as needed  Harriet Masson DPM

## 2013-10-19 DIAGNOSIS — I428 Other cardiomyopathies: Secondary | ICD-10-CM | POA: Diagnosis not present

## 2013-10-19 DIAGNOSIS — I2510 Atherosclerotic heart disease of native coronary artery without angina pectoris: Secondary | ICD-10-CM | POA: Diagnosis not present

## 2013-10-20 DIAGNOSIS — I2510 Atherosclerotic heart disease of native coronary artery without angina pectoris: Secondary | ICD-10-CM | POA: Diagnosis not present

## 2013-10-20 DIAGNOSIS — I428 Other cardiomyopathies: Secondary | ICD-10-CM | POA: Diagnosis not present

## 2013-10-23 DIAGNOSIS — Z7901 Long term (current) use of anticoagulants: Secondary | ICD-10-CM | POA: Diagnosis not present

## 2013-10-23 DIAGNOSIS — Z23 Encounter for immunization: Secondary | ICD-10-CM | POA: Diagnosis not present

## 2013-10-24 DIAGNOSIS — E538 Deficiency of other specified B group vitamins: Secondary | ICD-10-CM | POA: Diagnosis not present

## 2013-10-24 DIAGNOSIS — E236 Other disorders of pituitary gland: Secondary | ICD-10-CM | POA: Diagnosis not present

## 2013-10-30 DIAGNOSIS — H7412 Adhesive left middle ear disease: Secondary | ICD-10-CM | POA: Diagnosis not present

## 2013-10-30 DIAGNOSIS — H906 Mixed conductive and sensorineural hearing loss, bilateral: Secondary | ICD-10-CM | POA: Diagnosis not present

## 2013-10-30 DIAGNOSIS — E220 Acromegaly and pituitary gigantism: Secondary | ICD-10-CM | POA: Diagnosis not present

## 2013-10-30 DIAGNOSIS — H6983 Other specified disorders of Eustachian tube, bilateral: Secondary | ICD-10-CM | POA: Diagnosis not present

## 2013-10-30 DIAGNOSIS — E119 Type 2 diabetes mellitus without complications: Secondary | ICD-10-CM | POA: Diagnosis not present

## 2013-10-31 DIAGNOSIS — G4733 Obstructive sleep apnea (adult) (pediatric): Secondary | ICD-10-CM | POA: Diagnosis not present

## 2013-10-31 DIAGNOSIS — J45909 Unspecified asthma, uncomplicated: Secondary | ICD-10-CM | POA: Diagnosis not present

## 2013-10-31 DIAGNOSIS — G2581 Restless legs syndrome: Secondary | ICD-10-CM | POA: Diagnosis not present

## 2013-11-07 DIAGNOSIS — E538 Deficiency of other specified B group vitamins: Secondary | ICD-10-CM | POA: Diagnosis not present

## 2013-11-07 DIAGNOSIS — E291 Testicular hypofunction: Secondary | ICD-10-CM | POA: Diagnosis not present

## 2013-11-15 DIAGNOSIS — M6281 Muscle weakness (generalized): Secondary | ICD-10-CM | POA: Diagnosis not present

## 2013-11-20 DIAGNOSIS — E119 Type 2 diabetes mellitus without complications: Secondary | ICD-10-CM | POA: Diagnosis not present

## 2013-11-21 DIAGNOSIS — E236 Other disorders of pituitary gland: Secondary | ICD-10-CM | POA: Diagnosis not present

## 2013-11-21 DIAGNOSIS — E538 Deficiency of other specified B group vitamins: Secondary | ICD-10-CM | POA: Diagnosis not present

## 2013-11-22 DIAGNOSIS — M5136 Other intervertebral disc degeneration, lumbar region: Secondary | ICD-10-CM | POA: Diagnosis not present

## 2013-11-22 DIAGNOSIS — M47816 Spondylosis without myelopathy or radiculopathy, lumbar region: Secondary | ICD-10-CM | POA: Diagnosis not present

## 2013-11-23 ENCOUNTER — Ambulatory Visit (INDEPENDENT_AMBULATORY_CARE_PROVIDER_SITE_OTHER): Payer: Medicare Other

## 2013-11-23 VITALS — BP 140/84 | HR 82 | Resp 12

## 2013-11-23 DIAGNOSIS — Q828 Other specified congenital malformations of skin: Secondary | ICD-10-CM

## 2013-11-23 DIAGNOSIS — M2040 Other hammer toe(s) (acquired), unspecified foot: Secondary | ICD-10-CM

## 2013-11-23 DIAGNOSIS — E1140 Type 2 diabetes mellitus with diabetic neuropathy, unspecified: Secondary | ICD-10-CM | POA: Diagnosis not present

## 2013-11-23 DIAGNOSIS — L603 Nail dystrophy: Secondary | ICD-10-CM

## 2013-11-23 NOTE — Progress Notes (Signed)
   Subjective:    Patient ID: Alan Beltran, male    DOB: [birth year], 79 y.o.   MRN: [id number]  HPI  TOENAILS TRIM.  Review of Systems no new findings or systemic changes noted     Objective:   Physical Exam neurovascular status is intact and unchanged patient is thick brittle criptotic incurvated nails 1 through 5 bilateral overhaul have partial nail excision of left hallux noted with no regrowth at this time. There is history of diabetes with decreased epicritic sensation confirmed and Ernestina Patches to the forefoot and digits he'll pulses DP +2 PT 1 over 4 bilateral reviewed no new changes no findings of secondary infection is noted.      Assessment & Plan:  Assessment this time is diabetes history peripheral neuropathy thick brittle, dystrophic nails 1 through 5 bilateral debrided and she one through 5 right 2 through 5 left are debrided mycotic nails 9 the presence of diabetes and complications return for future palliative care is needed next  Harriet Masson DPM

## 2013-11-24 DIAGNOSIS — I4891 Unspecified atrial fibrillation: Secondary | ICD-10-CM | POA: Diagnosis not present

## 2013-11-27 DIAGNOSIS — E1165 Type 2 diabetes mellitus with hyperglycemia: Secondary | ICD-10-CM | POA: Diagnosis not present

## 2013-11-27 DIAGNOSIS — E039 Hypothyroidism, unspecified: Secondary | ICD-10-CM | POA: Diagnosis not present

## 2013-11-27 DIAGNOSIS — E538 Deficiency of other specified B group vitamins: Secondary | ICD-10-CM | POA: Diagnosis not present

## 2013-11-27 DIAGNOSIS — E291 Testicular hypofunction: Secondary | ICD-10-CM | POA: Diagnosis not present

## 2013-11-27 DIAGNOSIS — E559 Vitamin D deficiency, unspecified: Secondary | ICD-10-CM | POA: Diagnosis not present

## 2013-11-27 DIAGNOSIS — E1142 Type 2 diabetes mellitus with diabetic polyneuropathy: Secondary | ICD-10-CM | POA: Diagnosis not present

## 2013-12-04 DIAGNOSIS — L219 Seborrheic dermatitis, unspecified: Secondary | ICD-10-CM | POA: Diagnosis not present

## 2013-12-04 DIAGNOSIS — B351 Tinea unguium: Secondary | ICD-10-CM | POA: Diagnosis not present

## 2013-12-08 DIAGNOSIS — E538 Deficiency of other specified B group vitamins: Secondary | ICD-10-CM | POA: Diagnosis not present

## 2013-12-08 DIAGNOSIS — E291 Testicular hypofunction: Secondary | ICD-10-CM | POA: Diagnosis not present

## 2013-12-19 DIAGNOSIS — E039 Hypothyroidism, unspecified: Secondary | ICD-10-CM | POA: Diagnosis not present

## 2013-12-19 DIAGNOSIS — E538 Deficiency of other specified B group vitamins: Secondary | ICD-10-CM | POA: Diagnosis not present

## 2013-12-28 DIAGNOSIS — I10 Essential (primary) hypertension: Secondary | ICD-10-CM | POA: Diagnosis not present

## 2013-12-28 DIAGNOSIS — Z0000 Encounter for general adult medical examination without abnormal findings: Secondary | ICD-10-CM | POA: Diagnosis not present

## 2013-12-28 DIAGNOSIS — Z125 Encounter for screening for malignant neoplasm of prostate: Secondary | ICD-10-CM | POA: Diagnosis not present

## 2013-12-28 DIAGNOSIS — E1149 Type 2 diabetes mellitus with other diabetic neurological complication: Secondary | ICD-10-CM | POA: Diagnosis not present

## 2013-12-28 DIAGNOSIS — I4891 Unspecified atrial fibrillation: Secondary | ICD-10-CM | POA: Diagnosis not present

## 2013-12-28 DIAGNOSIS — Z7901 Long term (current) use of anticoagulants: Secondary | ICD-10-CM | POA: Diagnosis not present

## 2013-12-28 DIAGNOSIS — E669 Obesity, unspecified: Secondary | ICD-10-CM | POA: Diagnosis not present

## 2014-01-02 DIAGNOSIS — E236 Other disorders of pituitary gland: Secondary | ICD-10-CM | POA: Diagnosis not present

## 2014-01-02 DIAGNOSIS — E538 Deficiency of other specified B group vitamins: Secondary | ICD-10-CM | POA: Diagnosis not present

## 2014-01-16 DIAGNOSIS — E538 Deficiency of other specified B group vitamins: Secondary | ICD-10-CM | POA: Diagnosis not present

## 2014-01-16 DIAGNOSIS — E236 Other disorders of pituitary gland: Secondary | ICD-10-CM | POA: Diagnosis not present

## 2014-01-17 DIAGNOSIS — E220 Acromegaly and pituitary gigantism: Secondary | ICD-10-CM | POA: Diagnosis not present

## 2014-01-17 DIAGNOSIS — H6983 Other specified disorders of Eustachian tube, bilateral: Secondary | ICD-10-CM | POA: Diagnosis not present

## 2014-01-17 DIAGNOSIS — E119 Type 2 diabetes mellitus without complications: Secondary | ICD-10-CM | POA: Diagnosis not present

## 2014-01-17 DIAGNOSIS — H906 Mixed conductive and sensorineural hearing loss, bilateral: Secondary | ICD-10-CM | POA: Diagnosis not present

## 2014-01-17 DIAGNOSIS — H7412 Adhesive left middle ear disease: Secondary | ICD-10-CM | POA: Diagnosis not present

## 2014-01-30 DIAGNOSIS — E039 Hypothyroidism, unspecified: Secondary | ICD-10-CM | POA: Diagnosis not present

## 2014-01-30 DIAGNOSIS — E538 Deficiency of other specified B group vitamins: Secondary | ICD-10-CM | POA: Diagnosis not present

## 2014-02-05 DIAGNOSIS — E1140 Type 2 diabetes mellitus with diabetic neuropathy, unspecified: Secondary | ICD-10-CM | POA: Diagnosis not present

## 2014-02-05 DIAGNOSIS — M6281 Muscle weakness (generalized): Secondary | ICD-10-CM | POA: Diagnosis not present

## 2014-02-05 DIAGNOSIS — M479 Spondylosis, unspecified: Secondary | ICD-10-CM | POA: Diagnosis not present

## 2014-02-08 DIAGNOSIS — I2510 Atherosclerotic heart disease of native coronary artery without angina pectoris: Secondary | ICD-10-CM | POA: Diagnosis not present

## 2014-02-08 DIAGNOSIS — I4891 Unspecified atrial fibrillation: Secondary | ICD-10-CM | POA: Diagnosis not present

## 2014-02-08 DIAGNOSIS — I429 Cardiomyopathy, unspecified: Secondary | ICD-10-CM | POA: Diagnosis not present

## 2014-02-14 DIAGNOSIS — E236 Other disorders of pituitary gland: Secondary | ICD-10-CM | POA: Diagnosis not present

## 2014-02-14 DIAGNOSIS — H6983 Other specified disorders of Eustachian tube, bilateral: Secondary | ICD-10-CM | POA: Diagnosis not present

## 2014-02-14 DIAGNOSIS — H906 Mixed conductive and sensorineural hearing loss, bilateral: Secondary | ICD-10-CM | POA: Diagnosis not present

## 2014-02-14 DIAGNOSIS — Z23 Encounter for immunization: Secondary | ICD-10-CM | POA: Diagnosis not present

## 2014-02-14 DIAGNOSIS — H7412 Adhesive left middle ear disease: Secondary | ICD-10-CM | POA: Diagnosis not present

## 2014-02-14 DIAGNOSIS — E220 Acromegaly and pituitary gigantism: Secondary | ICD-10-CM | POA: Diagnosis not present

## 2014-02-14 DIAGNOSIS — E119 Type 2 diabetes mellitus without complications: Secondary | ICD-10-CM | POA: Diagnosis not present

## 2014-02-14 DIAGNOSIS — E538 Deficiency of other specified B group vitamins: Secondary | ICD-10-CM | POA: Diagnosis not present

## 2014-02-23 ENCOUNTER — Ambulatory Visit: Payer: Medicare Other

## 2014-02-23 DIAGNOSIS — M545 Low back pain: Secondary | ICD-10-CM | POA: Diagnosis not present

## 2014-02-23 DIAGNOSIS — M6281 Muscle weakness (generalized): Secondary | ICD-10-CM | POA: Diagnosis not present

## 2014-02-23 DIAGNOSIS — E1140 Type 2 diabetes mellitus with diabetic neuropathy, unspecified: Secondary | ICD-10-CM | POA: Diagnosis not present

## 2014-02-23 DIAGNOSIS — R2689 Other abnormalities of gait and mobility: Secondary | ICD-10-CM | POA: Diagnosis not present

## 2014-02-26 ENCOUNTER — Ambulatory Visit (INDEPENDENT_AMBULATORY_CARE_PROVIDER_SITE_OTHER): Payer: Medicare Other

## 2014-02-26 VITALS — BP 126/74 | HR 83 | Resp 18

## 2014-02-26 DIAGNOSIS — E1140 Type 2 diabetes mellitus with diabetic neuropathy, unspecified: Secondary | ICD-10-CM | POA: Diagnosis not present

## 2014-02-26 DIAGNOSIS — L603 Nail dystrophy: Secondary | ICD-10-CM

## 2014-02-26 DIAGNOSIS — Q828 Other specified congenital malformations of skin: Secondary | ICD-10-CM

## 2014-02-26 NOTE — Progress Notes (Signed)
   Subjective:    Patient ID: Alan Beltran, male    DOB: 1935/01/23, 80 y.o.   MRN: [id number]  HPI I AM HERE TO GET MY TOENAILS TRIMMED UP    Review of Systems no new findings or systemic changes noted    Objective:   Physical Exam Vascular status is intact and unchanged pedal pulses are palpable nails thick brittle criptotic discolored 1 through 5 right 2 through 5 left open wounds no ulcers no secondary infections. There is history peripheral neuropathy and mild angiopathy. Dystrophic painful mycotic nails debrided at this time. His DP +2 PT 1 over 4 bilateral       Assessment & Plan:  Assessment this time is diabetes history peripheral neuropathy brittle dystrophic friable nails criptotic incurvated debrided 9 return for future palliative care in an as-needed basis nails debridement presence of diabetes and pain in symptomology next  Harriet Masson DPM

## 2014-02-27 DIAGNOSIS — E1142 Type 2 diabetes mellitus with diabetic polyneuropathy: Secondary | ICD-10-CM | POA: Diagnosis not present

## 2014-02-27 DIAGNOSIS — E039 Hypothyroidism, unspecified: Secondary | ICD-10-CM | POA: Diagnosis not present

## 2014-02-27 DIAGNOSIS — E559 Vitamin D deficiency, unspecified: Secondary | ICD-10-CM | POA: Diagnosis not present

## 2014-02-27 DIAGNOSIS — E538 Deficiency of other specified B group vitamins: Secondary | ICD-10-CM | POA: Diagnosis not present

## 2014-02-27 DIAGNOSIS — M109 Gout, unspecified: Secondary | ICD-10-CM | POA: Diagnosis not present

## 2014-02-27 DIAGNOSIS — E291 Testicular hypofunction: Secondary | ICD-10-CM | POA: Diagnosis not present

## 2014-02-28 DIAGNOSIS — R2689 Other abnormalities of gait and mobility: Secondary | ICD-10-CM | POA: Diagnosis not present

## 2014-02-28 DIAGNOSIS — M6281 Muscle weakness (generalized): Secondary | ICD-10-CM | POA: Diagnosis not present

## 2014-02-28 DIAGNOSIS — M545 Low back pain: Secondary | ICD-10-CM | POA: Diagnosis not present

## 2014-02-28 DIAGNOSIS — E1140 Type 2 diabetes mellitus with diabetic neuropathy, unspecified: Secondary | ICD-10-CM | POA: Diagnosis not present

## 2014-03-02 DIAGNOSIS — Z7901 Long term (current) use of anticoagulants: Secondary | ICD-10-CM | POA: Diagnosis not present

## 2014-03-02 DIAGNOSIS — R2689 Other abnormalities of gait and mobility: Secondary | ICD-10-CM | POA: Diagnosis not present

## 2014-03-02 DIAGNOSIS — M545 Low back pain: Secondary | ICD-10-CM | POA: Diagnosis not present

## 2014-03-02 DIAGNOSIS — M6281 Muscle weakness (generalized): Secondary | ICD-10-CM | POA: Diagnosis not present

## 2014-03-02 DIAGNOSIS — E1140 Type 2 diabetes mellitus with diabetic neuropathy, unspecified: Secondary | ICD-10-CM | POA: Diagnosis not present

## 2014-03-05 DIAGNOSIS — M545 Low back pain: Secondary | ICD-10-CM | POA: Diagnosis not present

## 2014-03-05 DIAGNOSIS — M6281 Muscle weakness (generalized): Secondary | ICD-10-CM | POA: Diagnosis not present

## 2014-03-05 DIAGNOSIS — E1140 Type 2 diabetes mellitus with diabetic neuropathy, unspecified: Secondary | ICD-10-CM | POA: Diagnosis not present

## 2014-03-05 DIAGNOSIS — R2689 Other abnormalities of gait and mobility: Secondary | ICD-10-CM | POA: Diagnosis not present

## 2014-03-07 DIAGNOSIS — M79604 Pain in right leg: Secondary | ICD-10-CM | POA: Diagnosis not present

## 2014-03-09 DIAGNOSIS — E1140 Type 2 diabetes mellitus with diabetic neuropathy, unspecified: Secondary | ICD-10-CM | POA: Diagnosis not present

## 2014-03-09 DIAGNOSIS — M6281 Muscle weakness (generalized): Secondary | ICD-10-CM | POA: Diagnosis not present

## 2014-03-09 DIAGNOSIS — R2689 Other abnormalities of gait and mobility: Secondary | ICD-10-CM | POA: Diagnosis not present

## 2014-03-09 DIAGNOSIS — M545 Low back pain: Secondary | ICD-10-CM | POA: Diagnosis not present

## 2014-03-13 DIAGNOSIS — H906 Mixed conductive and sensorineural hearing loss, bilateral: Secondary | ICD-10-CM | POA: Diagnosis not present

## 2014-03-13 DIAGNOSIS — E538 Deficiency of other specified B group vitamins: Secondary | ICD-10-CM | POA: Diagnosis not present

## 2014-03-13 DIAGNOSIS — H7412 Adhesive left middle ear disease: Secondary | ICD-10-CM | POA: Diagnosis not present

## 2014-03-13 DIAGNOSIS — E119 Type 2 diabetes mellitus without complications: Secondary | ICD-10-CM | POA: Diagnosis not present

## 2014-03-13 DIAGNOSIS — H6983 Other specified disorders of Eustachian tube, bilateral: Secondary | ICD-10-CM | POA: Diagnosis not present

## 2014-03-13 DIAGNOSIS — E220 Acromegaly and pituitary gigantism: Secondary | ICD-10-CM | POA: Diagnosis not present

## 2014-03-13 DIAGNOSIS — E236 Other disorders of pituitary gland: Secondary | ICD-10-CM | POA: Diagnosis not present

## 2014-03-14 DIAGNOSIS — M6281 Muscle weakness (generalized): Secondary | ICD-10-CM | POA: Diagnosis not present

## 2014-03-14 DIAGNOSIS — M545 Low back pain: Secondary | ICD-10-CM | POA: Diagnosis not present

## 2014-03-14 DIAGNOSIS — R2689 Other abnormalities of gait and mobility: Secondary | ICD-10-CM | POA: Diagnosis not present

## 2014-03-14 DIAGNOSIS — E1140 Type 2 diabetes mellitus with diabetic neuropathy, unspecified: Secondary | ICD-10-CM | POA: Diagnosis not present

## 2014-03-15 DIAGNOSIS — M479 Spondylosis, unspecified: Secondary | ICD-10-CM | POA: Diagnosis not present

## 2014-03-15 DIAGNOSIS — E1140 Type 2 diabetes mellitus with diabetic neuropathy, unspecified: Secondary | ICD-10-CM | POA: Diagnosis not present

## 2014-03-16 DIAGNOSIS — M545 Low back pain: Secondary | ICD-10-CM | POA: Diagnosis not present

## 2014-03-16 DIAGNOSIS — E1140 Type 2 diabetes mellitus with diabetic neuropathy, unspecified: Secondary | ICD-10-CM | POA: Diagnosis not present

## 2014-03-16 DIAGNOSIS — R2689 Other abnormalities of gait and mobility: Secondary | ICD-10-CM | POA: Diagnosis not present

## 2014-03-16 DIAGNOSIS — M6281 Muscle weakness (generalized): Secondary | ICD-10-CM | POA: Diagnosis not present

## 2014-03-19 DIAGNOSIS — R2689 Other abnormalities of gait and mobility: Secondary | ICD-10-CM | POA: Diagnosis not present

## 2014-03-19 DIAGNOSIS — M545 Low back pain: Secondary | ICD-10-CM | POA: Diagnosis not present

## 2014-03-19 DIAGNOSIS — E1140 Type 2 diabetes mellitus with diabetic neuropathy, unspecified: Secondary | ICD-10-CM | POA: Diagnosis not present

## 2014-03-19 DIAGNOSIS — M6281 Muscle weakness (generalized): Secondary | ICD-10-CM | POA: Diagnosis not present

## 2014-03-23 DIAGNOSIS — M6281 Muscle weakness (generalized): Secondary | ICD-10-CM | POA: Diagnosis not present

## 2014-03-23 DIAGNOSIS — R2689 Other abnormalities of gait and mobility: Secondary | ICD-10-CM | POA: Diagnosis not present

## 2014-03-23 DIAGNOSIS — M545 Low back pain: Secondary | ICD-10-CM | POA: Diagnosis not present

## 2014-03-23 DIAGNOSIS — E1140 Type 2 diabetes mellitus with diabetic neuropathy, unspecified: Secondary | ICD-10-CM | POA: Diagnosis not present

## 2014-03-26 DIAGNOSIS — E1140 Type 2 diabetes mellitus with diabetic neuropathy, unspecified: Secondary | ICD-10-CM | POA: Diagnosis not present

## 2014-03-26 DIAGNOSIS — M6281 Muscle weakness (generalized): Secondary | ICD-10-CM | POA: Diagnosis not present

## 2014-03-26 DIAGNOSIS — R2689 Other abnormalities of gait and mobility: Secondary | ICD-10-CM | POA: Diagnosis not present

## 2014-03-26 DIAGNOSIS — M545 Low back pain: Secondary | ICD-10-CM | POA: Diagnosis not present

## 2014-03-27 DIAGNOSIS — E236 Other disorders of pituitary gland: Secondary | ICD-10-CM | POA: Diagnosis not present

## 2014-03-27 DIAGNOSIS — E538 Deficiency of other specified B group vitamins: Secondary | ICD-10-CM | POA: Diagnosis not present

## 2014-03-30 DIAGNOSIS — E1140 Type 2 diabetes mellitus with diabetic neuropathy, unspecified: Secondary | ICD-10-CM | POA: Diagnosis not present

## 2014-03-30 DIAGNOSIS — M6281 Muscle weakness (generalized): Secondary | ICD-10-CM | POA: Diagnosis not present

## 2014-03-30 DIAGNOSIS — M545 Low back pain: Secondary | ICD-10-CM | POA: Diagnosis not present

## 2014-03-30 DIAGNOSIS — R2689 Other abnormalities of gait and mobility: Secondary | ICD-10-CM | POA: Diagnosis not present

## 2014-04-02 DIAGNOSIS — M545 Low back pain: Secondary | ICD-10-CM | POA: Diagnosis not present

## 2014-04-02 DIAGNOSIS — R2689 Other abnormalities of gait and mobility: Secondary | ICD-10-CM | POA: Diagnosis not present

## 2014-04-02 DIAGNOSIS — E1140 Type 2 diabetes mellitus with diabetic neuropathy, unspecified: Secondary | ICD-10-CM | POA: Diagnosis not present

## 2014-04-02 DIAGNOSIS — M6281 Muscle weakness (generalized): Secondary | ICD-10-CM | POA: Diagnosis not present

## 2014-04-04 DIAGNOSIS — I4892 Unspecified atrial flutter: Secondary | ICD-10-CM | POA: Diagnosis not present

## 2014-04-04 DIAGNOSIS — I2510 Atherosclerotic heart disease of native coronary artery without angina pectoris: Secondary | ICD-10-CM | POA: Diagnosis not present

## 2014-04-06 DIAGNOSIS — M545 Low back pain: Secondary | ICD-10-CM | POA: Diagnosis not present

## 2014-04-06 DIAGNOSIS — R2689 Other abnormalities of gait and mobility: Secondary | ICD-10-CM | POA: Diagnosis not present

## 2014-04-06 DIAGNOSIS — E1140 Type 2 diabetes mellitus with diabetic neuropathy, unspecified: Secondary | ICD-10-CM | POA: Diagnosis not present

## 2014-04-06 DIAGNOSIS — M6281 Muscle weakness (generalized): Secondary | ICD-10-CM | POA: Diagnosis not present

## 2014-04-09 DIAGNOSIS — E1140 Type 2 diabetes mellitus with diabetic neuropathy, unspecified: Secondary | ICD-10-CM | POA: Diagnosis not present

## 2014-04-09 DIAGNOSIS — M6281 Muscle weakness (generalized): Secondary | ICD-10-CM | POA: Diagnosis not present

## 2014-04-09 DIAGNOSIS — R2689 Other abnormalities of gait and mobility: Secondary | ICD-10-CM | POA: Diagnosis not present

## 2014-04-09 DIAGNOSIS — M545 Low back pain: Secondary | ICD-10-CM | POA: Diagnosis not present

## 2014-04-10 DIAGNOSIS — H906 Mixed conductive and sensorineural hearing loss, bilateral: Secondary | ICD-10-CM | POA: Diagnosis not present

## 2014-04-10 DIAGNOSIS — E236 Other disorders of pituitary gland: Secondary | ICD-10-CM | POA: Diagnosis not present

## 2014-04-10 DIAGNOSIS — E220 Acromegaly and pituitary gigantism: Secondary | ICD-10-CM | POA: Diagnosis not present

## 2014-04-10 DIAGNOSIS — E119 Type 2 diabetes mellitus without complications: Secondary | ICD-10-CM | POA: Diagnosis not present

## 2014-04-10 DIAGNOSIS — E538 Deficiency of other specified B group vitamins: Secondary | ICD-10-CM | POA: Diagnosis not present

## 2014-04-10 DIAGNOSIS — H6983 Other specified disorders of Eustachian tube, bilateral: Secondary | ICD-10-CM | POA: Diagnosis not present

## 2014-04-10 DIAGNOSIS — H7412 Adhesive left middle ear disease: Secondary | ICD-10-CM | POA: Diagnosis not present

## 2014-04-10 DIAGNOSIS — H6505 Acute serous otitis media, recurrent, left ear: Secondary | ICD-10-CM | POA: Diagnosis not present

## 2014-04-11 DIAGNOSIS — M6281 Muscle weakness (generalized): Secondary | ICD-10-CM | POA: Diagnosis not present

## 2014-04-11 DIAGNOSIS — R2689 Other abnormalities of gait and mobility: Secondary | ICD-10-CM | POA: Diagnosis not present

## 2014-04-11 DIAGNOSIS — E1140 Type 2 diabetes mellitus with diabetic neuropathy, unspecified: Secondary | ICD-10-CM | POA: Diagnosis not present

## 2014-04-11 DIAGNOSIS — M545 Low back pain: Secondary | ICD-10-CM | POA: Diagnosis not present

## 2014-04-16 DIAGNOSIS — M6281 Muscle weakness (generalized): Secondary | ICD-10-CM | POA: Diagnosis not present

## 2014-04-16 DIAGNOSIS — E1140 Type 2 diabetes mellitus with diabetic neuropathy, unspecified: Secondary | ICD-10-CM | POA: Diagnosis not present

## 2014-04-16 DIAGNOSIS — R2689 Other abnormalities of gait and mobility: Secondary | ICD-10-CM | POA: Diagnosis not present

## 2014-04-16 DIAGNOSIS — M545 Low back pain: Secondary | ICD-10-CM | POA: Diagnosis not present

## 2014-04-19 DIAGNOSIS — R2689 Other abnormalities of gait and mobility: Secondary | ICD-10-CM | POA: Diagnosis not present

## 2014-04-19 DIAGNOSIS — M6281 Muscle weakness (generalized): Secondary | ICD-10-CM | POA: Diagnosis not present

## 2014-04-19 DIAGNOSIS — E1140 Type 2 diabetes mellitus with diabetic neuropathy, unspecified: Secondary | ICD-10-CM | POA: Diagnosis not present

## 2014-04-19 DIAGNOSIS — M545 Low back pain: Secondary | ICD-10-CM | POA: Diagnosis not present

## 2014-04-24 DIAGNOSIS — E236 Other disorders of pituitary gland: Secondary | ICD-10-CM | POA: Diagnosis not present

## 2014-04-24 DIAGNOSIS — E538 Deficiency of other specified B group vitamins: Secondary | ICD-10-CM | POA: Diagnosis not present

## 2014-04-25 DIAGNOSIS — M6281 Muscle weakness (generalized): Secondary | ICD-10-CM | POA: Diagnosis not present

## 2014-04-25 DIAGNOSIS — E1140 Type 2 diabetes mellitus with diabetic neuropathy, unspecified: Secondary | ICD-10-CM | POA: Diagnosis not present

## 2014-04-25 DIAGNOSIS — R2689 Other abnormalities of gait and mobility: Secondary | ICD-10-CM | POA: Diagnosis not present

## 2014-04-25 DIAGNOSIS — M545 Low back pain: Secondary | ICD-10-CM | POA: Diagnosis not present

## 2014-04-27 DIAGNOSIS — E1140 Type 2 diabetes mellitus with diabetic neuropathy, unspecified: Secondary | ICD-10-CM | POA: Diagnosis not present

## 2014-04-27 DIAGNOSIS — M6281 Muscle weakness (generalized): Secondary | ICD-10-CM | POA: Diagnosis not present

## 2014-04-27 DIAGNOSIS — M545 Low back pain: Secondary | ICD-10-CM | POA: Diagnosis not present

## 2014-04-27 DIAGNOSIS — R2689 Other abnormalities of gait and mobility: Secondary | ICD-10-CM | POA: Diagnosis not present

## 2014-05-02 DIAGNOSIS — M545 Low back pain: Secondary | ICD-10-CM | POA: Diagnosis not present

## 2014-05-02 DIAGNOSIS — R2689 Other abnormalities of gait and mobility: Secondary | ICD-10-CM | POA: Diagnosis not present

## 2014-05-02 DIAGNOSIS — E1140 Type 2 diabetes mellitus with diabetic neuropathy, unspecified: Secondary | ICD-10-CM | POA: Diagnosis not present

## 2014-05-02 DIAGNOSIS — M6281 Muscle weakness (generalized): Secondary | ICD-10-CM | POA: Diagnosis not present

## 2014-05-04 DIAGNOSIS — M545 Low back pain: Secondary | ICD-10-CM | POA: Diagnosis not present

## 2014-05-04 DIAGNOSIS — M6281 Muscle weakness (generalized): Secondary | ICD-10-CM | POA: Diagnosis not present

## 2014-05-04 DIAGNOSIS — R2689 Other abnormalities of gait and mobility: Secondary | ICD-10-CM | POA: Diagnosis not present

## 2014-05-04 DIAGNOSIS — E1140 Type 2 diabetes mellitus with diabetic neuropathy, unspecified: Secondary | ICD-10-CM | POA: Diagnosis not present

## 2014-05-07 DIAGNOSIS — I83893 Varicose veins of bilateral lower extremities with other complications: Secondary | ICD-10-CM | POA: Diagnosis not present

## 2014-05-09 DIAGNOSIS — H6983 Other specified disorders of Eustachian tube, bilateral: Secondary | ICD-10-CM | POA: Diagnosis not present

## 2014-05-09 DIAGNOSIS — E538 Deficiency of other specified B group vitamins: Secondary | ICD-10-CM | POA: Diagnosis not present

## 2014-05-09 DIAGNOSIS — E119 Type 2 diabetes mellitus without complications: Secondary | ICD-10-CM | POA: Diagnosis not present

## 2014-05-09 DIAGNOSIS — H7412 Adhesive left middle ear disease: Secondary | ICD-10-CM | POA: Diagnosis not present

## 2014-05-09 DIAGNOSIS — E220 Acromegaly and pituitary gigantism: Secondary | ICD-10-CM | POA: Diagnosis not present

## 2014-05-09 DIAGNOSIS — E291 Testicular hypofunction: Secondary | ICD-10-CM | POA: Diagnosis not present

## 2014-05-09 DIAGNOSIS — H906 Mixed conductive and sensorineural hearing loss, bilateral: Secondary | ICD-10-CM | POA: Diagnosis not present

## 2014-05-22 DIAGNOSIS — E236 Other disorders of pituitary gland: Secondary | ICD-10-CM | POA: Diagnosis not present

## 2014-05-22 DIAGNOSIS — E538 Deficiency of other specified B group vitamins: Secondary | ICD-10-CM | POA: Diagnosis not present

## 2014-05-25 DIAGNOSIS — M79669 Pain in unspecified lower leg: Secondary | ICD-10-CM | POA: Diagnosis not present

## 2014-05-28 DIAGNOSIS — Z7901 Long term (current) use of anticoagulants: Secondary | ICD-10-CM | POA: Diagnosis not present

## 2014-05-29 DIAGNOSIS — E1142 Type 2 diabetes mellitus with diabetic polyneuropathy: Secondary | ICD-10-CM | POA: Diagnosis not present

## 2014-05-29 DIAGNOSIS — E039 Hypothyroidism, unspecified: Secondary | ICD-10-CM | POA: Diagnosis not present

## 2014-05-29 DIAGNOSIS — E538 Deficiency of other specified B group vitamins: Secondary | ICD-10-CM | POA: Diagnosis not present

## 2014-05-29 DIAGNOSIS — E559 Vitamin D deficiency, unspecified: Secondary | ICD-10-CM | POA: Diagnosis not present

## 2014-05-29 DIAGNOSIS — E291 Testicular hypofunction: Secondary | ICD-10-CM | POA: Diagnosis not present

## 2014-05-31 DIAGNOSIS — N281 Cyst of kidney, acquired: Secondary | ICD-10-CM | POA: Diagnosis not present

## 2014-05-31 DIAGNOSIS — I7092 Chronic total occlusion of artery of the extremities: Secondary | ICD-10-CM | POA: Diagnosis not present

## 2014-05-31 DIAGNOSIS — I70213 Atherosclerosis of native arteries of extremities with intermittent claudication, bilateral legs: Secondary | ICD-10-CM | POA: Diagnosis not present

## 2014-05-31 DIAGNOSIS — K5730 Diverticulosis of large intestine without perforation or abscess without bleeding: Secondary | ICD-10-CM | POA: Diagnosis not present

## 2014-05-31 DIAGNOSIS — K8020 Calculus of gallbladder without cholecystitis without obstruction: Secondary | ICD-10-CM | POA: Diagnosis not present

## 2014-05-31 DIAGNOSIS — N400 Enlarged prostate without lower urinary tract symptoms: Secondary | ICD-10-CM | POA: Diagnosis not present

## 2014-05-31 DIAGNOSIS — M5136 Other intervertebral disc degeneration, lumbar region: Secondary | ICD-10-CM | POA: Diagnosis not present

## 2014-06-01 ENCOUNTER — Encounter: Payer: Self-pay | Admitting: Podiatrist

## 2014-06-01 ENCOUNTER — Ambulatory Visit (INDEPENDENT_AMBULATORY_CARE_PROVIDER_SITE_OTHER): Payer: Medicare Other | Admitting: Podiatrist

## 2014-06-01 VITALS — BP 127/77 | HR 77 | Resp 18

## 2014-06-01 DIAGNOSIS — B351 Tinea unguium: Secondary | ICD-10-CM | POA: Diagnosis not present

## 2014-06-01 DIAGNOSIS — M79676 Pain in unspecified toe(s): Secondary | ICD-10-CM | POA: Diagnosis not present

## 2014-06-01 DIAGNOSIS — E1140 Type 2 diabetes mellitus with diabetic neuropathy, unspecified: Secondary | ICD-10-CM | POA: Diagnosis not present

## 2014-06-01 DIAGNOSIS — L603 Nail dystrophy: Secondary | ICD-10-CM

## 2014-06-01 NOTE — Progress Notes (Signed)
HPI: Patient presents today for follow up of diabetic foot and nail care. Past medical history, meds, and allergies reviewed.  Objective:   Objective:  Patients chart is reviewed.  Vascular status reveals pedal pulses noted at  1 out of 4 dp and pt bilateral .  Neurological sensation is Decreased to [employer] monofilament bilateral at 2/5 sites bilateral.  Dermatological exam reveals  absence of pre ulcerative/ hyperkeratotic lesions.   Toenails are elongated, incurvated, discolored, dystrophic with ingrown deformity present.    Assessment: Diabetes with Neuropathy/Angiopathy , Ingrown nail deformity  Plan: Discussed treatment options and alternatives. Debrided nails without complication.   Return appointment recommended at routine intervals of 3 months.   Trudie Buckler, DPM

## 2014-06-04 DIAGNOSIS — E118 Type 2 diabetes mellitus with unspecified complications: Secondary | ICD-10-CM | POA: Diagnosis not present

## 2014-06-05 DIAGNOSIS — H7412 Adhesive left middle ear disease: Secondary | ICD-10-CM | POA: Diagnosis not present

## 2014-06-05 DIAGNOSIS — H906 Mixed conductive and sensorineural hearing loss, bilateral: Secondary | ICD-10-CM | POA: Diagnosis not present

## 2014-06-05 DIAGNOSIS — H6983 Other specified disorders of Eustachian tube, bilateral: Secondary | ICD-10-CM | POA: Diagnosis not present

## 2014-06-05 DIAGNOSIS — E220 Acromegaly and pituitary gigantism: Secondary | ICD-10-CM | POA: Diagnosis not present

## 2014-06-05 DIAGNOSIS — E119 Type 2 diabetes mellitus without complications: Secondary | ICD-10-CM | POA: Diagnosis not present

## 2014-06-05 DIAGNOSIS — E236 Other disorders of pituitary gland: Secondary | ICD-10-CM | POA: Diagnosis not present

## 2014-06-05 DIAGNOSIS — E538 Deficiency of other specified B group vitamins: Secondary | ICD-10-CM | POA: Diagnosis not present

## 2014-06-07 DIAGNOSIS — R29898 Other symptoms and signs involving the musculoskeletal system: Secondary | ICD-10-CM | POA: Diagnosis not present

## 2014-06-07 DIAGNOSIS — R262 Difficulty in walking, not elsewhere classified: Secondary | ICD-10-CM | POA: Diagnosis not present

## 2014-06-18 DIAGNOSIS — H35373 Puckering of macula, bilateral: Secondary | ICD-10-CM | POA: Diagnosis not present

## 2014-06-18 DIAGNOSIS — E119 Type 2 diabetes mellitus without complications: Secondary | ICD-10-CM | POA: Diagnosis not present

## 2014-06-19 DIAGNOSIS — E039 Hypothyroidism, unspecified: Secondary | ICD-10-CM | POA: Diagnosis not present

## 2014-06-19 DIAGNOSIS — E538 Deficiency of other specified B group vitamins: Secondary | ICD-10-CM | POA: Diagnosis not present

## 2014-07-03 DIAGNOSIS — E538 Deficiency of other specified B group vitamins: Secondary | ICD-10-CM | POA: Diagnosis not present

## 2014-07-03 DIAGNOSIS — E291 Testicular hypofunction: Secondary | ICD-10-CM | POA: Diagnosis not present

## 2014-07-09 ENCOUNTER — Encounter: Payer: Self-pay | Admitting: Podiatry

## 2014-07-09 ENCOUNTER — Ambulatory Visit (INDEPENDENT_AMBULATORY_CARE_PROVIDER_SITE_OTHER): Payer: Medicare Other | Admitting: Podiatry

## 2014-07-09 DIAGNOSIS — E1140 Type 2 diabetes mellitus with diabetic neuropathy, unspecified: Secondary | ICD-10-CM

## 2014-07-09 NOTE — Progress Notes (Signed)
Subjective:     Patient ID: Alan Beltran, male   DOB: 01-Feb-1934, 80 y.o.   MRN: [id number]  HPI  Patient returns to be measured for diabetic shoes by pedorthist.   Review of Systems     Objective:   Physical Exam Neuropathy noted.    Assessment:     Diabetes with neuropathy     Plan:     Measure for shoes

## 2014-07-13 DIAGNOSIS — E538 Deficiency of other specified B group vitamins: Secondary | ICD-10-CM | POA: Diagnosis not present

## 2014-07-13 DIAGNOSIS — E236 Other disorders of pituitary gland: Secondary | ICD-10-CM | POA: Diagnosis not present

## 2014-07-31 DIAGNOSIS — E538 Deficiency of other specified B group vitamins: Secondary | ICD-10-CM | POA: Diagnosis not present

## 2014-07-31 DIAGNOSIS — E291 Testicular hypofunction: Secondary | ICD-10-CM | POA: Diagnosis not present

## 2014-08-01 DIAGNOSIS — G2581 Restless legs syndrome: Secondary | ICD-10-CM | POA: Diagnosis not present

## 2014-08-01 DIAGNOSIS — G4731 Primary central sleep apnea: Secondary | ICD-10-CM | POA: Diagnosis not present

## 2014-08-01 DIAGNOSIS — J45909 Unspecified asthma, uncomplicated: Secondary | ICD-10-CM | POA: Diagnosis not present

## 2014-08-06 ENCOUNTER — Other Ambulatory Visit: Payer: Medicare Other

## 2014-08-13 DIAGNOSIS — M6281 Muscle weakness (generalized): Secondary | ICD-10-CM | POA: Diagnosis not present

## 2014-08-13 DIAGNOSIS — M5136 Other intervertebral disc degeneration, lumbar region: Secondary | ICD-10-CM | POA: Diagnosis not present

## 2014-08-13 DIAGNOSIS — M47816 Spondylosis without myelopathy or radiculopathy, lumbar region: Secondary | ICD-10-CM | POA: Diagnosis not present

## 2014-08-13 DIAGNOSIS — M5126 Other intervertebral disc displacement, lumbar region: Secondary | ICD-10-CM | POA: Diagnosis not present

## 2014-08-13 DIAGNOSIS — M4696 Unspecified inflammatory spondylopathy, lumbar region: Secondary | ICD-10-CM | POA: Diagnosis not present

## 2014-08-15 DIAGNOSIS — E871 Hypo-osmolality and hyponatremia: Secondary | ICD-10-CM | POA: Diagnosis not present

## 2014-08-15 DIAGNOSIS — Z7901 Long term (current) use of anticoagulants: Secondary | ICD-10-CM | POA: Diagnosis not present

## 2014-08-15 DIAGNOSIS — M25462 Effusion, left knee: Secondary | ICD-10-CM | POA: Diagnosis not present

## 2014-08-15 DIAGNOSIS — M179 Osteoarthritis of knee, unspecified: Secondary | ICD-10-CM | POA: Diagnosis present

## 2014-08-15 DIAGNOSIS — M47812 Spondylosis without myelopathy or radiculopathy, cervical region: Secondary | ICD-10-CM | POA: Diagnosis not present

## 2014-08-15 DIAGNOSIS — E039 Hypothyroidism, unspecified: Secondary | ICD-10-CM | POA: Diagnosis present

## 2014-08-15 DIAGNOSIS — I618 Other nontraumatic intracerebral hemorrhage: Secondary | ICD-10-CM | POA: Diagnosis not present

## 2014-08-15 DIAGNOSIS — E876 Hypokalemia: Secondary | ICD-10-CM | POA: Diagnosis present

## 2014-08-15 DIAGNOSIS — R4182 Altered mental status, unspecified: Secondary | ICD-10-CM | POA: Diagnosis not present

## 2014-08-15 DIAGNOSIS — E291 Testicular hypofunction: Secondary | ICD-10-CM | POA: Diagnosis present

## 2014-08-15 DIAGNOSIS — G2581 Restless legs syndrome: Secondary | ICD-10-CM | POA: Diagnosis present

## 2014-08-15 DIAGNOSIS — I517 Cardiomegaly: Secondary | ICD-10-CM | POA: Diagnosis not present

## 2014-08-15 DIAGNOSIS — Z043 Encounter for examination and observation following other accident: Secondary | ICD-10-CM | POA: Diagnosis not present

## 2014-08-15 DIAGNOSIS — M109 Gout, unspecified: Secondary | ICD-10-CM | POA: Diagnosis present

## 2014-08-15 DIAGNOSIS — Z951 Presence of aortocoronary bypass graft: Secondary | ICD-10-CM | POA: Diagnosis not present

## 2014-08-15 DIAGNOSIS — I2510 Atherosclerotic heart disease of native coronary artery without angina pectoris: Secondary | ICD-10-CM | POA: Diagnosis not present

## 2014-08-15 DIAGNOSIS — E1140 Type 2 diabetes mellitus with diabetic neuropathy, unspecified: Secondary | ICD-10-CM | POA: Diagnosis present

## 2014-08-15 DIAGNOSIS — F329 Major depressive disorder, single episode, unspecified: Secondary | ICD-10-CM | POA: Diagnosis present

## 2014-08-15 DIAGNOSIS — I6782 Cerebral ischemia: Secondary | ICD-10-CM | POA: Diagnosis not present

## 2014-08-15 DIAGNOSIS — G936 Cerebral edema: Secondary | ICD-10-CM | POA: Diagnosis not present

## 2014-08-15 DIAGNOSIS — J45909 Unspecified asthma, uncomplicated: Secondary | ICD-10-CM | POA: Diagnosis present

## 2014-08-15 DIAGNOSIS — R220 Localized swelling, mass and lump, head: Secondary | ICD-10-CM | POA: Diagnosis not present

## 2014-08-15 DIAGNOSIS — I480 Paroxysmal atrial fibrillation: Secondary | ICD-10-CM | POA: Diagnosis present

## 2014-08-15 DIAGNOSIS — R40241 Glasgow coma scale score 13-15: Secondary | ICD-10-CM | POA: Diagnosis not present

## 2014-08-15 DIAGNOSIS — T45515A Adverse effect of anticoagulants, initial encounter: Secondary | ICD-10-CM | POA: Diagnosis present

## 2014-08-15 DIAGNOSIS — Z66 Do not resuscitate: Secondary | ICD-10-CM | POA: Diagnosis present

## 2014-08-15 DIAGNOSIS — I615 Nontraumatic intracerebral hemorrhage, intraventricular: Secondary | ICD-10-CM | POA: Diagnosis not present

## 2014-08-15 DIAGNOSIS — R269 Unspecified abnormalities of gait and mobility: Secondary | ICD-10-CM | POA: Diagnosis present

## 2014-08-15 DIAGNOSIS — I629 Nontraumatic intracranial hemorrhage, unspecified: Secondary | ICD-10-CM | POA: Diagnosis not present

## 2014-08-15 DIAGNOSIS — M25562 Pain in left knee: Secondary | ICD-10-CM | POA: Diagnosis not present

## 2014-08-15 DIAGNOSIS — K219 Gastro-esophageal reflux disease without esophagitis: Secondary | ICD-10-CM | POA: Diagnosis present

## 2014-08-15 DIAGNOSIS — D420 Neoplasm of uncertain behavior of cerebral meninges: Secondary | ICD-10-CM | POA: Diagnosis present

## 2014-08-15 DIAGNOSIS — J449 Chronic obstructive pulmonary disease, unspecified: Secondary | ICD-10-CM | POA: Diagnosis present

## 2014-08-15 DIAGNOSIS — R51 Headache: Secondary | ICD-10-CM | POA: Diagnosis not present

## 2014-08-15 DIAGNOSIS — E222 Syndrome of inappropriate secretion of antidiuretic hormone: Secondary | ICD-10-CM | POA: Diagnosis not present

## 2014-08-15 DIAGNOSIS — I6200 Nontraumatic subdural hemorrhage, unspecified: Secondary | ICD-10-CM | POA: Diagnosis not present

## 2014-08-15 DIAGNOSIS — I10 Essential (primary) hypertension: Secondary | ICD-10-CM | POA: Diagnosis present

## 2014-08-15 DIAGNOSIS — I614 Nontraumatic intracerebral hemorrhage in cerebellum: Secondary | ICD-10-CM | POA: Diagnosis not present

## 2014-08-15 DIAGNOSIS — I5033 Acute on chronic diastolic (congestive) heart failure: Secondary | ICD-10-CM | POA: Diagnosis not present

## 2014-08-15 DIAGNOSIS — R410 Disorientation, unspecified: Secondary | ICD-10-CM | POA: Diagnosis present

## 2014-08-15 DIAGNOSIS — H7093 Unspecified mastoiditis, bilateral: Secondary | ICD-10-CM | POA: Diagnosis not present

## 2014-08-22 DIAGNOSIS — F329 Major depressive disorder, single episode, unspecified: Secondary | ICD-10-CM | POA: Diagnosis present

## 2014-08-22 DIAGNOSIS — N472 Paraphimosis: Secondary | ICD-10-CM | POA: Diagnosis not present

## 2014-08-22 DIAGNOSIS — R338 Other retention of urine: Secondary | ICD-10-CM | POA: Diagnosis present

## 2014-08-22 DIAGNOSIS — E222 Syndrome of inappropriate secretion of antidiuretic hormone: Secondary | ICD-10-CM | POA: Diagnosis not present

## 2014-08-22 DIAGNOSIS — N390 Urinary tract infection, site not specified: Secondary | ICD-10-CM | POA: Diagnosis present

## 2014-08-22 DIAGNOSIS — I5032 Chronic diastolic (congestive) heart failure: Secondary | ICD-10-CM | POA: Diagnosis not present

## 2014-08-22 DIAGNOSIS — I629 Nontraumatic intracranial hemorrhage, unspecified: Secondary | ICD-10-CM | POA: Diagnosis not present

## 2014-08-22 DIAGNOSIS — M7989 Other specified soft tissue disorders: Secondary | ICD-10-CM | POA: Diagnosis not present

## 2014-08-22 DIAGNOSIS — Z7901 Long term (current) use of anticoagulants: Secondary | ICD-10-CM | POA: Diagnosis not present

## 2014-08-22 DIAGNOSIS — R220 Localized swelling, mass and lump, head: Secondary | ICD-10-CM | POA: Diagnosis not present

## 2014-08-22 DIAGNOSIS — R52 Pain, unspecified: Secondary | ICD-10-CM | POA: Diagnosis not present

## 2014-08-22 DIAGNOSIS — I2510 Atherosclerotic heart disease of native coronary artery without angina pectoris: Secondary | ICD-10-CM | POA: Diagnosis present

## 2014-08-22 DIAGNOSIS — G9389 Other specified disorders of brain: Secondary | ICD-10-CM | POA: Diagnosis not present

## 2014-08-22 DIAGNOSIS — R339 Retention of urine, unspecified: Secondary | ICD-10-CM | POA: Diagnosis not present

## 2014-08-22 DIAGNOSIS — Z8673 Personal history of transient ischemic attack (TIA), and cerebral infarction without residual deficits: Secondary | ICD-10-CM | POA: Diagnosis not present

## 2014-08-22 DIAGNOSIS — R509 Fever, unspecified: Secondary | ICD-10-CM | POA: Diagnosis not present

## 2014-08-22 DIAGNOSIS — N401 Enlarged prostate with lower urinary tract symptoms: Secondary | ICD-10-CM | POA: Diagnosis present

## 2014-08-22 DIAGNOSIS — G2581 Restless legs syndrome: Secondary | ICD-10-CM | POA: Diagnosis present

## 2014-08-22 DIAGNOSIS — K5900 Constipation, unspecified: Secondary | ICD-10-CM | POA: Diagnosis present

## 2014-08-22 DIAGNOSIS — N39498 Other specified urinary incontinence: Secondary | ICD-10-CM | POA: Diagnosis present

## 2014-08-22 DIAGNOSIS — E039 Hypothyroidism, unspecified: Secondary | ICD-10-CM | POA: Diagnosis not present

## 2014-08-22 DIAGNOSIS — J449 Chronic obstructive pulmonary disease, unspecified: Secondary | ICD-10-CM | POA: Diagnosis present

## 2014-08-22 DIAGNOSIS — I480 Paroxysmal atrial fibrillation: Secondary | ICD-10-CM | POA: Diagnosis present

## 2014-08-22 DIAGNOSIS — Z9181 History of falling: Secondary | ICD-10-CM | POA: Diagnosis not present

## 2014-08-22 DIAGNOSIS — K219 Gastro-esophageal reflux disease without esophagitis: Secondary | ICD-10-CM | POA: Diagnosis present

## 2014-08-22 DIAGNOSIS — I618 Other nontraumatic intracerebral hemorrhage: Secondary | ICD-10-CM | POA: Diagnosis not present

## 2014-08-22 DIAGNOSIS — G4733 Obstructive sleep apnea (adult) (pediatric): Secondary | ICD-10-CM | POA: Diagnosis present

## 2014-08-22 DIAGNOSIS — E1140 Type 2 diabetes mellitus with diabetic neuropathy, unspecified: Secondary | ICD-10-CM | POA: Diagnosis not present

## 2014-08-22 DIAGNOSIS — Z951 Presence of aortocoronary bypass graft: Secondary | ICD-10-CM | POA: Diagnosis not present

## 2014-08-22 DIAGNOSIS — Z794 Long term (current) use of insulin: Secondary | ICD-10-CM | POA: Diagnosis not present

## 2014-08-22 DIAGNOSIS — Z5189 Encounter for other specified aftercare: Secondary | ICD-10-CM | POA: Diagnosis not present

## 2014-08-22 DIAGNOSIS — M109 Gout, unspecified: Secondary | ICD-10-CM | POA: Diagnosis present

## 2014-08-22 DIAGNOSIS — I509 Heart failure, unspecified: Secondary | ICD-10-CM | POA: Diagnosis not present

## 2014-08-22 DIAGNOSIS — E871 Hypo-osmolality and hyponatremia: Secondary | ICD-10-CM | POA: Diagnosis present

## 2014-08-22 DIAGNOSIS — R4182 Altered mental status, unspecified: Secondary | ICD-10-CM | POA: Diagnosis not present

## 2014-09-03 ENCOUNTER — Other Ambulatory Visit: Payer: Medicare Other

## 2014-09-12 DIAGNOSIS — N472 Paraphimosis: Secondary | ICD-10-CM | POA: Diagnosis not present

## 2014-09-12 DIAGNOSIS — R339 Retention of urine, unspecified: Secondary | ICD-10-CM | POA: Diagnosis not present

## 2014-09-17 DIAGNOSIS — I4891 Unspecified atrial fibrillation: Secondary | ICD-10-CM | POA: Diagnosis not present

## 2014-09-17 DIAGNOSIS — I10 Essential (primary) hypertension: Secondary | ICD-10-CM | POA: Diagnosis not present

## 2014-09-17 DIAGNOSIS — I619 Nontraumatic intracerebral hemorrhage, unspecified: Secondary | ICD-10-CM | POA: Diagnosis not present

## 2014-09-17 DIAGNOSIS — Z79899 Other long term (current) drug therapy: Secondary | ICD-10-CM | POA: Diagnosis not present

## 2014-09-17 DIAGNOSIS — E1149 Type 2 diabetes mellitus with other diabetic neurological complication: Secondary | ICD-10-CM | POA: Diagnosis not present

## 2014-09-17 DIAGNOSIS — E039 Hypothyroidism, unspecified: Secondary | ICD-10-CM | POA: Diagnosis not present

## 2014-09-17 DIAGNOSIS — M6281 Muscle weakness (generalized): Secondary | ICD-10-CM | POA: Diagnosis not present

## 2014-09-17 DIAGNOSIS — I6011 Nontraumatic subarachnoid hemorrhage from right middle cerebral artery: Secondary | ICD-10-CM | POA: Diagnosis not present

## 2014-09-19 DIAGNOSIS — M6281 Muscle weakness (generalized): Secondary | ICD-10-CM | POA: Diagnosis not present

## 2014-09-19 DIAGNOSIS — I6011 Nontraumatic subarachnoid hemorrhage from right middle cerebral artery: Secondary | ICD-10-CM | POA: Diagnosis not present

## 2014-09-20 DIAGNOSIS — E538 Deficiency of other specified B group vitamins: Secondary | ICD-10-CM | POA: Diagnosis not present

## 2014-09-20 DIAGNOSIS — E236 Other disorders of pituitary gland: Secondary | ICD-10-CM | POA: Diagnosis not present

## 2014-09-24 DIAGNOSIS — I6011 Nontraumatic subarachnoid hemorrhage from right middle cerebral artery: Secondary | ICD-10-CM | POA: Diagnosis not present

## 2014-09-24 DIAGNOSIS — M6281 Muscle weakness (generalized): Secondary | ICD-10-CM | POA: Diagnosis not present

## 2014-09-25 DIAGNOSIS — I610 Nontraumatic intracerebral hemorrhage in hemisphere, subcortical: Secondary | ICD-10-CM | POA: Diagnosis not present

## 2014-09-25 DIAGNOSIS — D320 Benign neoplasm of cerebral meninges: Secondary | ICD-10-CM | POA: Diagnosis not present

## 2014-09-25 DIAGNOSIS — D369 Benign neoplasm, unspecified site: Secondary | ICD-10-CM | POA: Diagnosis not present

## 2014-09-28 DIAGNOSIS — I619 Nontraumatic intracerebral hemorrhage, unspecified: Secondary | ICD-10-CM | POA: Diagnosis not present

## 2014-09-28 DIAGNOSIS — E1140 Type 2 diabetes mellitus with diabetic neuropathy, unspecified: Secondary | ICD-10-CM | POA: Diagnosis not present

## 2014-10-04 DIAGNOSIS — E291 Testicular hypofunction: Secondary | ICD-10-CM | POA: Diagnosis not present

## 2014-10-04 DIAGNOSIS — M6281 Muscle weakness (generalized): Secondary | ICD-10-CM | POA: Diagnosis not present

## 2014-10-04 DIAGNOSIS — I6011 Nontraumatic subarachnoid hemorrhage from right middle cerebral artery: Secondary | ICD-10-CM | POA: Diagnosis not present

## 2014-10-04 DIAGNOSIS — E538 Deficiency of other specified B group vitamins: Secondary | ICD-10-CM | POA: Diagnosis not present

## 2014-10-08 DIAGNOSIS — M6281 Muscle weakness (generalized): Secondary | ICD-10-CM | POA: Diagnosis not present

## 2014-10-08 DIAGNOSIS — I6011 Nontraumatic subarachnoid hemorrhage from right middle cerebral artery: Secondary | ICD-10-CM | POA: Diagnosis not present

## 2014-10-10 DIAGNOSIS — E291 Testicular hypofunction: Secondary | ICD-10-CM | POA: Diagnosis not present

## 2014-10-10 DIAGNOSIS — E559 Vitamin D deficiency, unspecified: Secondary | ICD-10-CM | POA: Diagnosis not present

## 2014-10-10 DIAGNOSIS — E039 Hypothyroidism, unspecified: Secondary | ICD-10-CM | POA: Diagnosis not present

## 2014-10-10 DIAGNOSIS — E1142 Type 2 diabetes mellitus with diabetic polyneuropathy: Secondary | ICD-10-CM | POA: Diagnosis not present

## 2014-10-10 DIAGNOSIS — Z5181 Encounter for therapeutic drug level monitoring: Secondary | ICD-10-CM | POA: Diagnosis not present

## 2014-10-10 DIAGNOSIS — E538 Deficiency of other specified B group vitamins: Secondary | ICD-10-CM | POA: Diagnosis not present

## 2014-10-15 DIAGNOSIS — I6011 Nontraumatic subarachnoid hemorrhage from right middle cerebral artery: Secondary | ICD-10-CM | POA: Diagnosis not present

## 2014-10-15 DIAGNOSIS — M6281 Muscle weakness (generalized): Secondary | ICD-10-CM | POA: Diagnosis not present

## 2014-10-16 DIAGNOSIS — R339 Retention of urine, unspecified: Secondary | ICD-10-CM | POA: Diagnosis not present

## 2014-10-17 ENCOUNTER — Encounter: Payer: Self-pay | Admitting: Podiatry

## 2014-10-17 ENCOUNTER — Ambulatory Visit (INDEPENDENT_AMBULATORY_CARE_PROVIDER_SITE_OTHER): Payer: Medicare Other | Admitting: Podiatry

## 2014-10-17 VITALS — BP 131/73 | HR 71 | Resp 18

## 2014-10-17 DIAGNOSIS — B351 Tinea unguium: Secondary | ICD-10-CM | POA: Diagnosis not present

## 2014-10-17 DIAGNOSIS — E1140 Type 2 diabetes mellitus with diabetic neuropathy, unspecified: Secondary | ICD-10-CM | POA: Diagnosis not present

## 2014-10-17 DIAGNOSIS — M79676 Pain in unspecified toe(s): Secondary | ICD-10-CM | POA: Diagnosis not present

## 2014-10-17 DIAGNOSIS — E236 Other disorders of pituitary gland: Secondary | ICD-10-CM | POA: Diagnosis not present

## 2014-10-17 DIAGNOSIS — E538 Deficiency of other specified B group vitamins: Secondary | ICD-10-CM | POA: Diagnosis not present

## 2014-10-17 NOTE — Progress Notes (Signed)
Patient ID: Alan Beltran, male   DOB: 01-24-1935, 80 y.o.   MRN: [id number] Complaint:  Visit Type: Patient returns to my office for continued preventative foot care services. Complaint: Patient states" my nails have grown long and thick and become painful to walk and wear shoes" Patient has been diagnosed with DM with no foot complications. The patient presents for preventative foot care services. No changes to ROS  Podiatric Exam: Vascular: dorsalis pedis and posterior tibial pulses are palpable bilateral. Capillary return is immediate. Temperature gradient is WNL. Skin turgor WNL  Sensorium: Diminished  Semmes Weinstein monofilament test. Normal tactile sensation bilaterally. Nail Exam: Pt has thick disfigured discolored nails with subungual debris noted bilateral entire nail hallux through fifth toenails Ulcer Exam: There is no evidence of ulcer or pre-ulcerative changes or infection. Orthopedic Exam: Muscle tone and strength are WNL. No limitations in general ROM. No crepitus or effusions noted. Foot type and digits show no abnormalities. Bony prominences are unremarkable. Skin: No Porokeratosis. No infection or ulcers  Diagnosis:  Onychomycosis, , Pain in right toe, pain in left toes  Treatment & Plan Procedures and Treatment: Consent by patient was obtained for treatment procedures. The patient understood the discussion of treatment and procedures well. All questions were answered thoroughly reviewed. Debridement of mycotic and hypertrophic toenails, 1 through 5 bilateral and clearing of subungual debris. No ulceration, no infection noted.  Return Visit-Office Procedure: Patient instructed to return to the office for a follow up visit 3 months for continued evaluation and treatment.

## 2014-10-22 DIAGNOSIS — H532 Diplopia: Secondary | ICD-10-CM | POA: Diagnosis not present

## 2014-10-24 DIAGNOSIS — E222 Syndrome of inappropriate secretion of antidiuretic hormone: Secondary | ICD-10-CM | POA: Diagnosis not present

## 2014-10-24 DIAGNOSIS — E871 Hypo-osmolality and hyponatremia: Secondary | ICD-10-CM | POA: Diagnosis not present

## 2014-10-30 DIAGNOSIS — I6011 Nontraumatic subarachnoid hemorrhage from right middle cerebral artery: Secondary | ICD-10-CM | POA: Diagnosis not present

## 2014-10-30 DIAGNOSIS — M6281 Muscle weakness (generalized): Secondary | ICD-10-CM | POA: Diagnosis not present

## 2014-10-31 DIAGNOSIS — E291 Testicular hypofunction: Secondary | ICD-10-CM | POA: Diagnosis not present

## 2014-10-31 DIAGNOSIS — E538 Deficiency of other specified B group vitamins: Secondary | ICD-10-CM | POA: Diagnosis not present

## 2014-10-31 DIAGNOSIS — Z23 Encounter for immunization: Secondary | ICD-10-CM | POA: Diagnosis not present

## 2014-11-05 DIAGNOSIS — R339 Retention of urine, unspecified: Secondary | ICD-10-CM | POA: Diagnosis not present

## 2014-11-13 DIAGNOSIS — E236 Other disorders of pituitary gland: Secondary | ICD-10-CM | POA: Diagnosis not present

## 2014-11-13 DIAGNOSIS — E538 Deficiency of other specified B group vitamins: Secondary | ICD-10-CM | POA: Diagnosis not present

## 2014-11-28 DIAGNOSIS — E538 Deficiency of other specified B group vitamins: Secondary | ICD-10-CM | POA: Diagnosis not present

## 2014-11-28 DIAGNOSIS — E236 Other disorders of pituitary gland: Secondary | ICD-10-CM | POA: Diagnosis not present

## 2014-12-04 DIAGNOSIS — J45909 Unspecified asthma, uncomplicated: Secondary | ICD-10-CM | POA: Diagnosis not present

## 2014-12-04 DIAGNOSIS — E871 Hypo-osmolality and hyponatremia: Secondary | ICD-10-CM | POA: Diagnosis not present

## 2014-12-05 DIAGNOSIS — L219 Seborrheic dermatitis, unspecified: Secondary | ICD-10-CM | POA: Diagnosis not present

## 2014-12-05 DIAGNOSIS — D225 Melanocytic nevi of trunk: Secondary | ICD-10-CM | POA: Diagnosis not present

## 2014-12-05 DIAGNOSIS — L821 Other seborrheic keratosis: Secondary | ICD-10-CM | POA: Diagnosis not present

## 2014-12-05 DIAGNOSIS — D485 Neoplasm of uncertain behavior of skin: Secondary | ICD-10-CM | POA: Diagnosis not present

## 2014-12-05 DIAGNOSIS — L578 Other skin changes due to chronic exposure to nonionizing radiation: Secondary | ICD-10-CM | POA: Diagnosis not present

## 2014-12-11 DIAGNOSIS — E236 Other disorders of pituitary gland: Secondary | ICD-10-CM | POA: Diagnosis not present

## 2014-12-11 DIAGNOSIS — E538 Deficiency of other specified B group vitamins: Secondary | ICD-10-CM | POA: Diagnosis not present

## 2014-12-14 DIAGNOSIS — D485 Neoplasm of uncertain behavior of skin: Secondary | ICD-10-CM | POA: Diagnosis not present

## 2014-12-25 DIAGNOSIS — E538 Deficiency of other specified B group vitamins: Secondary | ICD-10-CM | POA: Diagnosis not present

## 2014-12-25 DIAGNOSIS — E291 Testicular hypofunction: Secondary | ICD-10-CM | POA: Diagnosis not present

## 2014-12-27 DIAGNOSIS — E1140 Type 2 diabetes mellitus with diabetic neuropathy, unspecified: Secondary | ICD-10-CM | POA: Diagnosis not present

## 2014-12-27 DIAGNOSIS — I619 Nontraumatic intracerebral hemorrhage, unspecified: Secondary | ICD-10-CM | POA: Diagnosis not present

## 2015-01-02 DIAGNOSIS — I2510 Atherosclerotic heart disease of native coronary artery without angina pectoris: Secondary | ICD-10-CM | POA: Diagnosis not present

## 2015-01-02 DIAGNOSIS — I481 Persistent atrial fibrillation: Secondary | ICD-10-CM | POA: Diagnosis not present

## 2015-01-09 DIAGNOSIS — E039 Hypothyroidism, unspecified: Secondary | ICD-10-CM | POA: Diagnosis not present

## 2015-01-09 DIAGNOSIS — E291 Testicular hypofunction: Secondary | ICD-10-CM | POA: Diagnosis not present

## 2015-01-09 DIAGNOSIS — E559 Vitamin D deficiency, unspecified: Secondary | ICD-10-CM | POA: Diagnosis not present

## 2015-01-09 DIAGNOSIS — E538 Deficiency of other specified B group vitamins: Secondary | ICD-10-CM | POA: Diagnosis not present

## 2015-01-09 DIAGNOSIS — E1142 Type 2 diabetes mellitus with diabetic polyneuropathy: Secondary | ICD-10-CM | POA: Diagnosis not present

## 2015-01-09 DIAGNOSIS — Z5181 Encounter for therapeutic drug level monitoring: Secondary | ICD-10-CM | POA: Diagnosis not present

## 2015-01-10 DIAGNOSIS — G4731 Primary central sleep apnea: Secondary | ICD-10-CM | POA: Diagnosis not present

## 2015-01-10 DIAGNOSIS — M40204 Unspecified kyphosis, thoracic region: Secondary | ICD-10-CM | POA: Diagnosis not present

## 2015-01-10 DIAGNOSIS — R0602 Shortness of breath: Secondary | ICD-10-CM | POA: Diagnosis not present

## 2015-01-22 DIAGNOSIS — E538 Deficiency of other specified B group vitamins: Secondary | ICD-10-CM | POA: Diagnosis not present

## 2015-01-22 DIAGNOSIS — E236 Other disorders of pituitary gland: Secondary | ICD-10-CM | POA: Diagnosis not present

## 2015-02-04 ENCOUNTER — Encounter: Payer: Self-pay | Admitting: Podiatry

## 2015-02-04 ENCOUNTER — Ambulatory Visit (INDEPENDENT_AMBULATORY_CARE_PROVIDER_SITE_OTHER): Payer: Medicare Other | Admitting: Podiatry

## 2015-02-04 DIAGNOSIS — M79676 Pain in unspecified toe(s): Secondary | ICD-10-CM | POA: Diagnosis not present

## 2015-02-04 DIAGNOSIS — B351 Tinea unguium: Secondary | ICD-10-CM | POA: Diagnosis not present

## 2015-02-04 DIAGNOSIS — E1140 Type 2 diabetes mellitus with diabetic neuropathy, unspecified: Secondary | ICD-10-CM

## 2015-02-04 NOTE — Progress Notes (Signed)
Patient ID: Alan Beltran, male   DOB: 01/15/1935, 80 y.o.   MRN: [id number] Complaint:  Visit Type: Patient returns to my office for continued preventative foot care services. Complaint: Patient states" my nails have grown long and thick and become painful to walk and wear shoes" Patient has been diagnosed with DM with no foot complications. The patient presents for preventative foot care services. No changes to ROS  Podiatric Exam: Vascular: dorsalis pedis and posterior tibial pulses are palpable bilateral. Capillary return is immediate. Temperature gradient is WNL. Skin turgor WNL  Sensorium: Diminished  Semmes Weinstein monofilament test. Normal tactile sensation bilaterally. Nail Exam: Pt has thick disfigured discolored nails with subungual debris noted bilateral entire nail hallux through fifth toenails Ulcer Exam: There is no evidence of ulcer or pre-ulcerative changes or infection. Orthopedic Exam: Muscle tone and strength are WNL. No limitations in general ROM. No crepitus or effusions noted. Foot type and digits show no abnormalities. Bony prominences are unremarkable. Skin: No Porokeratosis. No infection or ulcers  Diagnosis:  Onychomycosis, , Pain in right toe, pain in left toes  Treatment & Plan Procedures and Treatment: Consent by patient was obtained for treatment procedures. The patient understood the discussion of treatment and procedures well. All questions were answered thoroughly reviewed. Debridement of mycotic and hypertrophic toenails, 1 through 5 bilateral and clearing of subungual debris. No ulceration, no infection noted.  Return Visit-Office Procedure: Patient instructed to return to the office for a follow up visit 3 months for continued evaluation and treatment. 

## 2015-02-05 DIAGNOSIS — E538 Deficiency of other specified B group vitamins: Secondary | ICD-10-CM | POA: Diagnosis not present

## 2015-02-05 DIAGNOSIS — E291 Testicular hypofunction: Secondary | ICD-10-CM | POA: Diagnosis not present

## 2015-02-06 DIAGNOSIS — R339 Retention of urine, unspecified: Secondary | ICD-10-CM | POA: Diagnosis not present

## 2015-02-19 DIAGNOSIS — E538 Deficiency of other specified B group vitamins: Secondary | ICD-10-CM | POA: Diagnosis not present

## 2015-02-19 DIAGNOSIS — E236 Other disorders of pituitary gland: Secondary | ICD-10-CM | POA: Diagnosis not present

## 2015-02-21 DIAGNOSIS — J45909 Unspecified asthma, uncomplicated: Secondary | ICD-10-CM | POA: Diagnosis not present

## 2015-02-21 DIAGNOSIS — G2581 Restless legs syndrome: Secondary | ICD-10-CM | POA: Diagnosis not present

## 2015-02-21 DIAGNOSIS — G4731 Primary central sleep apnea: Secondary | ICD-10-CM | POA: Diagnosis not present

## 2015-03-05 DIAGNOSIS — E538 Deficiency of other specified B group vitamins: Secondary | ICD-10-CM | POA: Diagnosis not present

## 2015-03-05 DIAGNOSIS — E236 Other disorders of pituitary gland: Secondary | ICD-10-CM | POA: Diagnosis not present

## 2015-03-19 DIAGNOSIS — E291 Testicular hypofunction: Secondary | ICD-10-CM | POA: Diagnosis not present

## 2015-03-19 DIAGNOSIS — E538 Deficiency of other specified B group vitamins: Secondary | ICD-10-CM | POA: Diagnosis not present

## 2015-04-02 DIAGNOSIS — E538 Deficiency of other specified B group vitamins: Secondary | ICD-10-CM | POA: Diagnosis not present

## 2015-04-02 DIAGNOSIS — E291 Testicular hypofunction: Secondary | ICD-10-CM | POA: Diagnosis not present

## 2015-04-04 DIAGNOSIS — I259 Chronic ischemic heart disease, unspecified: Secondary | ICD-10-CM | POA: Diagnosis not present

## 2015-04-04 DIAGNOSIS — Z0000 Encounter for general adult medical examination without abnormal findings: Secondary | ICD-10-CM | POA: Diagnosis not present

## 2015-04-04 DIAGNOSIS — E291 Testicular hypofunction: Secondary | ICD-10-CM | POA: Diagnosis not present

## 2015-04-04 DIAGNOSIS — E039 Hypothyroidism, unspecified: Secondary | ICD-10-CM | POA: Diagnosis not present

## 2015-04-04 DIAGNOSIS — I10 Essential (primary) hypertension: Secondary | ICD-10-CM | POA: Diagnosis not present

## 2015-04-08 DIAGNOSIS — L578 Other skin changes due to chronic exposure to nonionizing radiation: Secondary | ICD-10-CM | POA: Diagnosis not present

## 2015-04-08 DIAGNOSIS — D225 Melanocytic nevi of trunk: Secondary | ICD-10-CM | POA: Diagnosis not present

## 2015-04-08 DIAGNOSIS — D485 Neoplasm of uncertain behavior of skin: Secondary | ICD-10-CM | POA: Diagnosis not present

## 2015-04-16 DIAGNOSIS — E559 Vitamin D deficiency, unspecified: Secondary | ICD-10-CM | POA: Diagnosis not present

## 2015-04-16 DIAGNOSIS — E871 Hypo-osmolality and hyponatremia: Secondary | ICD-10-CM | POA: Diagnosis not present

## 2015-04-16 DIAGNOSIS — Z5181 Encounter for therapeutic drug level monitoring: Secondary | ICD-10-CM | POA: Diagnosis not present

## 2015-04-16 DIAGNOSIS — E538 Deficiency of other specified B group vitamins: Secondary | ICD-10-CM | POA: Diagnosis not present

## 2015-04-16 DIAGNOSIS — Z125 Encounter for screening for malignant neoplasm of prostate: Secondary | ICD-10-CM | POA: Diagnosis not present

## 2015-04-16 DIAGNOSIS — E1142 Type 2 diabetes mellitus with diabetic polyneuropathy: Secondary | ICD-10-CM | POA: Diagnosis not present

## 2015-04-16 DIAGNOSIS — E039 Hypothyroidism, unspecified: Secondary | ICD-10-CM | POA: Diagnosis not present

## 2015-04-16 DIAGNOSIS — E291 Testicular hypofunction: Secondary | ICD-10-CM | POA: Diagnosis not present

## 2015-04-24 DIAGNOSIS — E222 Syndrome of inappropriate secretion of antidiuretic hormone: Secondary | ICD-10-CM | POA: Diagnosis not present

## 2015-04-24 DIAGNOSIS — E871 Hypo-osmolality and hyponatremia: Secondary | ICD-10-CM | POA: Diagnosis not present

## 2015-04-29 DIAGNOSIS — R0602 Shortness of breath: Secondary | ICD-10-CM | POA: Diagnosis not present

## 2015-04-29 DIAGNOSIS — R05 Cough: Secondary | ICD-10-CM | POA: Diagnosis not present

## 2015-04-29 DIAGNOSIS — G4731 Primary central sleep apnea: Secondary | ICD-10-CM | POA: Diagnosis not present

## 2015-04-29 DIAGNOSIS — J45909 Unspecified asthma, uncomplicated: Secondary | ICD-10-CM | POA: Diagnosis not present

## 2015-04-30 DIAGNOSIS — E291 Testicular hypofunction: Secondary | ICD-10-CM | POA: Diagnosis not present

## 2015-04-30 DIAGNOSIS — E538 Deficiency of other specified B group vitamins: Secondary | ICD-10-CM | POA: Diagnosis not present

## 2015-05-06 ENCOUNTER — Encounter: Payer: Self-pay | Admitting: Podiatry

## 2015-05-06 ENCOUNTER — Ambulatory Visit (INDEPENDENT_AMBULATORY_CARE_PROVIDER_SITE_OTHER): Payer: Medicare Other | Admitting: Podiatry

## 2015-05-06 DIAGNOSIS — E1140 Type 2 diabetes mellitus with diabetic neuropathy, unspecified: Secondary | ICD-10-CM

## 2015-05-06 DIAGNOSIS — M79676 Pain in unspecified toe(s): Secondary | ICD-10-CM | POA: Diagnosis not present

## 2015-05-06 DIAGNOSIS — B351 Tinea unguium: Secondary | ICD-10-CM | POA: Diagnosis not present

## 2015-05-06 DIAGNOSIS — M1990 Unspecified osteoarthritis, unspecified site: Secondary | ICD-10-CM

## 2015-05-06 DIAGNOSIS — M129 Arthropathy, unspecified: Secondary | ICD-10-CM

## 2015-05-06 NOTE — Progress Notes (Signed)
Patient ID: Alan Beltran, male   DOB: January 11, 1935, 81 y.o.   MRN: [id number] Complaint:  Visit Type: Patient returns to my office for continued preventative foot care services. Complaint: Patient states" my nails have grown long and thick and become painful to walk and wear shoes" Patient has been diagnosed with DM with no foot complications. The patient presents for preventative foot care services. No changes to ROS  Podiatric Exam: Vascular: dorsalis pedis and posterior tibial pulses are palpable bilateral. Capillary return is immediate. Temperature gradient is WNL. Skin turgor WNL  Sensorium: Diminished  Semmes Weinstein monofilament test. Normal tactile sensation bilaterally. Nail Exam: Pt has thick disfigured discolored nails with subungual debris noted bilateral entire nail hallux through fifth toenails Ulcer Exam: There is no evidence of ulcer or pre-ulcerative changes or infection. Orthopedic Exam: Muscle tone and strength are WNL. No limitations in general ROM. No crepitus or effusions noted. Foot type and digits show no abnormalities. Bony prominences are unremarkable. Skin: No Porokeratosis. No infection or ulcers  Diagnosis:  Onychomycosis, , Pain in right toe, pain in left toes  Treatment & Plan Procedures and Treatment: Consent by patient was obtained for treatment procedures. The patient understood the discussion of treatment and procedures well. All questions were answered thoroughly reviewed. Debridement of mycotic and hypertrophic toenails, 1 through 5 bilateral and clearing of subungual debris. No ulceration, no infection noted. Initiate diabetic shoe paperwork for diabetic neuropathy and hallux limitus 1st MPJ B/L Return Visit-Office Procedure: Patient instructed to return to the office for a follow up visit 3 months for continued evaluation and treatment.   Gardiner Barefoot DPM   Gardiner Barefoot DPM

## 2015-05-07 DIAGNOSIS — E559 Vitamin D deficiency, unspecified: Secondary | ICD-10-CM | POA: Diagnosis not present

## 2015-05-07 DIAGNOSIS — E1142 Type 2 diabetes mellitus with diabetic polyneuropathy: Secondary | ICD-10-CM | POA: Diagnosis not present

## 2015-05-07 DIAGNOSIS — Z7984 Long term (current) use of oral hypoglycemic drugs: Secondary | ICD-10-CM | POA: Diagnosis not present

## 2015-05-07 DIAGNOSIS — Z125 Encounter for screening for malignant neoplasm of prostate: Secondary | ICD-10-CM | POA: Diagnosis not present

## 2015-05-07 DIAGNOSIS — E291 Testicular hypofunction: Secondary | ICD-10-CM | POA: Diagnosis not present

## 2015-05-07 DIAGNOSIS — E039 Hypothyroidism, unspecified: Secondary | ICD-10-CM | POA: Diagnosis not present

## 2015-05-07 DIAGNOSIS — E538 Deficiency of other specified B group vitamins: Secondary | ICD-10-CM | POA: Diagnosis not present

## 2015-05-14 DIAGNOSIS — E538 Deficiency of other specified B group vitamins: Secondary | ICD-10-CM | POA: Diagnosis not present

## 2015-05-14 DIAGNOSIS — E236 Other disorders of pituitary gland: Secondary | ICD-10-CM | POA: Diagnosis not present

## 2015-05-20 DIAGNOSIS — H35373 Puckering of macula, bilateral: Secondary | ICD-10-CM | POA: Diagnosis not present

## 2015-05-27 DIAGNOSIS — I4891 Unspecified atrial fibrillation: Secondary | ICD-10-CM | POA: Diagnosis not present

## 2015-05-28 DIAGNOSIS — E1142 Type 2 diabetes mellitus with diabetic polyneuropathy: Secondary | ICD-10-CM | POA: Diagnosis not present

## 2015-05-28 DIAGNOSIS — E039 Hypothyroidism, unspecified: Secondary | ICD-10-CM | POA: Diagnosis not present

## 2015-05-28 DIAGNOSIS — E538 Deficiency of other specified B group vitamins: Secondary | ICD-10-CM | POA: Diagnosis not present

## 2015-05-28 DIAGNOSIS — E559 Vitamin D deficiency, unspecified: Secondary | ICD-10-CM | POA: Diagnosis not present

## 2015-05-28 DIAGNOSIS — Z7984 Long term (current) use of oral hypoglycemic drugs: Secondary | ICD-10-CM | POA: Diagnosis not present

## 2015-05-28 DIAGNOSIS — E1165 Type 2 diabetes mellitus with hyperglycemia: Secondary | ICD-10-CM | POA: Diagnosis not present

## 2015-05-28 DIAGNOSIS — Z5181 Encounter for therapeutic drug level monitoring: Secondary | ICD-10-CM | POA: Diagnosis not present

## 2015-05-28 DIAGNOSIS — E291 Testicular hypofunction: Secondary | ICD-10-CM | POA: Diagnosis not present

## 2015-06-10 DIAGNOSIS — E291 Testicular hypofunction: Secondary | ICD-10-CM | POA: Diagnosis not present

## 2015-06-10 DIAGNOSIS — Z794 Long term (current) use of insulin: Secondary | ICD-10-CM | POA: Diagnosis not present

## 2015-06-10 DIAGNOSIS — E538 Deficiency of other specified B group vitamins: Secondary | ICD-10-CM | POA: Diagnosis not present

## 2015-06-10 DIAGNOSIS — E119 Type 2 diabetes mellitus without complications: Secondary | ICD-10-CM | POA: Diagnosis not present

## 2015-06-11 DIAGNOSIS — R339 Retention of urine, unspecified: Secondary | ICD-10-CM | POA: Diagnosis not present

## 2015-06-13 DIAGNOSIS — R200 Anesthesia of skin: Secondary | ICD-10-CM | POA: Diagnosis not present

## 2015-06-20 DIAGNOSIS — M5117 Intervertebral disc disorders with radiculopathy, lumbosacral region: Secondary | ICD-10-CM | POA: Diagnosis not present

## 2015-06-20 DIAGNOSIS — M4806 Spinal stenosis, lumbar region: Secondary | ICD-10-CM | POA: Diagnosis not present

## 2015-06-20 DIAGNOSIS — M5116 Intervertebral disc disorders with radiculopathy, lumbar region: Secondary | ICD-10-CM | POA: Diagnosis not present

## 2015-06-25 DIAGNOSIS — E538 Deficiency of other specified B group vitamins: Secondary | ICD-10-CM | POA: Diagnosis not present

## 2015-06-25 DIAGNOSIS — E291 Testicular hypofunction: Secondary | ICD-10-CM | POA: Diagnosis not present

## 2015-07-09 DIAGNOSIS — E291 Testicular hypofunction: Secondary | ICD-10-CM | POA: Diagnosis not present

## 2015-07-09 DIAGNOSIS — E538 Deficiency of other specified B group vitamins: Secondary | ICD-10-CM | POA: Diagnosis not present

## 2015-07-11 DIAGNOSIS — R790 Abnormal level of blood mineral: Secondary | ICD-10-CM | POA: Diagnosis not present

## 2015-07-11 DIAGNOSIS — D649 Anemia, unspecified: Secondary | ICD-10-CM | POA: Diagnosis not present

## 2015-07-11 DIAGNOSIS — E119 Type 2 diabetes mellitus without complications: Secondary | ICD-10-CM | POA: Diagnosis not present

## 2015-07-11 DIAGNOSIS — I10 Essential (primary) hypertension: Secondary | ICD-10-CM | POA: Diagnosis not present

## 2015-07-11 DIAGNOSIS — Z79899 Other long term (current) drug therapy: Secondary | ICD-10-CM | POA: Diagnosis not present

## 2015-07-11 DIAGNOSIS — E785 Hyperlipidemia, unspecified: Secondary | ICD-10-CM | POA: Diagnosis not present

## 2015-07-11 DIAGNOSIS — I259 Chronic ischemic heart disease, unspecified: Secondary | ICD-10-CM | POA: Diagnosis not present

## 2015-07-11 DIAGNOSIS — R5383 Other fatigue: Secondary | ICD-10-CM | POA: Diagnosis not present

## 2015-07-16 DIAGNOSIS — I5032 Chronic diastolic (congestive) heart failure: Secondary | ICD-10-CM | POA: Diagnosis not present

## 2015-07-16 DIAGNOSIS — E785 Hyperlipidemia, unspecified: Secondary | ICD-10-CM | POA: Diagnosis not present

## 2015-07-16 DIAGNOSIS — I481 Persistent atrial fibrillation: Secondary | ICD-10-CM | POA: Diagnosis not present

## 2015-07-16 DIAGNOSIS — I2510 Atherosclerotic heart disease of native coronary artery without angina pectoris: Secondary | ICD-10-CM | POA: Diagnosis not present

## 2015-07-17 DIAGNOSIS — M4806 Spinal stenosis, lumbar region: Secondary | ICD-10-CM | POA: Diagnosis not present

## 2015-07-17 DIAGNOSIS — M5416 Radiculopathy, lumbar region: Secondary | ICD-10-CM | POA: Diagnosis not present

## 2015-07-23 DIAGNOSIS — E291 Testicular hypofunction: Secondary | ICD-10-CM | POA: Diagnosis not present

## 2015-07-23 DIAGNOSIS — E538 Deficiency of other specified B group vitamins: Secondary | ICD-10-CM | POA: Diagnosis not present

## 2015-08-05 ENCOUNTER — Ambulatory Visit: Payer: Medicare Other | Admitting: Podiatry

## 2015-08-06 DIAGNOSIS — E291 Testicular hypofunction: Secondary | ICD-10-CM | POA: Diagnosis not present

## 2015-08-06 DIAGNOSIS — Z79899 Other long term (current) drug therapy: Secondary | ICD-10-CM | POA: Diagnosis not present

## 2015-08-06 DIAGNOSIS — E1142 Type 2 diabetes mellitus with diabetic polyneuropathy: Secondary | ICD-10-CM | POA: Diagnosis not present

## 2015-08-06 DIAGNOSIS — E559 Vitamin D deficiency, unspecified: Secondary | ICD-10-CM | POA: Diagnosis not present

## 2015-08-06 DIAGNOSIS — E538 Deficiency of other specified B group vitamins: Secondary | ICD-10-CM | POA: Diagnosis not present

## 2015-08-06 DIAGNOSIS — Z7984 Long term (current) use of oral hypoglycemic drugs: Secondary | ICD-10-CM | POA: Diagnosis not present

## 2015-08-06 DIAGNOSIS — E039 Hypothyroidism, unspecified: Secondary | ICD-10-CM | POA: Diagnosis not present

## 2015-08-06 DIAGNOSIS — Z5181 Encounter for therapeutic drug level monitoring: Secondary | ICD-10-CM | POA: Diagnosis not present

## 2015-08-06 DIAGNOSIS — E1165 Type 2 diabetes mellitus with hyperglycemia: Secondary | ICD-10-CM | POA: Diagnosis not present

## 2015-08-07 ENCOUNTER — Ambulatory Visit (INDEPENDENT_AMBULATORY_CARE_PROVIDER_SITE_OTHER): Payer: Medicare Other | Admitting: Sports Medicine

## 2015-08-07 ENCOUNTER — Encounter: Payer: Self-pay | Admitting: Sports Medicine

## 2015-08-07 ENCOUNTER — Ambulatory Visit: Payer: Medicare Other

## 2015-08-07 DIAGNOSIS — M79676 Pain in unspecified toe(s): Secondary | ICD-10-CM | POA: Diagnosis not present

## 2015-08-07 DIAGNOSIS — E1140 Type 2 diabetes mellitus with diabetic neuropathy, unspecified: Secondary | ICD-10-CM | POA: Diagnosis not present

## 2015-08-07 DIAGNOSIS — B351 Tinea unguium: Secondary | ICD-10-CM | POA: Diagnosis not present

## 2015-08-07 DIAGNOSIS — M2040 Other hammer toe(s) (acquired), unspecified foot: Secondary | ICD-10-CM

## 2015-08-07 NOTE — Progress Notes (Signed)
Patient ID: Alan Beltran, male   DOB: March 04, 1934, 81 y.o.   MRN: [id number] Subjective: Alan Beltran is a 81 y.o. male patient with history of diabetes who presents to office today for measurement for diabetic shoes and complaining of long, painful nails  while ambulating in shoes; unable to trim. Patient states that the glucose reading this morning was 140 mg/dl. Patient denies any new changes in medication or new problems. Patient denies any new cramping, numbness, burning or tingling in the legs. Admits to baseline numbness, tingling and burning, Unchanged from prior.  Patient Active Problem List   Diagnosis Date Noted  . Diabetes mellitus 09/04/2010   Current Outpatient Prescriptions on File Prior to Visit  Medication Sig Dispense Refill  . ALLOPURINOL PO Take by mouth.      . FOLIC ACID PO Take by mouth.      . Levothyroxine Sodium (SYNTHROID PO) Take by mouth.      . OMEPRAZOLE PO Take by mouth.      . Pioglitazone HCl (ACTOS PO) Take by mouth.      . Rosuvastatin Calcium (CRESTOR PO) Take by mouth.      . SERTRALINE HCL PO Take by mouth.      . SPIRONOLACTONE PO Take by mouth.      . TERAZOSIN HCL PO Take by mouth.      . TRUEPLUS LANCETS 33G MISC   5  . TRUETRACK TEST test strip   4  . WARFARIN SODIUM PO Take by mouth.      . Zinc Sulfate (ZINC 15 PO) Take by mouth.       No current facility-administered medications on file prior to visit.   No Known Allergies  No results found for this or any previous visit (from the past 2160 hour(s)).  Objective: General: Patient is awake, alert, and oriented x 3 and in no acute distress.  Integument: Skin is warm, dry and supple bilateral. Nails are tender, long, thickened and  dystrophic with subungual debris, consistent with onychomycosis, 1-5 bilateral. No signs of infection. No open lesions or preulcerative lesions present bilateral. Remaining integument unremarkable.  Vasculature:  Dorsalis Pedis pulse 1/4 bilateral. Posterior Tibial  pulse  1/4 bilateral.  Capillary fill time <3 sec 1-5 bilateral. Diminished hair growth to the level of the digits. Temperature gradient within normal limits. No varicosities present bilateral. No edema present bilateral.   Neurology: The patient has intact sensation measured with a 5.07/10g Sem[employer] Monofilament at all pedal sites bilateral . Vibratory sensation diminished bilateral with tuning fork. No Babinski sign present bilateral.   Musculoskeletal: Asymptomatic hammertoe pedal deformities noted bilateral. Muscular strength 5/5 in all lower extremity muscular groups bilateral without pain on range of motion . No tenderness with calf compression bilateral.  Assessment and Plan: Problem List Items Addressed This Visit    None    Visit Diagnoses    Pain due to onychomycosis of toenail    -  Primary    Type 2 diabetes, controlled, with neuropathy ([place])        Hammertoe, unspecified laterality          -Examined patient. -Discussed and educated patient on diabetic foot care, especially with  regards to the vascular, neurological and musculoskeletal systems.  -Stressed the importance of good glycemic control and the detriment of not  controlling glucose levels in relation to the foot. -Mechanically debrided all nails 1-5 bilateral using sterile nail nipper and filed with dremel without incident  -Patient met with  Betha and was measured for diabetic shoes and picked out style of shoes office to contact patient when she was to arrive for pickup and dispensing -Answered all patient questions -Patient to return  in 3 months for at risk foot care/nail care with me  -Patient advised to call the office if any problems or questions arise in the meantime.  Landis Martins, DPM

## 2015-08-20 DIAGNOSIS — E538 Deficiency of other specified B group vitamins: Secondary | ICD-10-CM | POA: Diagnosis not present

## 2015-08-20 DIAGNOSIS — E236 Other disorders of pituitary gland: Secondary | ICD-10-CM | POA: Diagnosis not present

## 2015-08-29 DIAGNOSIS — G4731 Primary central sleep apnea: Secondary | ICD-10-CM | POA: Diagnosis not present

## 2015-08-29 DIAGNOSIS — J45909 Unspecified asthma, uncomplicated: Secondary | ICD-10-CM | POA: Diagnosis not present

## 2015-09-03 DIAGNOSIS — E291 Testicular hypofunction: Secondary | ICD-10-CM | POA: Diagnosis not present

## 2015-09-03 DIAGNOSIS — E538 Deficiency of other specified B group vitamins: Secondary | ICD-10-CM | POA: Diagnosis not present

## 2015-09-16 DIAGNOSIS — E538 Deficiency of other specified B group vitamins: Secondary | ICD-10-CM | POA: Diagnosis not present

## 2015-09-16 DIAGNOSIS — E291 Testicular hypofunction: Secondary | ICD-10-CM | POA: Diagnosis not present

## 2015-10-01 DIAGNOSIS — E538 Deficiency of other specified B group vitamins: Secondary | ICD-10-CM | POA: Diagnosis not present

## 2015-10-01 DIAGNOSIS — E039 Hypothyroidism, unspecified: Secondary | ICD-10-CM | POA: Diagnosis not present

## 2015-10-09 ENCOUNTER — Ambulatory Visit (INDEPENDENT_AMBULATORY_CARE_PROVIDER_SITE_OTHER): Payer: Medicare Other | Admitting: Podiatry

## 2015-10-09 DIAGNOSIS — Q828 Other specified congenital malformations of skin: Secondary | ICD-10-CM

## 2015-10-09 DIAGNOSIS — M2040 Other hammer toe(s) (acquired), unspecified foot: Secondary | ICD-10-CM | POA: Diagnosis not present

## 2015-10-09 DIAGNOSIS — E1140 Type 2 diabetes mellitus with diabetic neuropathy, unspecified: Secondary | ICD-10-CM | POA: Diagnosis not present

## 2015-10-09 DIAGNOSIS — I8310 Varicose veins of unspecified lower extremity with inflammation: Secondary | ICD-10-CM | POA: Diagnosis not present

## 2015-10-09 DIAGNOSIS — R600 Localized edema: Secondary | ICD-10-CM | POA: Diagnosis not present

## 2015-10-09 DIAGNOSIS — D225 Melanocytic nevi of trunk: Secondary | ICD-10-CM | POA: Diagnosis not present

## 2015-10-09 DIAGNOSIS — D485 Neoplasm of uncertain behavior of skin: Secondary | ICD-10-CM | POA: Diagnosis not present

## 2015-10-09 DIAGNOSIS — M1990 Unspecified osteoarthritis, unspecified site: Secondary | ICD-10-CM

## 2015-10-17 DIAGNOSIS — E291 Testicular hypofunction: Secondary | ICD-10-CM | POA: Diagnosis not present

## 2015-10-17 DIAGNOSIS — Z7984 Long term (current) use of oral hypoglycemic drugs: Secondary | ICD-10-CM | POA: Diagnosis not present

## 2015-10-17 DIAGNOSIS — E538 Deficiency of other specified B group vitamins: Secondary | ICD-10-CM | POA: Diagnosis not present

## 2015-10-17 DIAGNOSIS — Z23 Encounter for immunization: Secondary | ICD-10-CM | POA: Diagnosis not present

## 2015-10-17 DIAGNOSIS — E039 Hypothyroidism, unspecified: Secondary | ICD-10-CM | POA: Diagnosis not present

## 2015-10-17 DIAGNOSIS — E559 Vitamin D deficiency, unspecified: Secondary | ICD-10-CM | POA: Diagnosis not present

## 2015-10-17 DIAGNOSIS — R197 Diarrhea, unspecified: Secondary | ICD-10-CM | POA: Diagnosis not present

## 2015-10-17 DIAGNOSIS — E1142 Type 2 diabetes mellitus with diabetic polyneuropathy: Secondary | ICD-10-CM | POA: Diagnosis not present

## 2015-10-17 DIAGNOSIS — Z5181 Encounter for therapeutic drug level monitoring: Secondary | ICD-10-CM | POA: Diagnosis not present

## 2015-10-29 DIAGNOSIS — E538 Deficiency of other specified B group vitamins: Secondary | ICD-10-CM | POA: Diagnosis not present

## 2015-10-29 DIAGNOSIS — E291 Testicular hypofunction: Secondary | ICD-10-CM | POA: Diagnosis not present

## 2015-10-30 NOTE — Progress Notes (Signed)
Patient ID: Alan Beltran, male   DOB: 1935-01-20, 81 y.o.   MRN: [id number] Complaint:  Visit Type: Patient returns to my office for pick up of his diabetic shoes.  Podiatric Exam: Vascular: dorsalis pedis and posterior tibial pulses are palpable bilateral. Capillary return is immediate. Temperature gradient is WNL. Skin turgor WNL  Sensorium: Diminished  Semmes Weinstein monofilament test. Normal tactile sensation bilaterally. Nail Exam: Pt has thick disfigured discolored nails with subungual debris noted bilateral entire nail hallux through fifth toenails Ulcer Exam: There is no evidence of ulcer or pre-ulcerative changes or infection. Orthopedic Exam: Muscle tone and strength are WNL. No limitations in general ROM. No crepitus or effusions noted. Foot type and digits show no abnormalities. Bony prominences are unremarkable. Hallux limitus 1st MPJ  B/L. Skin: No Porokeratosis. No infection or ulcers  Diagnosis:  Diabetic neuropathy  Hallux limitus 1st MPJ  B/L  Treatment & Plan Procedures and Treatment: Consent by patient was obtained for treatment procedures. Diabetic shoes were dispensed.  Patient presents today and was dispensed 0ne pair ( two units) of medically necessary extra depth shoes with three pair( six units) of custom molded multiple density inserts. The shoes and the inserts are fitted to the patients ' feet and are noted to fit well and are free of defect.  Length and width of the shoes are also acceptable.  Patient was given written and verbal  instructions for wearing.  If any concerns arrive with the shoes or inserts, the patient is to call the office.Patient is to follow up with doctor in six weeks. Return Visit-Office Procedure: Patient instructed to return to the office for a follow up visit 3 months for continued evaluation and treatment.   Gardiner Barefoot DPM   Gardiner Barefoot DPMPatient presents for diabetic shoe pick up, shoes are tried on for good fit.  Patient received 1  Pair and 3 pairs custom molded diabetic inserts.  Verbal and written break in and wear instructions given.  Patient will follow up for scheduled routine care.

## 2015-11-06 ENCOUNTER — Ambulatory Visit: Payer: Medicare Other | Admitting: Sports Medicine

## 2015-11-11 DIAGNOSIS — E291 Testicular hypofunction: Secondary | ICD-10-CM | POA: Diagnosis not present

## 2015-11-11 DIAGNOSIS — E538 Deficiency of other specified B group vitamins: Secondary | ICD-10-CM | POA: Diagnosis not present

## 2015-11-20 ENCOUNTER — Ambulatory Visit (INDEPENDENT_AMBULATORY_CARE_PROVIDER_SITE_OTHER): Payer: Medicare Other | Admitting: Sports Medicine

## 2015-11-20 ENCOUNTER — Encounter: Payer: Self-pay | Admitting: Sports Medicine

## 2015-11-20 DIAGNOSIS — M1990 Unspecified osteoarthritis, unspecified site: Secondary | ICD-10-CM

## 2015-11-20 DIAGNOSIS — E1140 Type 2 diabetes mellitus with diabetic neuropathy, unspecified: Secondary | ICD-10-CM

## 2015-11-20 DIAGNOSIS — M79676 Pain in unspecified toe(s): Secondary | ICD-10-CM

## 2015-11-20 DIAGNOSIS — B351 Tinea unguium: Secondary | ICD-10-CM | POA: Diagnosis not present

## 2015-11-20 NOTE — Progress Notes (Signed)
Patient ID: Alan Beltran, male   DOB: June 28, 1934, 81 y.o.   MRN: [id number] Subjective: Alan Beltran is a 81 y.o. male patient with history of diabetes who returns to office today complaining of long, painful nails  while ambulating in shoes; unable to trim. Patient states that the glucose reading yesterday was 127 mg/dl. Patient denies any new changes in medication or new problems. Patient denies any new cramping, numbness, burning or tingling in the legs. Admits to baseline numbness, tingling and burning, Unchanged from prior.  Patient Active Problem List   Diagnosis Date Noted  . Diabetes mellitus 09/04/2010   Current Outpatient Prescriptions on File Prior to Visit  Medication Sig Dispense Refill  . ALLOPURINOL PO Take by mouth.      . FOLIC ACID PO Take by mouth.      . Levothyroxine Sodium (SYNTHROID PO) Take by mouth.      . OMEPRAZOLE PO Take by mouth.      . Pioglitazone HCl (ACTOS PO) Take by mouth.      . Rosuvastatin Calcium (CRESTOR PO) Take by mouth.      . SERTRALINE HCL PO Take by mouth.      . SPIRONOLACTONE PO Take by mouth.      . TERAZOSIN HCL PO Take by mouth.      . TRUEPLUS LANCETS 33G MISC   5  . TRUETRACK TEST test strip   4  . WARFARIN SODIUM PO Take by mouth.      . Zinc Sulfate (ZINC 15 PO) Take by mouth.       No current facility-administered medications on file prior to visit.    No Known Allergies  No results found for this or any previous visit (from the past 2160 hour(s)).  Objective: General: Patient is awake, alert, and oriented x 3 and in no acute distress.  Integument: Skin is warm, dry and supple bilateral. Nails are tender, long, thickened and dystrophic with subungual debris, consistent with onychomycosis, 1-5 bilateral. No signs of infection. No open lesions or preulcerative lesions present bilateral. Remaining integument unremarkable.  Vasculature:  Dorsalis Pedis pulse 1/4 bilateral. Posterior Tibial pulse  1/4 bilateral. Capillary fill time <3  sec 1-5 bilateral. Diminished hair growth to the level of the digits.Temperature gradient within normal limits. No varicosities present bilateral. No edema present bilateral.   Neurology: The patient has intact sensation measured with a 5.07/10g [employer] Monofilament at all pedal sites bilateral . Vibratory sensation diminished bilateral with tuning fork. No Babinski sign present bilateral.   Musculoskeletal: Asymptomatic hammertoe pedal deformities noted bilateral. Muscular strength 5/5 in all lower extremity muscular groups bilateral without pain on range of motion . No tenderness with calf compression bilateral.  Assessment and Plan: Problem List Items Addressed This Visit    None    Visit Diagnoses    Pain due to onychomycosis of toenail    -  Primary   Type 2 diabetes, controlled, with neuropathy ([facility])       Chronic arthritis         -Examined patient. -Discussed and educated patient on diabetic foot care, especially with  regards to the vascular, neurological and musculoskeletal systems.  -Stressed the importance of good glycemic control and the detriment of not  controlling glucose levels in relation to the foot. -Mechanically debrided all nails 1-5 bilateral using sterile nail nipper and filed with dremel without incident  -Continue with Diabetic shoes -Answered all patient questions -Patient to return  in 3 months for  at risk foot care/nail care -Patient advised to call the office if any problems or questions arise in the meantime.  Landis Martins, DPM

## 2015-11-26 DIAGNOSIS — E291 Testicular hypofunction: Secondary | ICD-10-CM | POA: Diagnosis not present

## 2015-11-26 DIAGNOSIS — E538 Deficiency of other specified B group vitamins: Secondary | ICD-10-CM | POA: Diagnosis not present

## 2015-12-05 DIAGNOSIS — G629 Polyneuropathy, unspecified: Secondary | ICD-10-CM | POA: Diagnosis not present

## 2015-12-05 DIAGNOSIS — I639 Cerebral infarction, unspecified: Secondary | ICD-10-CM | POA: Diagnosis not present

## 2015-12-10 DIAGNOSIS — E039 Hypothyroidism, unspecified: Secondary | ICD-10-CM | POA: Diagnosis not present

## 2015-12-10 DIAGNOSIS — E538 Deficiency of other specified B group vitamins: Secondary | ICD-10-CM | POA: Diagnosis not present

## 2015-12-17 DIAGNOSIS — R339 Retention of urine, unspecified: Secondary | ICD-10-CM | POA: Diagnosis not present

## 2015-12-24 DIAGNOSIS — E291 Testicular hypofunction: Secondary | ICD-10-CM | POA: Diagnosis not present

## 2015-12-24 DIAGNOSIS — E538 Deficiency of other specified B group vitamins: Secondary | ICD-10-CM | POA: Diagnosis not present

## 2016-01-07 DIAGNOSIS — E291 Testicular hypofunction: Secondary | ICD-10-CM | POA: Diagnosis not present

## 2016-01-07 DIAGNOSIS — E538 Deficiency of other specified B group vitamins: Secondary | ICD-10-CM | POA: Diagnosis not present

## 2016-01-23 DIAGNOSIS — E236 Other disorders of pituitary gland: Secondary | ICD-10-CM | POA: Diagnosis not present

## 2016-01-23 DIAGNOSIS — E538 Deficiency of other specified B group vitamins: Secondary | ICD-10-CM | POA: Diagnosis not present

## 2016-01-29 DIAGNOSIS — J45909 Unspecified asthma, uncomplicated: Secondary | ICD-10-CM | POA: Diagnosis not present

## 2016-01-29 DIAGNOSIS — J45901 Unspecified asthma with (acute) exacerbation: Secondary | ICD-10-CM | POA: Diagnosis not present

## 2016-01-29 DIAGNOSIS — R05 Cough: Secondary | ICD-10-CM | POA: Diagnosis not present

## 2016-01-29 DIAGNOSIS — I700 Atherosclerosis of aorta: Secondary | ICD-10-CM | POA: Diagnosis not present

## 2016-01-29 DIAGNOSIS — I5032 Chronic diastolic (congestive) heart failure: Secondary | ICD-10-CM | POA: Diagnosis not present

## 2016-01-29 DIAGNOSIS — I517 Cardiomegaly: Secondary | ICD-10-CM | POA: Diagnosis not present

## 2016-01-29 DIAGNOSIS — R0602 Shortness of breath: Secondary | ICD-10-CM | POA: Diagnosis not present

## 2016-02-04 DIAGNOSIS — E538 Deficiency of other specified B group vitamins: Secondary | ICD-10-CM | POA: Diagnosis not present

## 2016-02-04 DIAGNOSIS — E236 Other disorders of pituitary gland: Secondary | ICD-10-CM | POA: Diagnosis not present

## 2016-02-05 DIAGNOSIS — E871 Hypo-osmolality and hyponatremia: Secondary | ICD-10-CM | POA: Diagnosis not present

## 2016-02-05 DIAGNOSIS — R600 Localized edema: Secondary | ICD-10-CM | POA: Diagnosis not present

## 2016-02-17 DIAGNOSIS — H35373 Puckering of macula, bilateral: Secondary | ICD-10-CM | POA: Diagnosis not present

## 2016-02-18 DIAGNOSIS — E236 Other disorders of pituitary gland: Secondary | ICD-10-CM | POA: Diagnosis not present

## 2016-02-18 DIAGNOSIS — E538 Deficiency of other specified B group vitamins: Secondary | ICD-10-CM | POA: Diagnosis not present

## 2016-02-21 ENCOUNTER — Ambulatory Visit (INDEPENDENT_AMBULATORY_CARE_PROVIDER_SITE_OTHER): Payer: Medicare Other | Admitting: Sports Medicine

## 2016-02-21 ENCOUNTER — Encounter: Payer: Self-pay | Admitting: Sports Medicine

## 2016-02-21 DIAGNOSIS — B351 Tinea unguium: Secondary | ICD-10-CM | POA: Diagnosis not present

## 2016-02-21 DIAGNOSIS — M79676 Pain in unspecified toe(s): Secondary | ICD-10-CM | POA: Diagnosis not present

## 2016-02-21 DIAGNOSIS — E1140 Type 2 diabetes mellitus with diabetic neuropathy, unspecified: Secondary | ICD-10-CM

## 2016-02-21 NOTE — Progress Notes (Signed)
Patient ID: Alan Beltran, male   DOB: [birth year], 82 y.o.   MRN: [id number] Subjective: Alan Beltran is a 82 y.o. male patient with history of diabetes who returns to office today complaining of long, painful nails  while ambulating in shoes; unable to trim. Patient states that the glucose reading was "good". Patient denies any new changes in medication or new problems. Patient denies any new cramping, numbness, burning or tingling in the legs. Admits to baseline numbness, tingling and burning, Unchanged from prior.  Patient Active Problem List   Diagnosis Date Noted  . Diabetes mellitus 09/04/2010   Current Outpatient Prescriptions on File Prior to Visit  Medication Sig Dispense Refill  . ALLOPURINOL PO Take by mouth.      . FOLIC ACID PO Take by mouth.      . Levothyroxine Sodium (SYNTHROID PO) Take by mouth.      . OMEPRAZOLE PO Take by mouth.      . Pioglitazone HCl (ACTOS PO) Take by mouth.      . Rosuvastatin Calcium (CRESTOR PO) Take by mouth.      . SERTRALINE HCL PO Take by mouth.      . SPIRONOLACTONE PO Take by mouth.      . TERAZOSIN HCL PO Take by mouth.      . TRUEPLUS LANCETS 33G MISC   5  . TRUETRACK TEST test strip   4  . WARFARIN SODIUM PO Take by mouth.      . Zinc Sulfate (ZINC 15 PO) Take by mouth.       No current facility-administered medications on file prior to visit.    No Known Allergies  No results found for this or any previous visit (from the past 2160 hour(s)).  Objective: General: Patient is awake, alert, and oriented x 3 and in no acute distress.  Integument: Skin is warm, dry and supple bilateral. Nails are tender, long, thickened and dystrophic with subungual debris, consistent with onychomycosis, 1-5 bilateral. No signs of infection. No open lesions or preulcerative lesions present bilateral. Remaining integument unremarkable.  Vasculature:  Dorsalis Pedis pulse 1/4 bilateral. Posterior Tibial pulse  1/4 bilateral. Capillary fill time <3 sec 1-5  bilateral. Diminished hair growth to the level of the digits.Temperature gradient within normal limits. No varicosities present bilateral. No edema present bilateral.   Neurology: The patient has intact sensation measured with a 5.07/10g [employer] Monofilament at all pedal sites bilateral . Vibratory sensation diminished bilateral with tuning fork. No Babinski sign present bilateral.   Musculoskeletal: Asymptomatic hammertoe pedal deformities noted bilateral. Muscular strength 5/5 in all lower extremity muscular groups bilateral without pain on range of motion . No tenderness with calf compression bilateral.  Assessment and Plan: Problem List Items Addressed This Visit    None    Visit Diagnoses    Pain due to onychomycosis of toenail    -  Primary   Type 2 diabetes, controlled, with neuropathy ([place])         -Examined patient. -Discussed and educated patient on diabetic foot care, especially with  regards to the vascular, neurological and musculoskeletal systems.  -Stressed the importance of good glycemic control and the detriment of not  controlling glucose levels in relation to the foot. -Mechanically debrided all nails 1-5 bilateral using sterile nail nipper and filed with dremel without incident  -Continue with Diabetic shoes -Answered all patient questions -Patient to return  in 3 months for at risk foot care/nail care -Patient advised to call the  office if any problems or questions arise in the meantime.  Landis Martins, DPM

## 2016-02-27 DIAGNOSIS — M109 Gout, unspecified: Secondary | ICD-10-CM | POA: Diagnosis not present

## 2016-02-27 DIAGNOSIS — E119 Type 2 diabetes mellitus without complications: Secondary | ICD-10-CM | POA: Diagnosis not present

## 2016-02-27 DIAGNOSIS — Z79899 Other long term (current) drug therapy: Secondary | ICD-10-CM | POA: Diagnosis not present

## 2016-02-27 DIAGNOSIS — E039 Hypothyroidism, unspecified: Secondary | ICD-10-CM | POA: Diagnosis not present

## 2016-02-27 DIAGNOSIS — I10 Essential (primary) hypertension: Secondary | ICD-10-CM | POA: Diagnosis not present

## 2016-02-27 DIAGNOSIS — E785 Hyperlipidemia, unspecified: Secondary | ICD-10-CM | POA: Diagnosis not present

## 2016-02-27 DIAGNOSIS — K219 Gastro-esophageal reflux disease without esophagitis: Secondary | ICD-10-CM | POA: Diagnosis not present

## 2016-03-03 DIAGNOSIS — E538 Deficiency of other specified B group vitamins: Secondary | ICD-10-CM | POA: Diagnosis not present

## 2016-03-03 DIAGNOSIS — E871 Hypo-osmolality and hyponatremia: Secondary | ICD-10-CM | POA: Diagnosis not present

## 2016-03-03 DIAGNOSIS — E291 Testicular hypofunction: Secondary | ICD-10-CM | POA: Diagnosis not present

## 2016-03-17 DIAGNOSIS — E538 Deficiency of other specified B group vitamins: Secondary | ICD-10-CM | POA: Diagnosis not present

## 2016-03-17 DIAGNOSIS — E291 Testicular hypofunction: Secondary | ICD-10-CM | POA: Diagnosis not present

## 2016-03-26 DIAGNOSIS — M25511 Pain in right shoulder: Secondary | ICD-10-CM | POA: Diagnosis not present

## 2016-03-26 DIAGNOSIS — J45901 Unspecified asthma with (acute) exacerbation: Secondary | ICD-10-CM | POA: Diagnosis not present

## 2016-03-26 DIAGNOSIS — M1990 Unspecified osteoarthritis, unspecified site: Secondary | ICD-10-CM | POA: Diagnosis not present

## 2016-03-26 DIAGNOSIS — I5032 Chronic diastolic (congestive) heart failure: Secondary | ICD-10-CM | POA: Diagnosis not present

## 2016-03-26 DIAGNOSIS — M25512 Pain in left shoulder: Secondary | ICD-10-CM | POA: Diagnosis not present

## 2016-03-31 DIAGNOSIS — E538 Deficiency of other specified B group vitamins: Secondary | ICD-10-CM | POA: Diagnosis not present

## 2016-03-31 DIAGNOSIS — E236 Other disorders of pituitary gland: Secondary | ICD-10-CM | POA: Diagnosis not present

## 2016-04-02 DIAGNOSIS — E222 Syndrome of inappropriate secretion of antidiuretic hormone: Secondary | ICD-10-CM | POA: Diagnosis not present

## 2016-04-02 DIAGNOSIS — G629 Polyneuropathy, unspecified: Secondary | ICD-10-CM | POA: Diagnosis not present

## 2016-04-14 DIAGNOSIS — E538 Deficiency of other specified B group vitamins: Secondary | ICD-10-CM | POA: Diagnosis not present

## 2016-04-14 DIAGNOSIS — E291 Testicular hypofunction: Secondary | ICD-10-CM | POA: Diagnosis not present

## 2016-04-28 DIAGNOSIS — E538 Deficiency of other specified B group vitamins: Secondary | ICD-10-CM | POA: Diagnosis not present

## 2016-04-28 DIAGNOSIS — E291 Testicular hypofunction: Secondary | ICD-10-CM | POA: Diagnosis not present

## 2016-04-28 DIAGNOSIS — Z23 Encounter for immunization: Secondary | ICD-10-CM | POA: Diagnosis not present

## 2016-04-28 DIAGNOSIS — Z5181 Encounter for therapeutic drug level monitoring: Secondary | ICD-10-CM | POA: Diagnosis not present

## 2016-04-28 DIAGNOSIS — E559 Vitamin D deficiency, unspecified: Secondary | ICD-10-CM | POA: Diagnosis not present

## 2016-04-28 DIAGNOSIS — E039 Hypothyroidism, unspecified: Secondary | ICD-10-CM | POA: Diagnosis not present

## 2016-04-28 DIAGNOSIS — E1142 Type 2 diabetes mellitus with diabetic polyneuropathy: Secondary | ICD-10-CM | POA: Diagnosis not present

## 2016-04-28 DIAGNOSIS — Z7984 Long term (current) use of oral hypoglycemic drugs: Secondary | ICD-10-CM | POA: Diagnosis not present

## 2016-04-28 DIAGNOSIS — R197 Diarrhea, unspecified: Secondary | ICD-10-CM | POA: Diagnosis not present

## 2016-05-05 DIAGNOSIS — E538 Deficiency of other specified B group vitamins: Secondary | ICD-10-CM | POA: Diagnosis not present

## 2016-05-05 DIAGNOSIS — Z7984 Long term (current) use of oral hypoglycemic drugs: Secondary | ICD-10-CM | POA: Diagnosis not present

## 2016-05-05 DIAGNOSIS — E039 Hypothyroidism, unspecified: Secondary | ICD-10-CM | POA: Diagnosis not present

## 2016-05-05 DIAGNOSIS — E559 Vitamin D deficiency, unspecified: Secondary | ICD-10-CM | POA: Diagnosis not present

## 2016-05-05 DIAGNOSIS — E1142 Type 2 diabetes mellitus with diabetic polyneuropathy: Secondary | ICD-10-CM | POA: Diagnosis not present

## 2016-05-05 DIAGNOSIS — E291 Testicular hypofunction: Secondary | ICD-10-CM | POA: Diagnosis not present

## 2016-05-12 DIAGNOSIS — E538 Deficiency of other specified B group vitamins: Secondary | ICD-10-CM | POA: Diagnosis not present

## 2016-05-12 DIAGNOSIS — E291 Testicular hypofunction: Secondary | ICD-10-CM | POA: Diagnosis not present

## 2016-05-21 DIAGNOSIS — E785 Hyperlipidemia, unspecified: Secondary | ICD-10-CM | POA: Diagnosis not present

## 2016-05-21 DIAGNOSIS — E119 Type 2 diabetes mellitus without complications: Secondary | ICD-10-CM | POA: Diagnosis not present

## 2016-05-21 DIAGNOSIS — M67919 Unspecified disorder of synovium and tendon, unspecified shoulder: Secondary | ICD-10-CM | POA: Diagnosis not present

## 2016-05-21 DIAGNOSIS — M542 Cervicalgia: Secondary | ICD-10-CM | POA: Diagnosis not present

## 2016-05-22 ENCOUNTER — Encounter: Payer: Self-pay | Admitting: Sports Medicine

## 2016-05-22 ENCOUNTER — Ambulatory Visit (INDEPENDENT_AMBULATORY_CARE_PROVIDER_SITE_OTHER): Payer: Medicare Other | Admitting: Sports Medicine

## 2016-05-22 DIAGNOSIS — M79676 Pain in unspecified toe(s): Secondary | ICD-10-CM

## 2016-05-22 DIAGNOSIS — B351 Tinea unguium: Secondary | ICD-10-CM

## 2016-05-22 DIAGNOSIS — E1140 Type 2 diabetes mellitus with diabetic neuropathy, unspecified: Secondary | ICD-10-CM

## 2016-05-22 NOTE — Progress Notes (Signed)
Patient ID: [name], male   DOB: 1934-11-11, 82 y.o.   MRN: [id number] Subjective: Alan Beltran is a 82 y.o. male patient with history of diabetes who returns to office today complaining of long, painful nails  while ambulating in shoes; unable to trim. Patient states that the glucose reading was "good". Patient denies any new changes in medication or new problems.  Patient Active Problem List   Diagnosis Date Noted  . Diabetes mellitus 09/04/2010   Current Outpatient Prescriptions on File Prior to Visit  Medication Sig Dispense Refill  . ALLOPURINOL PO Take by mouth.      . FOLIC ACID PO Take by mouth.      . Levothyroxine Sodium (SYNTHROID PO) Take by mouth.      . OMEPRAZOLE PO Take by mouth.      . Pioglitazone HCl (ACTOS PO) Take by mouth.      . Rosuvastatin Calcium (CRESTOR PO) Take by mouth.      . SERTRALINE HCL PO Take by mouth.      . SPIRONOLACTONE PO Take by mouth.      . TERAZOSIN HCL PO Take by mouth.      . TRUEPLUS LANCETS 33G MISC   5  . TRUETRACK TEST test strip   4  . WARFARIN SODIUM PO Take by mouth.      . Zinc Sulfate (ZINC 15 PO) Take by mouth.       No current facility-administered medications on file prior to visit.    No Known Allergies  No results found for this or any previous visit (from the past 2160 hour(s)).  Objective: General: Patient is awake, alert, and oriented x 3 and in no acute distress.  Integument: Skin is warm, dry and supple bilateral. Nails are tender, long, thickened and dystrophic with subungual debris, consistent with onychomycosis, 1-5 bilateral. No signs of infection. No open lesions or preulcerative lesions present bilateral. Remaining integument unremarkable.  Vasculature:  Dorsalis Pedis pulse 1/4 bilateral. Posterior Tibial pulse  1/4 bilateral. Capillary fill time <3 sec 1-5 bilateral. Diminished hair growth to the level of the digits.Temperature gradient within normal limits. No varicosities present bilateral. No edema  present bilateral.   Neurology: The patient has intact sensation measured with a 5.07/10g Semmes Weinstein Monofilament at all pedal sites bilateral . Vibratory sensation diminished bilateral with tuning fork. No Babinski sign present bilateral.   Musculoskeletal: Asymptomatic hammertoe pedal deformities noted bilateral. Muscular strength 5/5 in all lower extremity muscular groups bilateral without pain on range of motion . No tenderness with calf compression bilateral.  Assessment and Plan: Problem List Items Addressed This Visit    None    Visit Diagnoses    Pain due to onychomycosis of toenail    -  Primary   Type 2 diabetes, controlled, with neuropathy ([place])         -Examined patient. -Discussed and educated patient on diabetic foot care, especially with  regards to the vascular, neurological and musculoskeletal systems.  -Stressed the importance of good glycemic control and the detriment of not  controlling glucose levels in relation to the foot. -Mechanically debrided all nails 1-5 bilateral using sterile nail nipper and filed with dremel without incident  -Continue with Diabetic shoes  -Answered all patient questions -Patient to return in 3 months for at risk foot care/nail care -Patient advised to call the office if any problems or questions arise in the meantime.  Landis Martins, DPM

## 2016-05-26 DIAGNOSIS — E039 Hypothyroidism, unspecified: Secondary | ICD-10-CM | POA: Diagnosis not present

## 2016-05-26 DIAGNOSIS — E538 Deficiency of other specified B group vitamins: Secondary | ICD-10-CM | POA: Diagnosis not present

## 2016-05-29 DIAGNOSIS — M25512 Pain in left shoulder: Secondary | ICD-10-CM | POA: Diagnosis not present

## 2016-05-29 DIAGNOSIS — M25511 Pain in right shoulder: Secondary | ICD-10-CM | POA: Diagnosis not present

## 2016-05-29 DIAGNOSIS — R29898 Other symptoms and signs involving the musculoskeletal system: Secondary | ICD-10-CM | POA: Diagnosis not present

## 2016-05-29 DIAGNOSIS — J45909 Unspecified asthma, uncomplicated: Secondary | ICD-10-CM | POA: Diagnosis not present

## 2016-05-29 DIAGNOSIS — M542 Cervicalgia: Secondary | ICD-10-CM | POA: Diagnosis not present

## 2016-06-03 DIAGNOSIS — S0990XA Unspecified injury of head, initial encounter: Secondary | ICD-10-CM | POA: Diagnosis not present

## 2016-06-03 DIAGNOSIS — I6789 Other cerebrovascular disease: Secondary | ICD-10-CM | POA: Diagnosis not present

## 2016-06-03 DIAGNOSIS — R531 Weakness: Secondary | ICD-10-CM | POA: Diagnosis not present

## 2016-06-03 DIAGNOSIS — R0902 Hypoxemia: Secondary | ICD-10-CM | POA: Diagnosis not present

## 2016-06-03 DIAGNOSIS — Z8679 Personal history of other diseases of the circulatory system: Secondary | ICD-10-CM | POA: Diagnosis not present

## 2016-06-03 DIAGNOSIS — E871 Hypo-osmolality and hyponatremia: Secondary | ICD-10-CM | POA: Diagnosis not present

## 2016-06-03 DIAGNOSIS — R079 Chest pain, unspecified: Secondary | ICD-10-CM | POA: Diagnosis not present

## 2016-06-03 DIAGNOSIS — Z951 Presence of aortocoronary bypass graft: Secondary | ICD-10-CM | POA: Diagnosis not present

## 2016-06-03 DIAGNOSIS — J181 Lobar pneumonia, unspecified organism: Secondary | ICD-10-CM | POA: Diagnosis not present

## 2016-06-03 DIAGNOSIS — Z7984 Long term (current) use of oral hypoglycemic drugs: Secondary | ICD-10-CM | POA: Diagnosis not present

## 2016-06-03 DIAGNOSIS — J440 Chronic obstructive pulmonary disease with acute lower respiratory infection: Secondary | ICD-10-CM | POA: Diagnosis not present

## 2016-06-03 DIAGNOSIS — J690 Pneumonitis due to inhalation of food and vomit: Secondary | ICD-10-CM | POA: Diagnosis not present

## 2016-06-03 DIAGNOSIS — R0602 Shortness of breath: Secondary | ICD-10-CM | POA: Diagnosis not present

## 2016-06-03 DIAGNOSIS — Z8673 Personal history of transient ischemic attack (TIA), and cerebral infarction without residual deficits: Secondary | ICD-10-CM | POA: Diagnosis not present

## 2016-06-03 DIAGNOSIS — E119 Type 2 diabetes mellitus without complications: Secondary | ICD-10-CM | POA: Diagnosis not present

## 2016-06-03 DIAGNOSIS — J441 Chronic obstructive pulmonary disease with (acute) exacerbation: Secondary | ICD-10-CM | POA: Diagnosis not present

## 2016-06-03 DIAGNOSIS — J189 Pneumonia, unspecified organism: Secondary | ICD-10-CM | POA: Diagnosis not present

## 2016-06-04 DIAGNOSIS — R0902 Hypoxemia: Secondary | ICD-10-CM | POA: Diagnosis not present

## 2016-06-04 DIAGNOSIS — J189 Pneumonia, unspecified organism: Secondary | ICD-10-CM | POA: Diagnosis not present

## 2016-06-09 DIAGNOSIS — E291 Testicular hypofunction: Secondary | ICD-10-CM | POA: Diagnosis not present

## 2016-06-09 DIAGNOSIS — E539 Vitamin B deficiency, unspecified: Secondary | ICD-10-CM | POA: Diagnosis not present

## 2016-06-10 DIAGNOSIS — I4892 Unspecified atrial flutter: Secondary | ICD-10-CM | POA: Diagnosis not present

## 2016-06-10 DIAGNOSIS — I4891 Unspecified atrial fibrillation: Secondary | ICD-10-CM | POA: Diagnosis not present

## 2016-06-11 DIAGNOSIS — E785 Hyperlipidemia, unspecified: Secondary | ICD-10-CM | POA: Diagnosis not present

## 2016-06-11 DIAGNOSIS — E119 Type 2 diabetes mellitus without complications: Secondary | ICD-10-CM | POA: Diagnosis not present

## 2016-06-11 DIAGNOSIS — I10 Essential (primary) hypertension: Secondary | ICD-10-CM | POA: Diagnosis not present

## 2016-06-11 DIAGNOSIS — J189 Pneumonia, unspecified organism: Secondary | ICD-10-CM | POA: Diagnosis not present

## 2016-06-12 DIAGNOSIS — M5136 Other intervertebral disc degeneration, lumbar region: Secondary | ICD-10-CM | POA: Diagnosis not present

## 2016-06-12 DIAGNOSIS — M5021 Other cervical disc displacement,  high cervical region: Secondary | ICD-10-CM | POA: Diagnosis not present

## 2016-06-12 DIAGNOSIS — M5126 Other intervertebral disc displacement, lumbar region: Secondary | ICD-10-CM | POA: Diagnosis not present

## 2016-06-12 DIAGNOSIS — M47816 Spondylosis without myelopathy or radiculopathy, lumbar region: Secondary | ICD-10-CM | POA: Diagnosis not present

## 2016-06-12 DIAGNOSIS — M48061 Spinal stenosis, lumbar region without neurogenic claudication: Secondary | ICD-10-CM | POA: Diagnosis not present

## 2016-06-12 DIAGNOSIS — M5030 Other cervical disc degeneration, unspecified cervical region: Secondary | ICD-10-CM | POA: Diagnosis not present

## 2016-06-12 DIAGNOSIS — R29898 Other symptoms and signs involving the musculoskeletal system: Secondary | ICD-10-CM | POA: Diagnosis not present

## 2016-06-12 DIAGNOSIS — M5412 Radiculopathy, cervical region: Secondary | ICD-10-CM | POA: Diagnosis not present

## 2016-06-12 DIAGNOSIS — M4316 Spondylolisthesis, lumbar region: Secondary | ICD-10-CM | POA: Diagnosis not present

## 2016-06-12 DIAGNOSIS — M542 Cervicalgia: Secondary | ICD-10-CM | POA: Diagnosis not present

## 2016-06-12 DIAGNOSIS — M9971 Connective tissue and disc stenosis of intervertebral foramina of cervical region: Secondary | ICD-10-CM | POA: Diagnosis not present

## 2016-06-23 DIAGNOSIS — E538 Deficiency of other specified B group vitamins: Secondary | ICD-10-CM | POA: Diagnosis not present

## 2016-06-23 DIAGNOSIS — E291 Testicular hypofunction: Secondary | ICD-10-CM | POA: Diagnosis not present

## 2016-07-06 DIAGNOSIS — R29898 Other symptoms and signs involving the musculoskeletal system: Secondary | ICD-10-CM | POA: Diagnosis not present

## 2016-07-07 DIAGNOSIS — E538 Deficiency of other specified B group vitamins: Secondary | ICD-10-CM | POA: Diagnosis not present

## 2016-07-07 DIAGNOSIS — E291 Testicular hypofunction: Secondary | ICD-10-CM | POA: Diagnosis not present

## 2016-07-18 DIAGNOSIS — R404 Transient alteration of awareness: Secondary | ICD-10-CM | POA: Diagnosis not present

## 2016-07-18 DIAGNOSIS — R531 Weakness: Secondary | ICD-10-CM | POA: Diagnosis not present

## 2016-07-18 DIAGNOSIS — I10 Essential (primary) hypertension: Secondary | ICD-10-CM | POA: Diagnosis present

## 2016-07-18 DIAGNOSIS — A419 Sepsis, unspecified organism: Secondary | ICD-10-CM | POA: Diagnosis not present

## 2016-07-18 DIAGNOSIS — R918 Other nonspecific abnormal finding of lung field: Secondary | ICD-10-CM | POA: Diagnosis not present

## 2016-07-18 DIAGNOSIS — R29810 Facial weakness: Secondary | ICD-10-CM | POA: Diagnosis not present

## 2016-07-18 DIAGNOSIS — Z79899 Other long term (current) drug therapy: Secondary | ICD-10-CM | POA: Diagnosis not present

## 2016-07-18 DIAGNOSIS — J189 Pneumonia, unspecified organism: Secondary | ICD-10-CM | POA: Diagnosis not present

## 2016-07-18 DIAGNOSIS — I4891 Unspecified atrial fibrillation: Secondary | ICD-10-CM | POA: Diagnosis present

## 2016-07-18 DIAGNOSIS — Z951 Presence of aortocoronary bypass graft: Secondary | ICD-10-CM | POA: Diagnosis not present

## 2016-07-18 DIAGNOSIS — Z8673 Personal history of transient ischemic attack (TIA), and cerebral infarction without residual deficits: Secondary | ICD-10-CM | POA: Diagnosis not present

## 2016-07-18 DIAGNOSIS — J181 Lobar pneumonia, unspecified organism: Secondary | ICD-10-CM | POA: Diagnosis not present

## 2016-07-18 DIAGNOSIS — E119 Type 2 diabetes mellitus without complications: Secondary | ICD-10-CM | POA: Diagnosis not present

## 2016-07-18 DIAGNOSIS — I252 Old myocardial infarction: Secondary | ICD-10-CM | POA: Diagnosis not present

## 2016-07-18 DIAGNOSIS — Z9989 Dependence on other enabling machines and devices: Secondary | ICD-10-CM | POA: Diagnosis not present

## 2016-07-18 DIAGNOSIS — Z7984 Long term (current) use of oral hypoglycemic drugs: Secondary | ICD-10-CM | POA: Diagnosis not present

## 2016-07-18 DIAGNOSIS — G4733 Obstructive sleep apnea (adult) (pediatric): Secondary | ICD-10-CM | POA: Diagnosis not present

## 2016-07-18 DIAGNOSIS — E871 Hypo-osmolality and hyponatremia: Secondary | ICD-10-CM | POA: Diagnosis not present

## 2016-07-18 DIAGNOSIS — J45901 Unspecified asthma with (acute) exacerbation: Secondary | ICD-10-CM | POA: Diagnosis not present

## 2016-07-21 DIAGNOSIS — E291 Testicular hypofunction: Secondary | ICD-10-CM | POA: Diagnosis not present

## 2016-07-21 DIAGNOSIS — E538 Deficiency of other specified B group vitamins: Secondary | ICD-10-CM | POA: Diagnosis not present

## 2016-07-27 DIAGNOSIS — J189 Pneumonia, unspecified organism: Secondary | ICD-10-CM | POA: Diagnosis not present

## 2016-07-27 DIAGNOSIS — E1159 Type 2 diabetes mellitus with other circulatory complications: Secondary | ICD-10-CM | POA: Diagnosis not present

## 2016-07-27 DIAGNOSIS — I259 Chronic ischemic heart disease, unspecified: Secondary | ICD-10-CM | POA: Diagnosis not present

## 2016-07-27 DIAGNOSIS — Z7984 Long term (current) use of oral hypoglycemic drugs: Secondary | ICD-10-CM | POA: Diagnosis not present

## 2016-07-27 DIAGNOSIS — E039 Hypothyroidism, unspecified: Secondary | ICD-10-CM | POA: Diagnosis not present

## 2016-07-27 DIAGNOSIS — E785 Hyperlipidemia, unspecified: Secondary | ICD-10-CM | POA: Diagnosis not present

## 2016-07-27 DIAGNOSIS — Z79899 Other long term (current) drug therapy: Secondary | ICD-10-CM | POA: Diagnosis not present

## 2016-08-04 DIAGNOSIS — E039 Hypothyroidism, unspecified: Secondary | ICD-10-CM | POA: Diagnosis not present

## 2016-08-04 DIAGNOSIS — E538 Deficiency of other specified B group vitamins: Secondary | ICD-10-CM | POA: Diagnosis not present

## 2016-08-06 DIAGNOSIS — R2681 Unsteadiness on feet: Secondary | ICD-10-CM | POA: Diagnosis not present

## 2016-08-06 DIAGNOSIS — R5383 Other fatigue: Secondary | ICD-10-CM | POA: Diagnosis not present

## 2016-08-06 DIAGNOSIS — M6281 Muscle weakness (generalized): Secondary | ICD-10-CM | POA: Diagnosis not present

## 2016-08-06 DIAGNOSIS — R293 Abnormal posture: Secondary | ICD-10-CM | POA: Diagnosis not present

## 2016-08-06 DIAGNOSIS — R2689 Other abnormalities of gait and mobility: Secondary | ICD-10-CM | POA: Diagnosis not present

## 2016-08-06 DIAGNOSIS — R201 Hypoesthesia of skin: Secondary | ICD-10-CM | POA: Diagnosis not present

## 2016-08-11 DIAGNOSIS — M6281 Muscle weakness (generalized): Secondary | ICD-10-CM | POA: Diagnosis not present

## 2016-08-11 DIAGNOSIS — R2681 Unsteadiness on feet: Secondary | ICD-10-CM | POA: Diagnosis not present

## 2016-08-11 DIAGNOSIS — R2689 Other abnormalities of gait and mobility: Secondary | ICD-10-CM | POA: Diagnosis not present

## 2016-08-11 DIAGNOSIS — R5383 Other fatigue: Secondary | ICD-10-CM | POA: Diagnosis not present

## 2016-08-11 DIAGNOSIS — R293 Abnormal posture: Secondary | ICD-10-CM | POA: Diagnosis not present

## 2016-08-11 DIAGNOSIS — R201 Hypoesthesia of skin: Secondary | ICD-10-CM | POA: Diagnosis not present

## 2016-08-13 DIAGNOSIS — R293 Abnormal posture: Secondary | ICD-10-CM | POA: Diagnosis not present

## 2016-08-13 DIAGNOSIS — R2689 Other abnormalities of gait and mobility: Secondary | ICD-10-CM | POA: Diagnosis not present

## 2016-08-13 DIAGNOSIS — R201 Hypoesthesia of skin: Secondary | ICD-10-CM | POA: Diagnosis not present

## 2016-08-13 DIAGNOSIS — R5383 Other fatigue: Secondary | ICD-10-CM | POA: Diagnosis not present

## 2016-08-13 DIAGNOSIS — M6281 Muscle weakness (generalized): Secondary | ICD-10-CM | POA: Diagnosis not present

## 2016-08-13 DIAGNOSIS — R2681 Unsteadiness on feet: Secondary | ICD-10-CM | POA: Diagnosis not present

## 2016-08-17 DIAGNOSIS — R5383 Other fatigue: Secondary | ICD-10-CM | POA: Diagnosis not present

## 2016-08-17 DIAGNOSIS — R201 Hypoesthesia of skin: Secondary | ICD-10-CM | POA: Diagnosis not present

## 2016-08-17 DIAGNOSIS — R293 Abnormal posture: Secondary | ICD-10-CM | POA: Diagnosis not present

## 2016-08-17 DIAGNOSIS — R2689 Other abnormalities of gait and mobility: Secondary | ICD-10-CM | POA: Diagnosis not present

## 2016-08-17 DIAGNOSIS — R2681 Unsteadiness on feet: Secondary | ICD-10-CM | POA: Diagnosis not present

## 2016-08-17 DIAGNOSIS — M6281 Muscle weakness (generalized): Secondary | ICD-10-CM | POA: Diagnosis not present

## 2016-08-18 DIAGNOSIS — E291 Testicular hypofunction: Secondary | ICD-10-CM | POA: Diagnosis not present

## 2016-08-18 DIAGNOSIS — E538 Deficiency of other specified B group vitamins: Secondary | ICD-10-CM | POA: Diagnosis not present

## 2016-08-19 DIAGNOSIS — M6281 Muscle weakness (generalized): Secondary | ICD-10-CM | POA: Diagnosis not present

## 2016-08-19 DIAGNOSIS — R201 Hypoesthesia of skin: Secondary | ICD-10-CM | POA: Diagnosis not present

## 2016-08-19 DIAGNOSIS — R293 Abnormal posture: Secondary | ICD-10-CM | POA: Diagnosis not present

## 2016-08-19 DIAGNOSIS — R2681 Unsteadiness on feet: Secondary | ICD-10-CM | POA: Diagnosis not present

## 2016-08-19 DIAGNOSIS — R2689 Other abnormalities of gait and mobility: Secondary | ICD-10-CM | POA: Diagnosis not present

## 2016-08-19 DIAGNOSIS — R5383 Other fatigue: Secondary | ICD-10-CM | POA: Diagnosis not present

## 2016-08-21 ENCOUNTER — Ambulatory Visit (INDEPENDENT_AMBULATORY_CARE_PROVIDER_SITE_OTHER): Payer: Medicare Other | Admitting: Sports Medicine

## 2016-08-21 DIAGNOSIS — M2041 Other hammer toe(s) (acquired), right foot: Secondary | ICD-10-CM

## 2016-08-21 DIAGNOSIS — E1140 Type 2 diabetes mellitus with diabetic neuropathy, unspecified: Secondary | ICD-10-CM | POA: Diagnosis not present

## 2016-08-21 DIAGNOSIS — B351 Tinea unguium: Secondary | ICD-10-CM

## 2016-08-21 DIAGNOSIS — M1990 Unspecified osteoarthritis, unspecified site: Secondary | ICD-10-CM

## 2016-08-21 DIAGNOSIS — M79676 Pain in unspecified toe(s): Secondary | ICD-10-CM | POA: Diagnosis not present

## 2016-08-21 DIAGNOSIS — M2042 Other hammer toe(s) (acquired), left foot: Secondary | ICD-10-CM

## 2016-08-22 NOTE — Progress Notes (Signed)
Patient ID: Alan Beltran, male   DOB: 07/20/1934, 82 y.o.   MRN: [id number] Subjective: Alan Beltran is a 82 y.o. male patient with history of diabetes who returns to office today complaining of long, painful nails  while ambulating in shoes; unable to trim. Patient states that the glucose reading was "good". Had pneumonia and was in hospital twice. Patient denies any new changes in medication or other new problems. Desires new pair of diabetic shoes.   Patient Active Problem List   Diagnosis Date Noted  . Diabetes mellitus 09/04/2010   Current Outpatient Prescriptions on File Prior to Visit  Medication Sig Dispense Refill  . ALLOPURINOL PO Take by mouth.      . FOLIC ACID PO Take by mouth.      . Levothyroxine Sodium (SYNTHROID PO) Take by mouth.      . OMEPRAZOLE PO Take by mouth.      . Pioglitazone HCl (ACTOS PO) Take by mouth.      . Rosuvastatin Calcium (CRESTOR PO) Take by mouth.      . SERTRALINE HCL PO Take by mouth.      . SPIRONOLACTONE PO Take by mouth.      . TERAZOSIN HCL PO Take by mouth.      . TRUEPLUS LANCETS 33G MISC   5  . TRUETRACK TEST test strip   4  . WARFARIN SODIUM PO Take by mouth.      . Zinc Sulfate (ZINC 15 PO) Take by mouth.       No current facility-administered medications on file prior to visit.    No Known Allergies  No results found for this or any previous visit (from the past 2160 hour(s)).  Objective: General: Patient is awake, alert, and oriented x 3 and in no acute distress.  Integument: Skin is warm, dry and supple bilateral. Nails are tender, long, thickened and dystrophic with subungual debris, consistent with onychomycosis, 1-5 bilateral. No signs of infection. No open lesions or preulcerative lesions present bilateral. Remaining integument unremarkable.  Vasculature:  Dorsalis Pedis pulse 1/4 bilateral. Posterior Tibial pulse  1/4 bilateral. Capillary fill time <3 sec 1-5 bilateral. Diminished hair growth to the level of the  digits.Temperature gradient within normal limits. No varicosities present bilateral. No edema present bilateral.   Neurology: The patient has intact sensation measured with a 5.07/10g Semmes Weinstein Monofilament at all pedal sites bilateral . Vibratory sensation diminished bilateral with tuning fork. No Babinski sign present bilateral.   Musculoskeletal: Asymptomatic hammertoe pedal deformities noted bilateral. Muscular strength 5/5 in all lower extremity muscular groups bilateral without pain on range of motion. No tenderness with calf compression bilateral.  Assessment and Plan: Problem List Items Addressed This Visit    None    Visit Diagnoses    Pain due to onychomycosis of toenail    -  Primary   Type 2 diabetes, controlled, with neuropathy ([facility])       Hammer toes of both feet       Chronic arthritis         -Examined patient. -Discussed and educated patient on diabetic foot care, especially with  regards to the vascular, neurological and musculoskeletal systems.  -Stressed the importance of good glycemic control and the detriment of not  controlling glucose levels in relation to the foot. -Mechanically debrided all nails 1-5 bilateral using sterile nail nipper and filed with dremel without incident  -[employer] diabetic shoe order form was completed; office to contact primary care for approval /  certification;  Office to arrange shoe fitting and dispensing. -Answered all patient questions -Patient to return in 3 months for at risk foot care/nail care -Patient advised to call the office if any problems or questions arise in the meantime.  Alan Beltran, DPM

## 2016-08-25 DIAGNOSIS — Z8701 Personal history of pneumonia (recurrent): Secondary | ICD-10-CM | POA: Diagnosis not present

## 2016-08-25 DIAGNOSIS — R0602 Shortness of breath: Secondary | ICD-10-CM | POA: Diagnosis not present

## 2016-08-25 DIAGNOSIS — R5383 Other fatigue: Secondary | ICD-10-CM | POA: Diagnosis not present

## 2016-08-25 DIAGNOSIS — R2689 Other abnormalities of gait and mobility: Secondary | ICD-10-CM | POA: Diagnosis not present

## 2016-08-25 DIAGNOSIS — R2681 Unsteadiness on feet: Secondary | ICD-10-CM | POA: Diagnosis not present

## 2016-08-25 DIAGNOSIS — I5030 Unspecified diastolic (congestive) heart failure: Secondary | ICD-10-CM | POA: Diagnosis not present

## 2016-08-25 DIAGNOSIS — R201 Hypoesthesia of skin: Secondary | ICD-10-CM | POA: Diagnosis not present

## 2016-08-25 DIAGNOSIS — I10 Essential (primary) hypertension: Secondary | ICD-10-CM | POA: Diagnosis not present

## 2016-08-25 DIAGNOSIS — M6281 Muscle weakness (generalized): Secondary | ICD-10-CM | POA: Diagnosis not present

## 2016-08-25 DIAGNOSIS — R293 Abnormal posture: Secondary | ICD-10-CM | POA: Diagnosis not present

## 2016-08-27 DIAGNOSIS — M6281 Muscle weakness (generalized): Secondary | ICD-10-CM | POA: Diagnosis not present

## 2016-08-27 DIAGNOSIS — R201 Hypoesthesia of skin: Secondary | ICD-10-CM | POA: Diagnosis not present

## 2016-08-27 DIAGNOSIS — R293 Abnormal posture: Secondary | ICD-10-CM | POA: Diagnosis not present

## 2016-08-27 DIAGNOSIS — R2689 Other abnormalities of gait and mobility: Secondary | ICD-10-CM | POA: Diagnosis not present

## 2016-08-27 DIAGNOSIS — R2681 Unsteadiness on feet: Secondary | ICD-10-CM | POA: Diagnosis not present

## 2016-08-27 DIAGNOSIS — R5383 Other fatigue: Secondary | ICD-10-CM | POA: Diagnosis not present

## 2016-08-31 DIAGNOSIS — R2681 Unsteadiness on feet: Secondary | ICD-10-CM | POA: Diagnosis not present

## 2016-08-31 DIAGNOSIS — R5383 Other fatigue: Secondary | ICD-10-CM | POA: Diagnosis not present

## 2016-08-31 DIAGNOSIS — R201 Hypoesthesia of skin: Secondary | ICD-10-CM | POA: Diagnosis not present

## 2016-08-31 DIAGNOSIS — M6281 Muscle weakness (generalized): Secondary | ICD-10-CM | POA: Diagnosis not present

## 2016-08-31 DIAGNOSIS — R2689 Other abnormalities of gait and mobility: Secondary | ICD-10-CM | POA: Diagnosis not present

## 2016-08-31 DIAGNOSIS — R293 Abnormal posture: Secondary | ICD-10-CM | POA: Diagnosis not present

## 2016-09-01 DIAGNOSIS — E538 Deficiency of other specified B group vitamins: Secondary | ICD-10-CM | POA: Diagnosis not present

## 2016-09-01 DIAGNOSIS — E291 Testicular hypofunction: Secondary | ICD-10-CM | POA: Diagnosis not present

## 2016-09-08 DIAGNOSIS — M6281 Muscle weakness (generalized): Secondary | ICD-10-CM | POA: Diagnosis not present

## 2016-09-08 DIAGNOSIS — R5383 Other fatigue: Secondary | ICD-10-CM | POA: Diagnosis not present

## 2016-09-08 DIAGNOSIS — R293 Abnormal posture: Secondary | ICD-10-CM | POA: Diagnosis not present

## 2016-09-08 DIAGNOSIS — R201 Hypoesthesia of skin: Secondary | ICD-10-CM | POA: Diagnosis not present

## 2016-09-08 DIAGNOSIS — R2689 Other abnormalities of gait and mobility: Secondary | ICD-10-CM | POA: Diagnosis not present

## 2016-09-08 DIAGNOSIS — R2681 Unsteadiness on feet: Secondary | ICD-10-CM | POA: Diagnosis not present

## 2016-09-10 DIAGNOSIS — R2689 Other abnormalities of gait and mobility: Secondary | ICD-10-CM | POA: Diagnosis not present

## 2016-09-10 DIAGNOSIS — R293 Abnormal posture: Secondary | ICD-10-CM | POA: Diagnosis not present

## 2016-09-10 DIAGNOSIS — R2681 Unsteadiness on feet: Secondary | ICD-10-CM | POA: Diagnosis not present

## 2016-09-10 DIAGNOSIS — R201 Hypoesthesia of skin: Secondary | ICD-10-CM | POA: Diagnosis not present

## 2016-09-10 DIAGNOSIS — R5383 Other fatigue: Secondary | ICD-10-CM | POA: Diagnosis not present

## 2016-09-10 DIAGNOSIS — M6281 Muscle weakness (generalized): Secondary | ICD-10-CM | POA: Diagnosis not present

## 2016-09-14 DIAGNOSIS — R2689 Other abnormalities of gait and mobility: Secondary | ICD-10-CM | POA: Diagnosis not present

## 2016-09-14 DIAGNOSIS — R201 Hypoesthesia of skin: Secondary | ICD-10-CM | POA: Diagnosis not present

## 2016-09-14 DIAGNOSIS — R2681 Unsteadiness on feet: Secondary | ICD-10-CM | POA: Diagnosis not present

## 2016-09-14 DIAGNOSIS — R293 Abnormal posture: Secondary | ICD-10-CM | POA: Diagnosis not present

## 2016-09-14 DIAGNOSIS — R5383 Other fatigue: Secondary | ICD-10-CM | POA: Diagnosis not present

## 2016-09-14 DIAGNOSIS — M6281 Muscle weakness (generalized): Secondary | ICD-10-CM | POA: Diagnosis not present

## 2016-09-15 DIAGNOSIS — E291 Testicular hypofunction: Secondary | ICD-10-CM | POA: Diagnosis not present

## 2016-09-15 DIAGNOSIS — E538 Deficiency of other specified B group vitamins: Secondary | ICD-10-CM | POA: Diagnosis not present

## 2016-09-21 DIAGNOSIS — H35373 Puckering of macula, bilateral: Secondary | ICD-10-CM | POA: Diagnosis not present

## 2016-09-23 DIAGNOSIS — R5383 Other fatigue: Secondary | ICD-10-CM | POA: Diagnosis not present

## 2016-09-23 DIAGNOSIS — R293 Abnormal posture: Secondary | ICD-10-CM | POA: Diagnosis not present

## 2016-09-23 DIAGNOSIS — R2689 Other abnormalities of gait and mobility: Secondary | ICD-10-CM | POA: Diagnosis not present

## 2016-09-23 DIAGNOSIS — R2681 Unsteadiness on feet: Secondary | ICD-10-CM | POA: Diagnosis not present

## 2016-09-23 DIAGNOSIS — R201 Hypoesthesia of skin: Secondary | ICD-10-CM | POA: Diagnosis not present

## 2016-09-23 DIAGNOSIS — I4891 Unspecified atrial fibrillation: Secondary | ICD-10-CM | POA: Diagnosis not present

## 2016-09-23 DIAGNOSIS — M6281 Muscle weakness (generalized): Secondary | ICD-10-CM | POA: Diagnosis not present

## 2016-09-25 DIAGNOSIS — R2681 Unsteadiness on feet: Secondary | ICD-10-CM | POA: Diagnosis not present

## 2016-09-25 DIAGNOSIS — R5383 Other fatigue: Secondary | ICD-10-CM | POA: Diagnosis not present

## 2016-09-25 DIAGNOSIS — R2689 Other abnormalities of gait and mobility: Secondary | ICD-10-CM | POA: Diagnosis not present

## 2016-09-25 DIAGNOSIS — R293 Abnormal posture: Secondary | ICD-10-CM | POA: Diagnosis not present

## 2016-09-25 DIAGNOSIS — R201 Hypoesthesia of skin: Secondary | ICD-10-CM | POA: Diagnosis not present

## 2016-09-25 DIAGNOSIS — M6281 Muscle weakness (generalized): Secondary | ICD-10-CM | POA: Diagnosis not present

## 2016-09-29 DIAGNOSIS — E538 Deficiency of other specified B group vitamins: Secondary | ICD-10-CM | POA: Diagnosis not present

## 2016-09-29 DIAGNOSIS — E236 Other disorders of pituitary gland: Secondary | ICD-10-CM | POA: Diagnosis not present

## 2016-09-30 DIAGNOSIS — K219 Gastro-esophageal reflux disease without esophagitis: Secondary | ICD-10-CM | POA: Diagnosis not present

## 2016-09-30 DIAGNOSIS — E039 Hypothyroidism, unspecified: Secondary | ICD-10-CM | POA: Diagnosis not present

## 2016-09-30 DIAGNOSIS — Z79899 Other long term (current) drug therapy: Secondary | ICD-10-CM | POA: Diagnosis not present

## 2016-09-30 DIAGNOSIS — M109 Gout, unspecified: Secondary | ICD-10-CM | POA: Diagnosis not present

## 2016-09-30 DIAGNOSIS — Z6836 Body mass index (BMI) 36.0-36.9, adult: Secondary | ICD-10-CM | POA: Diagnosis not present

## 2016-09-30 DIAGNOSIS — E785 Hyperlipidemia, unspecified: Secondary | ICD-10-CM | POA: Diagnosis not present

## 2016-09-30 DIAGNOSIS — E1159 Type 2 diabetes mellitus with other circulatory complications: Secondary | ICD-10-CM | POA: Diagnosis not present

## 2016-09-30 DIAGNOSIS — Z7984 Long term (current) use of oral hypoglycemic drugs: Secondary | ICD-10-CM | POA: Diagnosis not present

## 2016-10-01 DIAGNOSIS — G629 Polyneuropathy, unspecified: Secondary | ICD-10-CM | POA: Diagnosis not present

## 2016-10-02 DIAGNOSIS — R2681 Unsteadiness on feet: Secondary | ICD-10-CM | POA: Diagnosis not present

## 2016-10-02 DIAGNOSIS — M6281 Muscle weakness (generalized): Secondary | ICD-10-CM | POA: Diagnosis not present

## 2016-10-02 DIAGNOSIS — R293 Abnormal posture: Secondary | ICD-10-CM | POA: Diagnosis not present

## 2016-10-02 DIAGNOSIS — R5383 Other fatigue: Secondary | ICD-10-CM | POA: Diagnosis not present

## 2016-10-02 DIAGNOSIS — R201 Hypoesthesia of skin: Secondary | ICD-10-CM | POA: Diagnosis not present

## 2016-10-02 DIAGNOSIS — R2689 Other abnormalities of gait and mobility: Secondary | ICD-10-CM | POA: Diagnosis not present

## 2016-10-06 DIAGNOSIS — M6281 Muscle weakness (generalized): Secondary | ICD-10-CM | POA: Diagnosis not present

## 2016-10-06 DIAGNOSIS — R201 Hypoesthesia of skin: Secondary | ICD-10-CM | POA: Diagnosis not present

## 2016-10-06 DIAGNOSIS — R293 Abnormal posture: Secondary | ICD-10-CM | POA: Diagnosis not present

## 2016-10-06 DIAGNOSIS — R2689 Other abnormalities of gait and mobility: Secondary | ICD-10-CM | POA: Diagnosis not present

## 2016-10-06 DIAGNOSIS — R2681 Unsteadiness on feet: Secondary | ICD-10-CM | POA: Diagnosis not present

## 2016-10-06 DIAGNOSIS — R5383 Other fatigue: Secondary | ICD-10-CM | POA: Diagnosis not present

## 2016-10-08 DIAGNOSIS — M6281 Muscle weakness (generalized): Secondary | ICD-10-CM | POA: Diagnosis not present

## 2016-10-08 DIAGNOSIS — R2689 Other abnormalities of gait and mobility: Secondary | ICD-10-CM | POA: Diagnosis not present

## 2016-10-08 DIAGNOSIS — R293 Abnormal posture: Secondary | ICD-10-CM | POA: Diagnosis not present

## 2016-10-08 DIAGNOSIS — R5383 Other fatigue: Secondary | ICD-10-CM | POA: Diagnosis not present

## 2016-10-08 DIAGNOSIS — R2681 Unsteadiness on feet: Secondary | ICD-10-CM | POA: Diagnosis not present

## 2016-10-08 DIAGNOSIS — R201 Hypoesthesia of skin: Secondary | ICD-10-CM | POA: Diagnosis not present

## 2016-10-13 DIAGNOSIS — E291 Testicular hypofunction: Secondary | ICD-10-CM | POA: Diagnosis not present

## 2016-10-13 DIAGNOSIS — E538 Deficiency of other specified B group vitamins: Secondary | ICD-10-CM | POA: Diagnosis not present

## 2016-10-14 DIAGNOSIS — R293 Abnormal posture: Secondary | ICD-10-CM | POA: Diagnosis not present

## 2016-10-14 DIAGNOSIS — R2689 Other abnormalities of gait and mobility: Secondary | ICD-10-CM | POA: Diagnosis not present

## 2016-10-14 DIAGNOSIS — M6281 Muscle weakness (generalized): Secondary | ICD-10-CM | POA: Diagnosis not present

## 2016-10-14 DIAGNOSIS — R5383 Other fatigue: Secondary | ICD-10-CM | POA: Diagnosis not present

## 2016-10-14 DIAGNOSIS — R201 Hypoesthesia of skin: Secondary | ICD-10-CM | POA: Diagnosis not present

## 2016-10-14 DIAGNOSIS — R2681 Unsteadiness on feet: Secondary | ICD-10-CM | POA: Diagnosis not present

## 2016-10-16 DIAGNOSIS — R2681 Unsteadiness on feet: Secondary | ICD-10-CM | POA: Diagnosis not present

## 2016-10-16 DIAGNOSIS — R293 Abnormal posture: Secondary | ICD-10-CM | POA: Diagnosis not present

## 2016-10-16 DIAGNOSIS — M6281 Muscle weakness (generalized): Secondary | ICD-10-CM | POA: Diagnosis not present

## 2016-10-16 DIAGNOSIS — R2689 Other abnormalities of gait and mobility: Secondary | ICD-10-CM | POA: Diagnosis not present

## 2016-10-16 DIAGNOSIS — R201 Hypoesthesia of skin: Secondary | ICD-10-CM | POA: Diagnosis not present

## 2016-10-16 DIAGNOSIS — R5383 Other fatigue: Secondary | ICD-10-CM | POA: Diagnosis not present

## 2016-10-19 DIAGNOSIS — R201 Hypoesthesia of skin: Secondary | ICD-10-CM | POA: Diagnosis not present

## 2016-10-19 DIAGNOSIS — R2681 Unsteadiness on feet: Secondary | ICD-10-CM | POA: Diagnosis not present

## 2016-10-19 DIAGNOSIS — R2689 Other abnormalities of gait and mobility: Secondary | ICD-10-CM | POA: Diagnosis not present

## 2016-10-19 DIAGNOSIS — R5383 Other fatigue: Secondary | ICD-10-CM | POA: Diagnosis not present

## 2016-10-19 DIAGNOSIS — M6281 Muscle weakness (generalized): Secondary | ICD-10-CM | POA: Diagnosis not present

## 2016-10-19 DIAGNOSIS — R293 Abnormal posture: Secondary | ICD-10-CM | POA: Diagnosis not present

## 2016-10-21 DIAGNOSIS — R293 Abnormal posture: Secondary | ICD-10-CM | POA: Diagnosis not present

## 2016-10-21 DIAGNOSIS — R5383 Other fatigue: Secondary | ICD-10-CM | POA: Diagnosis not present

## 2016-10-21 DIAGNOSIS — R201 Hypoesthesia of skin: Secondary | ICD-10-CM | POA: Diagnosis not present

## 2016-10-21 DIAGNOSIS — R2689 Other abnormalities of gait and mobility: Secondary | ICD-10-CM | POA: Diagnosis not present

## 2016-10-21 DIAGNOSIS — M6281 Muscle weakness (generalized): Secondary | ICD-10-CM | POA: Diagnosis not present

## 2016-10-21 DIAGNOSIS — R2681 Unsteadiness on feet: Secondary | ICD-10-CM | POA: Diagnosis not present

## 2016-10-28 DIAGNOSIS — M6281 Muscle weakness (generalized): Secondary | ICD-10-CM | POA: Diagnosis not present

## 2016-10-28 DIAGNOSIS — R293 Abnormal posture: Secondary | ICD-10-CM | POA: Diagnosis not present

## 2016-10-28 DIAGNOSIS — R201 Hypoesthesia of skin: Secondary | ICD-10-CM | POA: Diagnosis not present

## 2016-10-28 DIAGNOSIS — R262 Difficulty in walking, not elsewhere classified: Secondary | ICD-10-CM | POA: Diagnosis not present

## 2016-10-28 DIAGNOSIS — R2681 Unsteadiness on feet: Secondary | ICD-10-CM | POA: Diagnosis not present

## 2016-10-28 DIAGNOSIS — R2689 Other abnormalities of gait and mobility: Secondary | ICD-10-CM | POA: Diagnosis not present

## 2016-10-29 DIAGNOSIS — E1142 Type 2 diabetes mellitus with diabetic polyneuropathy: Secondary | ICD-10-CM | POA: Diagnosis not present

## 2016-10-29 DIAGNOSIS — Z23 Encounter for immunization: Secondary | ICD-10-CM | POA: Diagnosis not present

## 2016-10-29 DIAGNOSIS — E039 Hypothyroidism, unspecified: Secondary | ICD-10-CM | POA: Diagnosis not present

## 2016-10-29 DIAGNOSIS — E559 Vitamin D deficiency, unspecified: Secondary | ICD-10-CM | POA: Diagnosis not present

## 2016-10-29 DIAGNOSIS — E291 Testicular hypofunction: Secondary | ICD-10-CM | POA: Diagnosis not present

## 2016-10-29 DIAGNOSIS — Z5181 Encounter for therapeutic drug level monitoring: Secondary | ICD-10-CM | POA: Diagnosis not present

## 2016-10-29 DIAGNOSIS — E538 Deficiency of other specified B group vitamins: Secondary | ICD-10-CM | POA: Diagnosis not present

## 2016-11-02 DIAGNOSIS — R201 Hypoesthesia of skin: Secondary | ICD-10-CM | POA: Diagnosis not present

## 2016-11-02 DIAGNOSIS — R262 Difficulty in walking, not elsewhere classified: Secondary | ICD-10-CM | POA: Diagnosis not present

## 2016-11-02 DIAGNOSIS — R293 Abnormal posture: Secondary | ICD-10-CM | POA: Diagnosis not present

## 2016-11-02 DIAGNOSIS — R2689 Other abnormalities of gait and mobility: Secondary | ICD-10-CM | POA: Diagnosis not present

## 2016-11-02 DIAGNOSIS — R2681 Unsteadiness on feet: Secondary | ICD-10-CM | POA: Diagnosis not present

## 2016-11-02 DIAGNOSIS — M6281 Muscle weakness (generalized): Secondary | ICD-10-CM | POA: Diagnosis not present

## 2016-11-04 DIAGNOSIS — R201 Hypoesthesia of skin: Secondary | ICD-10-CM | POA: Diagnosis not present

## 2016-11-04 DIAGNOSIS — R262 Difficulty in walking, not elsewhere classified: Secondary | ICD-10-CM | POA: Diagnosis not present

## 2016-11-04 DIAGNOSIS — R2681 Unsteadiness on feet: Secondary | ICD-10-CM | POA: Diagnosis not present

## 2016-11-04 DIAGNOSIS — R293 Abnormal posture: Secondary | ICD-10-CM | POA: Diagnosis not present

## 2016-11-04 DIAGNOSIS — R2689 Other abnormalities of gait and mobility: Secondary | ICD-10-CM | POA: Diagnosis not present

## 2016-11-04 DIAGNOSIS — M6281 Muscle weakness (generalized): Secondary | ICD-10-CM | POA: Diagnosis not present

## 2016-11-10 DIAGNOSIS — E538 Deficiency of other specified B group vitamins: Secondary | ICD-10-CM | POA: Diagnosis not present

## 2016-11-10 DIAGNOSIS — E291 Testicular hypofunction: Secondary | ICD-10-CM | POA: Diagnosis not present

## 2016-11-17 DIAGNOSIS — E785 Hyperlipidemia, unspecified: Secondary | ICD-10-CM | POA: Diagnosis not present

## 2016-11-17 DIAGNOSIS — I5032 Chronic diastolic (congestive) heart failure: Secondary | ICD-10-CM | POA: Diagnosis not present

## 2016-11-17 DIAGNOSIS — I2510 Atherosclerotic heart disease of native coronary artery without angina pectoris: Secondary | ICD-10-CM | POA: Diagnosis not present

## 2016-11-17 DIAGNOSIS — I481 Persistent atrial fibrillation: Secondary | ICD-10-CM | POA: Diagnosis not present

## 2016-11-18 ENCOUNTER — Ambulatory Visit: Payer: Medicare Other | Admitting: Sports Medicine

## 2016-11-20 ENCOUNTER — Ambulatory Visit: Payer: Medicare Other | Admitting: Sports Medicine

## 2016-11-20 DIAGNOSIS — M6281 Muscle weakness (generalized): Secondary | ICD-10-CM | POA: Diagnosis not present

## 2016-11-20 DIAGNOSIS — R2689 Other abnormalities of gait and mobility: Secondary | ICD-10-CM | POA: Diagnosis not present

## 2016-11-20 DIAGNOSIS — R201 Hypoesthesia of skin: Secondary | ICD-10-CM | POA: Diagnosis not present

## 2016-11-20 DIAGNOSIS — R293 Abnormal posture: Secondary | ICD-10-CM | POA: Diagnosis not present

## 2016-11-20 DIAGNOSIS — R2681 Unsteadiness on feet: Secondary | ICD-10-CM | POA: Diagnosis not present

## 2016-11-20 DIAGNOSIS — R262 Difficulty in walking, not elsewhere classified: Secondary | ICD-10-CM | POA: Diagnosis not present

## 2016-11-24 DIAGNOSIS — E291 Testicular hypofunction: Secondary | ICD-10-CM | POA: Diagnosis not present

## 2016-11-24 DIAGNOSIS — E538 Deficiency of other specified B group vitamins: Secondary | ICD-10-CM | POA: Diagnosis not present

## 2016-11-25 ENCOUNTER — Ambulatory Visit (INDEPENDENT_AMBULATORY_CARE_PROVIDER_SITE_OTHER): Payer: Medicare Other | Admitting: Sports Medicine

## 2016-11-25 DIAGNOSIS — M79676 Pain in unspecified toe(s): Secondary | ICD-10-CM | POA: Diagnosis not present

## 2016-11-25 DIAGNOSIS — B351 Tinea unguium: Secondary | ICD-10-CM

## 2016-11-25 DIAGNOSIS — E1140 Type 2 diabetes mellitus with diabetic neuropathy, unspecified: Secondary | ICD-10-CM

## 2016-11-25 DIAGNOSIS — S90415A Abrasion, left lesser toe(s), initial encounter: Secondary | ICD-10-CM

## 2016-11-25 NOTE — Progress Notes (Signed)
Patient ID: Alan Beltran, male   DOB: [birth year], 82 y.o.   MRN: [id number] Subjective: Alan Beltran is a 82 y.o. male patient with history of diabetes who returns to office today complaining of long, painful nails  while ambulating in shoes; unable to trim. Patient states that the glucose reading was "good", last A1c 3 weeks ago 6.8. Patient denies any new changes in medication or other new problems.  Patient Active Problem List   Diagnosis Date Noted  . Diabetes mellitus 09/04/2010   Current Outpatient Prescriptions on File Prior to Visit  Medication Sig Dispense Refill  . ALLOPURINOL PO Take by mouth.      . FOLIC ACID PO Take by mouth.      . Levothyroxine Sodium (SYNTHROID PO) Take by mouth.      . OMEPRAZOLE PO Take by mouth.      . Pioglitazone HCl (ACTOS PO) Take by mouth.      . Rosuvastatin Calcium (CRESTOR PO) Take by mouth.      . SERTRALINE HCL PO Take by mouth.      . SPIRONOLACTONE PO Take by mouth.      . TERAZOSIN HCL PO Take by mouth.      . TRUEPLUS LANCETS 33G MISC   5  . TRUETRACK TEST test strip   4  . WARFARIN SODIUM PO Take by mouth.      . Zinc Sulfate (ZINC 15 PO) Take by mouth.       No current facility-administered medications on file prior to visit.    No Known Allergies  No results found for this or any previous visit (from the past 2160 hour(s)).  Objective: General: Patient is awake, alert, and oriented x 3 and in no acute distress.  Integument: Skin is warm, dry and supple bilateral. Nails are tender, long, thickened and dystrophic with subungual debris, consistent with onychomycosis, 1-5 bilateral. Minor abrasion with bleeding at left 3rd toenail. No signs of infection. No open lesions or preulcerative lesions present bilateral. Remaining integument unremarkable.  Vasculature:  Dorsalis Pedis pulse 1/4 bilateral. Posterior Tibial pulse  1/4 bilateral. Capillary fill time <3 sec 1-5 bilateral. Diminished hair growth to the level of the  digits.Temperature gradient within normal limits. No varicosities present bilateral. No edema present bilateral.   Neurology: The patient has intact sensation measured with a 5.07/10g Semmes Weinstein Monofilament at all pedal sites bilateral . Vibratory sensation diminished bilateral with tuning fork. No Babinski sign present bilateral.   Musculoskeletal: Asymptomatic hammertoe pedal deformities noted bilateral. Muscular strength 5/5 in all lower extremity muscular groups bilateral without pain on range of motion. No tenderness with calf compression bilateral.  Assessment and Plan: Problem List Items Addressed This Visit    None    Visit Diagnoses    Pain due to onychomycosis of toenail    -  Primary   Type 2 diabetes, controlled, with neuropathy ([facility])       Abrasion of toe of left foot, initial encounter       3rd toe bleeding around nail, no infection      -Examined patient. -Discussed and educated patient on diabetic foot care, especially with  regards to the vascular, neurological and musculoskeletal systems.  -Stressed the importance of good glycemic control and the detriment of not  controlling glucose levels in relation to the foot. -Mechanically debrided all nails 1-5 bilateral using sterile nail nipper and filed with dremel without incident  -Recommend Band-Aid and antibiotic cream to left 3rd toe until  abrasion at nail heals  -Patient to return for diabetic shoe measurement. Gave patient certifying paperwork to have his doctor sign off on.  -Answered all patient questions -Patient to return in 2 weeks for shoe measurements and then in 3 months for at risk foot care/nail care -Patient advised to call the office if any problems or questions arise in the meantime.  Landis Martins, DPM

## 2016-11-26 DIAGNOSIS — M6281 Muscle weakness (generalized): Secondary | ICD-10-CM | POA: Diagnosis not present

## 2016-11-26 DIAGNOSIS — R2689 Other abnormalities of gait and mobility: Secondary | ICD-10-CM | POA: Diagnosis not present

## 2016-11-26 DIAGNOSIS — R5383 Other fatigue: Secondary | ICD-10-CM | POA: Diagnosis not present

## 2016-11-26 DIAGNOSIS — R2681 Unsteadiness on feet: Secondary | ICD-10-CM | POA: Diagnosis not present

## 2016-11-26 DIAGNOSIS — R293 Abnormal posture: Secondary | ICD-10-CM | POA: Diagnosis not present

## 2016-11-26 DIAGNOSIS — R262 Difficulty in walking, not elsewhere classified: Secondary | ICD-10-CM | POA: Diagnosis not present

## 2016-11-26 DIAGNOSIS — R201 Hypoesthesia of skin: Secondary | ICD-10-CM | POA: Diagnosis not present

## 2016-11-30 DIAGNOSIS — R5383 Other fatigue: Secondary | ICD-10-CM | POA: Diagnosis not present

## 2016-11-30 DIAGNOSIS — R201 Hypoesthesia of skin: Secondary | ICD-10-CM | POA: Diagnosis not present

## 2016-11-30 DIAGNOSIS — R262 Difficulty in walking, not elsewhere classified: Secondary | ICD-10-CM | POA: Diagnosis not present

## 2016-11-30 DIAGNOSIS — M6281 Muscle weakness (generalized): Secondary | ICD-10-CM | POA: Diagnosis not present

## 2016-11-30 DIAGNOSIS — R2681 Unsteadiness on feet: Secondary | ICD-10-CM | POA: Diagnosis not present

## 2016-11-30 DIAGNOSIS — R2689 Other abnormalities of gait and mobility: Secondary | ICD-10-CM | POA: Diagnosis not present

## 2016-12-03 DIAGNOSIS — R201 Hypoesthesia of skin: Secondary | ICD-10-CM | POA: Diagnosis not present

## 2016-12-03 DIAGNOSIS — R2689 Other abnormalities of gait and mobility: Secondary | ICD-10-CM | POA: Diagnosis not present

## 2016-12-03 DIAGNOSIS — R2681 Unsteadiness on feet: Secondary | ICD-10-CM | POA: Diagnosis not present

## 2016-12-03 DIAGNOSIS — M6281 Muscle weakness (generalized): Secondary | ICD-10-CM | POA: Diagnosis not present

## 2016-12-03 DIAGNOSIS — R262 Difficulty in walking, not elsewhere classified: Secondary | ICD-10-CM | POA: Diagnosis not present

## 2016-12-03 DIAGNOSIS — R5383 Other fatigue: Secondary | ICD-10-CM | POA: Diagnosis not present

## 2016-12-07 DIAGNOSIS — R262 Difficulty in walking, not elsewhere classified: Secondary | ICD-10-CM | POA: Diagnosis not present

## 2016-12-07 DIAGNOSIS — R2689 Other abnormalities of gait and mobility: Secondary | ICD-10-CM | POA: Diagnosis not present

## 2016-12-07 DIAGNOSIS — M6281 Muscle weakness (generalized): Secondary | ICD-10-CM | POA: Diagnosis not present

## 2016-12-07 DIAGNOSIS — R2681 Unsteadiness on feet: Secondary | ICD-10-CM | POA: Diagnosis not present

## 2016-12-07 DIAGNOSIS — R201 Hypoesthesia of skin: Secondary | ICD-10-CM | POA: Diagnosis not present

## 2016-12-07 DIAGNOSIS — R5383 Other fatigue: Secondary | ICD-10-CM | POA: Diagnosis not present

## 2016-12-08 DIAGNOSIS — E236 Other disorders of pituitary gland: Secondary | ICD-10-CM | POA: Diagnosis not present

## 2016-12-08 DIAGNOSIS — E538 Deficiency of other specified B group vitamins: Secondary | ICD-10-CM | POA: Diagnosis not present

## 2016-12-10 ENCOUNTER — Ambulatory Visit: Payer: Medicare Other | Admitting: *Deleted

## 2016-12-10 DIAGNOSIS — M6281 Muscle weakness (generalized): Secondary | ICD-10-CM | POA: Diagnosis not present

## 2016-12-10 DIAGNOSIS — R201 Hypoesthesia of skin: Secondary | ICD-10-CM | POA: Diagnosis not present

## 2016-12-10 DIAGNOSIS — E1140 Type 2 diabetes mellitus with diabetic neuropathy, unspecified: Secondary | ICD-10-CM

## 2016-12-10 DIAGNOSIS — R2689 Other abnormalities of gait and mobility: Secondary | ICD-10-CM | POA: Diagnosis not present

## 2016-12-10 DIAGNOSIS — M2042 Other hammer toe(s) (acquired), left foot: Secondary | ICD-10-CM

## 2016-12-10 DIAGNOSIS — R5383 Other fatigue: Secondary | ICD-10-CM | POA: Diagnosis not present

## 2016-12-10 DIAGNOSIS — R2681 Unsteadiness on feet: Secondary | ICD-10-CM | POA: Diagnosis not present

## 2016-12-10 DIAGNOSIS — M2041 Other hammer toe(s) (acquired), right foot: Secondary | ICD-10-CM

## 2016-12-10 DIAGNOSIS — Q828 Other specified congenital malformations of skin: Secondary | ICD-10-CM

## 2016-12-10 DIAGNOSIS — R262 Difficulty in walking, not elsewhere classified: Secondary | ICD-10-CM | POA: Diagnosis not present

## 2016-12-11 DIAGNOSIS — Z6838 Body mass index (BMI) 38.0-38.9, adult: Secondary | ICD-10-CM | POA: Diagnosis not present

## 2016-12-11 DIAGNOSIS — I5030 Unspecified diastolic (congestive) heart failure: Secondary | ICD-10-CM | POA: Diagnosis not present

## 2016-12-11 NOTE — Progress Notes (Signed)
Patient ID: Alan Beltran, male   DOB: 06-07-1934, 82 y.o.   MRN: [id number]   Patient presents to be measured for diabetic shoes and inserts with [employer] CP[employer].  Patient measures as 11 extra wide on the [place]annock device and chosen the [employer] tieless in [place].  We will call when shoes and inserts arrive

## 2016-12-14 DIAGNOSIS — R2689 Other abnormalities of gait and mobility: Secondary | ICD-10-CM | POA: Diagnosis not present

## 2016-12-14 DIAGNOSIS — M6281 Muscle weakness (generalized): Secondary | ICD-10-CM | POA: Diagnosis not present

## 2016-12-14 DIAGNOSIS — R5383 Other fatigue: Secondary | ICD-10-CM | POA: Diagnosis not present

## 2016-12-14 DIAGNOSIS — R201 Hypoesthesia of skin: Secondary | ICD-10-CM | POA: Diagnosis not present

## 2016-12-14 DIAGNOSIS — R2681 Unsteadiness on feet: Secondary | ICD-10-CM | POA: Diagnosis not present

## 2016-12-14 DIAGNOSIS — R262 Difficulty in walking, not elsewhere classified: Secondary | ICD-10-CM | POA: Diagnosis not present

## 2016-12-21 DIAGNOSIS — I5030 Unspecified diastolic (congestive) heart failure: Secondary | ICD-10-CM | POA: Diagnosis not present

## 2016-12-22 DIAGNOSIS — E538 Deficiency of other specified B group vitamins: Secondary | ICD-10-CM | POA: Diagnosis not present

## 2016-12-22 DIAGNOSIS — E236 Other disorders of pituitary gland: Secondary | ICD-10-CM | POA: Diagnosis not present

## 2016-12-23 DIAGNOSIS — R262 Difficulty in walking, not elsewhere classified: Secondary | ICD-10-CM | POA: Diagnosis not present

## 2016-12-23 DIAGNOSIS — M6281 Muscle weakness (generalized): Secondary | ICD-10-CM | POA: Diagnosis not present

## 2016-12-23 DIAGNOSIS — R201 Hypoesthesia of skin: Secondary | ICD-10-CM | POA: Diagnosis not present

## 2016-12-23 DIAGNOSIS — R2689 Other abnormalities of gait and mobility: Secondary | ICD-10-CM | POA: Diagnosis not present

## 2016-12-23 DIAGNOSIS — R5383 Other fatigue: Secondary | ICD-10-CM | POA: Diagnosis not present

## 2016-12-23 DIAGNOSIS — R2681 Unsteadiness on feet: Secondary | ICD-10-CM | POA: Diagnosis not present

## 2016-12-24 DIAGNOSIS — I481 Persistent atrial fibrillation: Secondary | ICD-10-CM | POA: Diagnosis not present

## 2016-12-24 DIAGNOSIS — I10 Essential (primary) hypertension: Secondary | ICD-10-CM | POA: Diagnosis not present

## 2016-12-24 DIAGNOSIS — I5032 Chronic diastolic (congestive) heart failure: Secondary | ICD-10-CM | POA: Diagnosis not present

## 2016-12-28 DIAGNOSIS — I5032 Chronic diastolic (congestive) heart failure: Secondary | ICD-10-CM | POA: Diagnosis not present

## 2016-12-28 DIAGNOSIS — I4891 Unspecified atrial fibrillation: Secondary | ICD-10-CM | POA: Diagnosis not present

## 2016-12-28 DIAGNOSIS — J209 Acute bronchitis, unspecified: Secondary | ICD-10-CM | POA: Diagnosis not present

## 2016-12-28 DIAGNOSIS — I517 Cardiomegaly: Secondary | ICD-10-CM | POA: Diagnosis not present

## 2017-01-06 DIAGNOSIS — E538 Deficiency of other specified B group vitamins: Secondary | ICD-10-CM | POA: Diagnosis not present

## 2017-01-06 DIAGNOSIS — E291 Testicular hypofunction: Secondary | ICD-10-CM | POA: Diagnosis not present

## 2017-01-12 DIAGNOSIS — I10 Essential (primary) hypertension: Secondary | ICD-10-CM | POA: Diagnosis not present

## 2017-01-14 ENCOUNTER — Other Ambulatory Visit: Payer: Medicare Other | Admitting: *Deleted

## 2017-01-22 DIAGNOSIS — E538 Deficiency of other specified B group vitamins: Secondary | ICD-10-CM | POA: Diagnosis not present

## 2017-01-22 DIAGNOSIS — H7412 Adhesive left middle ear disease: Secondary | ICD-10-CM | POA: Diagnosis not present

## 2017-01-22 DIAGNOSIS — E291 Testicular hypofunction: Secondary | ICD-10-CM | POA: Diagnosis not present

## 2017-01-22 DIAGNOSIS — H6983 Other specified disorders of Eustachian tube, bilateral: Secondary | ICD-10-CM | POA: Diagnosis not present

## 2017-01-22 DIAGNOSIS — H906 Mixed conductive and sensorineural hearing loss, bilateral: Secondary | ICD-10-CM | POA: Diagnosis not present

## 2017-01-29 DIAGNOSIS — R0602 Shortness of breath: Secondary | ICD-10-CM | POA: Diagnosis not present

## 2017-01-29 DIAGNOSIS — I10 Essential (primary) hypertension: Secondary | ICD-10-CM | POA: Diagnosis not present

## 2017-01-29 DIAGNOSIS — J45909 Unspecified asthma, uncomplicated: Secondary | ICD-10-CM | POA: Diagnosis not present

## 2017-02-02 DIAGNOSIS — H906 Mixed conductive and sensorineural hearing loss, bilateral: Secondary | ICD-10-CM | POA: Diagnosis not present

## 2017-02-02 DIAGNOSIS — E220 Acromegaly and pituitary gigantism: Secondary | ICD-10-CM | POA: Diagnosis not present

## 2017-02-02 DIAGNOSIS — E119 Type 2 diabetes mellitus without complications: Secondary | ICD-10-CM | POA: Diagnosis not present

## 2017-02-02 DIAGNOSIS — E538 Deficiency of other specified B group vitamins: Secondary | ICD-10-CM | POA: Diagnosis not present

## 2017-02-02 DIAGNOSIS — H6983 Other specified disorders of Eustachian tube, bilateral: Secondary | ICD-10-CM | POA: Diagnosis not present

## 2017-02-02 DIAGNOSIS — E291 Testicular hypofunction: Secondary | ICD-10-CM | POA: Diagnosis not present

## 2017-02-02 DIAGNOSIS — H7412 Adhesive left middle ear disease: Secondary | ICD-10-CM | POA: Diagnosis not present

## 2017-02-05 DIAGNOSIS — I481 Persistent atrial fibrillation: Secondary | ICD-10-CM | POA: Diagnosis not present

## 2017-02-05 DIAGNOSIS — I5032 Chronic diastolic (congestive) heart failure: Secondary | ICD-10-CM | POA: Diagnosis not present

## 2017-02-05 DIAGNOSIS — I10 Essential (primary) hypertension: Secondary | ICD-10-CM | POA: Diagnosis not present

## 2017-02-11 DIAGNOSIS — I10 Essential (primary) hypertension: Secondary | ICD-10-CM | POA: Diagnosis not present

## 2017-02-17 DIAGNOSIS — E538 Deficiency of other specified B group vitamins: Secondary | ICD-10-CM | POA: Diagnosis not present

## 2017-02-17 DIAGNOSIS — E291 Testicular hypofunction: Secondary | ICD-10-CM | POA: Diagnosis not present

## 2017-02-25 ENCOUNTER — Ambulatory Visit: Payer: Medicare Other | Admitting: Sports Medicine

## 2017-03-02 DIAGNOSIS — E538 Deficiency of other specified B group vitamins: Secondary | ICD-10-CM | POA: Diagnosis not present

## 2017-03-02 DIAGNOSIS — E291 Testicular hypofunction: Secondary | ICD-10-CM | POA: Diagnosis not present

## 2017-03-03 DIAGNOSIS — I10 Essential (primary) hypertension: Secondary | ICD-10-CM | POA: Diagnosis not present

## 2017-03-05 ENCOUNTER — Encounter: Payer: Self-pay | Admitting: Sports Medicine

## 2017-03-05 ENCOUNTER — Ambulatory Visit (INDEPENDENT_AMBULATORY_CARE_PROVIDER_SITE_OTHER): Payer: Medicare Other | Admitting: Sports Medicine

## 2017-03-05 DIAGNOSIS — B351 Tinea unguium: Secondary | ICD-10-CM | POA: Diagnosis not present

## 2017-03-05 DIAGNOSIS — E1140 Type 2 diabetes mellitus with diabetic neuropathy, unspecified: Secondary | ICD-10-CM

## 2017-03-05 DIAGNOSIS — M79676 Pain in unspecified toe(s): Secondary | ICD-10-CM

## 2017-03-05 NOTE — Progress Notes (Signed)
Patient ID: Alan Beltran, male   DOB: [birth year], 83 y.o.   MRN: [id number] Subjective: Terion Hedman is a 83 y.o. male patient with history of diabetes who returns to office today complaining of long, painful nails  while ambulating in shoes; unable to trim. Patient states that the glucose reading was not checked saw his primary care doctor 2 months ago.  Patient denies any new changes in medication or other new problems.  Patient Active Problem List   Diagnosis Date Noted  . Diabetes mellitus 09/04/2010   Current Outpatient Medications on File Prior to Visit  Medication Sig Dispense Refill  . ALLOPURINOL PO Take by mouth.      . FOLIC ACID PO Take by mouth.      . Levothyroxine Sodium (SYNTHROID PO) Take by mouth.      . OMEPRAZOLE PO Take by mouth.      . Pioglitazone HCl (ACTOS PO) Take by mouth.      . Rosuvastatin Calcium (CRESTOR PO) Take by mouth.      . SERTRALINE HCL PO Take by mouth.      . SPIRONOLACTONE PO Take by mouth.      . TERAZOSIN HCL PO Take by mouth.      . TRUEPLUS LANCETS 33G MISC   5  . TRUETRACK TEST test strip   4  . WARFARIN SODIUM PO Take by mouth.      . Zinc Sulfate (ZINC 15 PO) Take by mouth.       No current facility-administered medications on file prior to visit.    No Known Allergies  No results found for this or any previous visit (from the past 2160 hour(s)).  Objective: General: Patient is awake, alert, and oriented x 3 and in no acute distress.  Integument: Skin is warm, dry and supple bilateral. Nails are tender, long, thickened and dystrophic with subungual debris, consistent with onychomycosis, 1-5 bilateral. No signs of infection. No open lesions or preulcerative lesions present bilateral. Remaining integument unremarkable.  Vasculature:  Dorsalis Pedis pulse 1/4 bilateral. Posterior Tibial pulse  1/4 bilateral. Capillary fill time <3 sec 1-5 bilateral. Diminished hair growth to the level of the digits.Temperature gradient within normal  limits. No varicosities present bilateral. No edema present bilateral.   Neurology: The patient has intact sensation measured with a 5.07/10g Semmes Weinstein Monofilament at all pedal sites bilateral. Vibratory sensation diminished bilateral with tuning fork. No Babinski sign present bilateral.   Musculoskeletal: Asymptomatic hammertoe pedal deformities noted bilateral. Muscular strength 5/5 in all lower extremity muscular groups bilateral without pain on range of motion. No tenderness with calf compression bilateral.  Assessment and Plan: Problem List Items Addressed This Visit    None    Visit Diagnoses    Pain due to onychomycosis of toenail    -  Primary   Type 2 diabetes, controlled, with neuropathy ([place])         -Examined patient. -Discussed and educated patient on diabetic foot care, especially with  regards to the vascular, neurological and musculoskeletal systems.  -Stressed the importance of good glycemic control and the detriment of not  controlling glucose levels in relation to the foot. -Mechanically debrided all nails 1-5 bilateral using sterile nail nipper and filed with dremel without incident  -Answered all patient questions -Patient to return in 3 months for at risk foot/diabetic nail care -Patient advised to call the office if any problems or questions arise in the meantime.  Landis Martins, DPM

## 2017-03-16 DIAGNOSIS — E538 Deficiency of other specified B group vitamins: Secondary | ICD-10-CM | POA: Diagnosis not present

## 2017-03-16 DIAGNOSIS — E291 Testicular hypofunction: Secondary | ICD-10-CM | POA: Diagnosis not present

## 2017-03-24 DIAGNOSIS — I10 Essential (primary) hypertension: Secondary | ICD-10-CM | POA: Diagnosis not present

## 2017-03-24 DIAGNOSIS — I5032 Chronic diastolic (congestive) heart failure: Secondary | ICD-10-CM | POA: Diagnosis not present

## 2017-03-30 DIAGNOSIS — E291 Testicular hypofunction: Secondary | ICD-10-CM | POA: Diagnosis not present

## 2017-03-30 DIAGNOSIS — E538 Deficiency of other specified B group vitamins: Secondary | ICD-10-CM | POA: Diagnosis not present

## 2017-04-01 DIAGNOSIS — G629 Polyneuropathy, unspecified: Secondary | ICD-10-CM | POA: Diagnosis not present

## 2017-04-01 DIAGNOSIS — I639 Cerebral infarction, unspecified: Secondary | ICD-10-CM | POA: Diagnosis not present

## 2017-04-05 DIAGNOSIS — H6532 Chronic mucoid otitis media, left ear: Secondary | ICD-10-CM | POA: Diagnosis not present

## 2017-04-05 DIAGNOSIS — H6983 Other specified disorders of Eustachian tube, bilateral: Secondary | ICD-10-CM | POA: Diagnosis not present

## 2017-04-05 DIAGNOSIS — E220 Acromegaly and pituitary gigantism: Secondary | ICD-10-CM | POA: Diagnosis not present

## 2017-04-05 DIAGNOSIS — H906 Mixed conductive and sensorineural hearing loss, bilateral: Secondary | ICD-10-CM | POA: Diagnosis not present

## 2017-04-05 DIAGNOSIS — E119 Type 2 diabetes mellitus without complications: Secondary | ICD-10-CM | POA: Diagnosis not present

## 2017-04-05 DIAGNOSIS — H7412 Adhesive left middle ear disease: Secondary | ICD-10-CM | POA: Diagnosis not present

## 2017-04-06 DIAGNOSIS — H6532 Chronic mucoid otitis media, left ear: Secondary | ICD-10-CM | POA: Diagnosis not present

## 2017-04-06 DIAGNOSIS — E220 Acromegaly and pituitary gigantism: Secondary | ICD-10-CM | POA: Diagnosis not present

## 2017-04-06 DIAGNOSIS — H906 Mixed conductive and sensorineural hearing loss, bilateral: Secondary | ICD-10-CM | POA: Diagnosis not present

## 2017-04-06 DIAGNOSIS — E119 Type 2 diabetes mellitus without complications: Secondary | ICD-10-CM | POA: Diagnosis not present

## 2017-04-06 DIAGNOSIS — H6983 Other specified disorders of Eustachian tube, bilateral: Secondary | ICD-10-CM | POA: Diagnosis not present

## 2017-04-06 DIAGNOSIS — H7412 Adhesive left middle ear disease: Secondary | ICD-10-CM | POA: Diagnosis not present

## 2017-04-07 DIAGNOSIS — I509 Heart failure, unspecified: Secondary | ICD-10-CM | POA: Diagnosis not present

## 2017-04-07 DIAGNOSIS — I4891 Unspecified atrial fibrillation: Secondary | ICD-10-CM | POA: Diagnosis not present

## 2017-04-07 DIAGNOSIS — I10 Essential (primary) hypertension: Secondary | ICD-10-CM | POA: Diagnosis not present

## 2017-04-13 DIAGNOSIS — E291 Testicular hypofunction: Secondary | ICD-10-CM | POA: Diagnosis not present

## 2017-04-13 DIAGNOSIS — E538 Deficiency of other specified B group vitamins: Secondary | ICD-10-CM | POA: Diagnosis not present

## 2017-04-14 DIAGNOSIS — I4891 Unspecified atrial fibrillation: Secondary | ICD-10-CM | POA: Diagnosis not present

## 2017-04-27 DIAGNOSIS — E291 Testicular hypofunction: Secondary | ICD-10-CM | POA: Diagnosis not present

## 2017-04-27 DIAGNOSIS — E538 Deficiency of other specified B group vitamins: Secondary | ICD-10-CM | POA: Diagnosis not present

## 2017-05-11 DIAGNOSIS — E291 Testicular hypofunction: Secondary | ICD-10-CM | POA: Diagnosis not present

## 2017-05-11 DIAGNOSIS — E538 Deficiency of other specified B group vitamins: Secondary | ICD-10-CM | POA: Diagnosis not present

## 2017-05-18 DIAGNOSIS — E220 Acromegaly and pituitary gigantism: Secondary | ICD-10-CM | POA: Diagnosis not present

## 2017-05-18 DIAGNOSIS — H6983 Other specified disorders of Eustachian tube, bilateral: Secondary | ICD-10-CM | POA: Diagnosis not present

## 2017-05-18 DIAGNOSIS — H7412 Adhesive left middle ear disease: Secondary | ICD-10-CM | POA: Diagnosis not present

## 2017-05-18 DIAGNOSIS — H906 Mixed conductive and sensorineural hearing loss, bilateral: Secondary | ICD-10-CM | POA: Diagnosis not present

## 2017-05-18 DIAGNOSIS — E119 Type 2 diabetes mellitus without complications: Secondary | ICD-10-CM | POA: Diagnosis not present

## 2017-05-25 DIAGNOSIS — E538 Deficiency of other specified B group vitamins: Secondary | ICD-10-CM | POA: Diagnosis not present

## 2017-05-25 DIAGNOSIS — E291 Testicular hypofunction: Secondary | ICD-10-CM | POA: Diagnosis not present

## 2017-06-04 ENCOUNTER — Ambulatory Visit: Payer: Medicare Other | Admitting: Sports Medicine

## 2017-06-09 DIAGNOSIS — E538 Deficiency of other specified B group vitamins: Secondary | ICD-10-CM | POA: Diagnosis not present

## 2017-06-09 DIAGNOSIS — E291 Testicular hypofunction: Secondary | ICD-10-CM | POA: Diagnosis not present

## 2017-06-22 DIAGNOSIS — E291 Testicular hypofunction: Secondary | ICD-10-CM | POA: Diagnosis not present

## 2017-06-22 DIAGNOSIS — E538 Deficiency of other specified B group vitamins: Secondary | ICD-10-CM | POA: Diagnosis not present

## 2017-06-23 ENCOUNTER — Ambulatory Visit (INDEPENDENT_AMBULATORY_CARE_PROVIDER_SITE_OTHER): Payer: Medicare Other | Admitting: Sports Medicine

## 2017-06-23 ENCOUNTER — Encounter: Payer: Self-pay | Admitting: Sports Medicine

## 2017-06-23 DIAGNOSIS — M79676 Pain in unspecified toe(s): Secondary | ICD-10-CM | POA: Diagnosis not present

## 2017-06-23 DIAGNOSIS — E1140 Type 2 diabetes mellitus with diabetic neuropathy, unspecified: Secondary | ICD-10-CM

## 2017-06-23 DIAGNOSIS — B351 Tinea unguium: Secondary | ICD-10-CM

## 2017-06-23 NOTE — Progress Notes (Signed)
Patient ID: Alan Beltran, male   DOB: 06-13-1934, 83 y.o.   MRN: [id number] Subjective: Alan Beltran is a 83 y.o. male patient with history of diabetes who returns to office today complaining of long, painful nails  while ambulating in shoes; unable to trim. Patient states that the glucose reading was checked a couple days ago and was 124, last A1c 7 and was seen last by his primary care doctor Dr. Venetia Maxon 3 months ago.  Patient denies any new changes in medication or other new problems since last visit.  Patient Active Problem List   Diagnosis Date Noted  . Diabetes mellitus 09/04/2010   Current Outpatient Medications on File Prior to Visit  Medication Sig Dispense Refill  . ALLOPURINOL PO Take by mouth.      . FOLIC ACID PO Take by mouth.      . Levothyroxine Sodium (SYNTHROID PO) Take by mouth.      . OMEPRAZOLE PO Take by mouth.      . Pioglitazone HCl (ACTOS PO) Take by mouth.      . Rosuvastatin Calcium (CRESTOR PO) Take by mouth.      . SERTRALINE HCL PO Take by mouth.      . SPIRONOLACTONE PO Take by mouth.      . TERAZOSIN HCL PO Take by mouth.      . TRUEPLUS LANCETS 33G MISC   5  . TRUETRACK TEST test strip   4  . WARFARIN SODIUM PO Take by mouth.      . Zinc Sulfate (ZINC 15 PO) Take by mouth.       No current facility-administered medications on file prior to visit.    No Known Allergies  No results found for this or any previous visit (from the past 2160 hour(s)).  Objective: General: Patient is awake, alert, and oriented x 3 and in no acute distress.  Integument: Skin is warm, dry and supple bilateral. Nails are tender, long, thickened and dystrophic with subungual debris, consistent with onychomycosis, 1-5 bilateral. No signs of infection. No open lesions or preulcerative lesions present bilateral. Remaining integument unremarkable.  Vasculature:  Dorsalis Pedis pulse 1/4 bilateral. Posterior Tibial pulse  1/4 bilateral. Capillary fill time <3 sec 1-5 bilateral.  Diminished hair growth to the level of the digits.Temperature gradient within normal limits. No varicosities present bilateral.  Trace chronic  edema present bilateral.   Neurology: The patient has intact sensation measured with a 5.07/10g Semmes Weinstein Monofilament at all pedal sites bilateral. Vibratory sensation diminished bilateral with tuning fork. No Babinski sign present bilateral.   Musculoskeletal: Asymptomatic hammertoe pedal deformities noted bilateral. Muscular strength 5/5 in all lower extremity muscular groups bilateral without pain on range of motion. No tenderness with calf compression bilateral.  Assessment and Plan: Problem List Items Addressed This Visit    None    Visit Diagnoses    Pain due to onychomycosis of toenail    -  Primary   Type 2 diabetes, controlled, with neuropathy ([place])         -Examined patient. -Discussed and educated patient on diabetic foot care, especially with  regards to the vascular, neurological and musculoskeletal systems.  -Stressed the importance of good glycemic control and the detriment of not  controlling glucose levels in relation to the foot. -Mechanically debrided all nails 1-5 bilateral using sterile nail nipper and filed with dremel without incident  -Patient to return in 3 months for at risk foot/diabetic nail care -Patient advised to call the office  if any problems or questions arise in the meantime.  Alan Beltran, DPM

## 2017-07-06 DIAGNOSIS — E291 Testicular hypofunction: Secondary | ICD-10-CM | POA: Diagnosis not present

## 2017-07-06 DIAGNOSIS — E538 Deficiency of other specified B group vitamins: Secondary | ICD-10-CM | POA: Diagnosis not present

## 2017-07-22 DIAGNOSIS — E538 Deficiency of other specified B group vitamins: Secondary | ICD-10-CM | POA: Diagnosis not present

## 2017-07-22 DIAGNOSIS — E291 Testicular hypofunction: Secondary | ICD-10-CM | POA: Diagnosis not present

## 2017-08-04 DIAGNOSIS — E538 Deficiency of other specified B group vitamins: Secondary | ICD-10-CM | POA: Diagnosis not present

## 2017-08-04 DIAGNOSIS — E291 Testicular hypofunction: Secondary | ICD-10-CM | POA: Diagnosis not present

## 2017-08-09 DIAGNOSIS — L821 Other seborrheic keratosis: Secondary | ICD-10-CM | POA: Diagnosis not present

## 2017-08-09 DIAGNOSIS — L578 Other skin changes due to chronic exposure to nonionizing radiation: Secondary | ICD-10-CM | POA: Diagnosis not present

## 2017-08-09 DIAGNOSIS — L820 Inflamed seborrheic keratosis: Secondary | ICD-10-CM | POA: Diagnosis not present

## 2017-08-17 DIAGNOSIS — E291 Testicular hypofunction: Secondary | ICD-10-CM | POA: Diagnosis not present

## 2017-08-17 DIAGNOSIS — E538 Deficiency of other specified B group vitamins: Secondary | ICD-10-CM | POA: Diagnosis not present

## 2017-08-27 DIAGNOSIS — L905 Scar conditions and fibrosis of skin: Secondary | ICD-10-CM | POA: Diagnosis not present

## 2017-08-27 DIAGNOSIS — L821 Other seborrheic keratosis: Secondary | ICD-10-CM | POA: Diagnosis not present

## 2017-08-27 DIAGNOSIS — L218 Other seborrheic dermatitis: Secondary | ICD-10-CM | POA: Diagnosis not present

## 2017-08-27 DIAGNOSIS — L570 Actinic keratosis: Secondary | ICD-10-CM | POA: Diagnosis not present

## 2017-08-31 DIAGNOSIS — E291 Testicular hypofunction: Secondary | ICD-10-CM | POA: Diagnosis not present

## 2017-08-31 DIAGNOSIS — E538 Deficiency of other specified B group vitamins: Secondary | ICD-10-CM | POA: Diagnosis not present

## 2017-09-02 DIAGNOSIS — Z0000 Encounter for general adult medical examination without abnormal findings: Secondary | ICD-10-CM | POA: Diagnosis not present

## 2017-09-02 DIAGNOSIS — Z125 Encounter for screening for malignant neoplasm of prostate: Secondary | ICD-10-CM | POA: Diagnosis not present

## 2017-09-02 DIAGNOSIS — I10 Essential (primary) hypertension: Secondary | ICD-10-CM | POA: Diagnosis not present

## 2017-09-02 DIAGNOSIS — N400 Enlarged prostate without lower urinary tract symptoms: Secondary | ICD-10-CM | POA: Diagnosis not present

## 2017-09-02 DIAGNOSIS — E1159 Type 2 diabetes mellitus with other circulatory complications: Secondary | ICD-10-CM | POA: Diagnosis not present

## 2017-09-02 DIAGNOSIS — E039 Hypothyroidism, unspecified: Secondary | ICD-10-CM | POA: Diagnosis not present

## 2017-09-02 DIAGNOSIS — E785 Hyperlipidemia, unspecified: Secondary | ICD-10-CM | POA: Diagnosis not present

## 2017-09-02 DIAGNOSIS — Z79899 Other long term (current) drug therapy: Secondary | ICD-10-CM | POA: Diagnosis not present

## 2017-09-06 DIAGNOSIS — G4733 Obstructive sleep apnea (adult) (pediatric): Secondary | ICD-10-CM | POA: Diagnosis not present

## 2017-09-06 DIAGNOSIS — J45909 Unspecified asthma, uncomplicated: Secondary | ICD-10-CM | POA: Diagnosis not present

## 2017-09-14 DIAGNOSIS — E291 Testicular hypofunction: Secondary | ICD-10-CM | POA: Diagnosis not present

## 2017-09-14 DIAGNOSIS — E538 Deficiency of other specified B group vitamins: Secondary | ICD-10-CM | POA: Diagnosis not present

## 2017-09-23 ENCOUNTER — Ambulatory Visit: Payer: Medicare Other | Admitting: Sports Medicine

## 2017-09-23 DIAGNOSIS — E1140 Type 2 diabetes mellitus with diabetic neuropathy, unspecified: Secondary | ICD-10-CM | POA: Diagnosis not present

## 2017-09-23 DIAGNOSIS — Z719 Counseling, unspecified: Secondary | ICD-10-CM | POA: Diagnosis not present

## 2017-09-28 DIAGNOSIS — E291 Testicular hypofunction: Secondary | ICD-10-CM | POA: Diagnosis not present

## 2017-09-28 DIAGNOSIS — E538 Deficiency of other specified B group vitamins: Secondary | ICD-10-CM | POA: Diagnosis not present

## 2017-10-04 DIAGNOSIS — I4891 Unspecified atrial fibrillation: Secondary | ICD-10-CM | POA: Diagnosis not present

## 2017-10-04 DIAGNOSIS — I2510 Atherosclerotic heart disease of native coronary artery without angina pectoris: Secondary | ICD-10-CM | POA: Diagnosis not present

## 2017-10-04 DIAGNOSIS — I5032 Chronic diastolic (congestive) heart failure: Secondary | ICD-10-CM | POA: Diagnosis not present

## 2017-10-04 DIAGNOSIS — E785 Hyperlipidemia, unspecified: Secondary | ICD-10-CM | POA: Diagnosis not present

## 2017-10-06 ENCOUNTER — Encounter: Payer: Self-pay | Admitting: Sports Medicine

## 2017-10-06 ENCOUNTER — Ambulatory Visit (INDEPENDENT_AMBULATORY_CARE_PROVIDER_SITE_OTHER): Payer: Medicare Other | Admitting: Sports Medicine

## 2017-10-06 VITALS — BP 113/62 | HR 92 | Resp 15

## 2017-10-06 DIAGNOSIS — B351 Tinea unguium: Secondary | ICD-10-CM | POA: Diagnosis not present

## 2017-10-06 DIAGNOSIS — S90412A Abrasion, left great toe, initial encounter: Secondary | ICD-10-CM

## 2017-10-06 DIAGNOSIS — M79676 Pain in unspecified toe(s): Secondary | ICD-10-CM | POA: Diagnosis not present

## 2017-10-06 DIAGNOSIS — E1140 Type 2 diabetes mellitus with diabetic neuropathy, unspecified: Secondary | ICD-10-CM

## 2017-10-06 NOTE — Progress Notes (Signed)
Patient ID: Alan Beltran, male   DOB: February 19, 1934, 83 y.o.   MRN: [id number] Subjective: Alan Beltran is a 83 y.o. male patient with history of diabetes who returns to office today complaining of long, painful nails  while ambulating in shoes; unable to trim. Patient states that the glucose reading was checked a couple days ago and was 153, last A1c 12.3 and was seen last by his primary care doctor Dr. Venetia Maxon 2 weeks ago.  Admits that he scraped his left toe getting out of the shower and that his wife put antibiotic cream on it and that it is doing fine with no pain or discomfort.  Patient denies any other new changes in medication or other new problems since last visit.  Patient Active Problem List   Diagnosis Date Noted  . Diabetes mellitus 09/04/2010   Current Outpatient Medications on File Prior to Visit  Medication Sig Dispense Refill  . ALLOPURINOL PO Take by mouth.      . FOLIC ACID PO Take by mouth.      . Levothyroxine Sodium (SYNTHROID PO) Take by mouth.      . OMEPRAZOLE PO Take by mouth.      . Pioglitazone HCl (ACTOS PO) Take by mouth.      . Rosuvastatin Calcium (CRESTOR PO) Take by mouth.      . SERTRALINE HCL PO Take by mouth.      . SPIRONOLACTONE PO Take by mouth.      . TERAZOSIN HCL PO Take by mouth.      . TRUEPLUS LANCETS 33G MISC   5  . TRUETRACK TEST test strip   4  . WARFARIN SODIUM PO Take by mouth.      . Zinc Sulfate (ZINC 15 PO) Take by mouth.       No current facility-administered medications on file prior to visit.    No Known Allergies  No results found for this or any previous visit (from the past 2160 hour(s)).  Objective: General: Patient is awake, alert, and oriented x 3 and in no acute distress.  Integument: Skin is warm, dry and supple bilateral. Nails are tender, long, thickened and dystrophic with subungual debris, consistent with onychomycosis, 1-5 bilateral.  Abrasion left great toe with no signs of infection. No open lesions or preulcerative  lesions present bilateral. Remaining integument unremarkable.  Vasculature:  Dorsalis Pedis pulse 1/4 bilateral. Posterior Tibial pulse  1/4 bilateral. Capillary fill time <3 sec 1-5 bilateral. Diminished hair growth to the level of the digits.Temperature gradient within normal limits. No varicosities present bilateral.  Trace chronic  edema present bilateral.   Neurology: The patient has intact sensation measured with a 5.07/10g Semmes Weinstein Monofilament at all pedal sites bilateral. Vibratory sensation diminished bilateral with tuning fork. No Babinski sign present bilateral.   Musculoskeletal: Asymptomatic hammertoe pedal deformities noted bilateral. Muscular strength 5/5 in all lower extremity muscular groups bilateral without pain on range of motion. No tenderness with calf compression bilateral.  Assessment and Plan: Problem List Items Addressed This Visit    None    Visit Diagnoses    Pain due to onychomycosis of toenail    -  Primary   Type 2 diabetes, controlled, with neuropathy ([facility])       Abrasion of left great toe, initial encounter         -Examined patient. -Discussed and educated patient on diabetic foot care, especially with  regards to the vascular, neurological and musculoskeletal systems.  -Applied antibiotic cream  to abrasion at left great toe and advised patient if fails to improve return to office within 2 weeks otherwise if continues to get better may return as scheduled for routine diabetic foot care -Mechanically debrided all nails 1-5 bilateral using sterile nail nipper and filed with dremel without incident  -Patient to return in 3 months for at risk foot/diabetic nail care -Patient advised to call the office if any problems or questions arise in the meantime.  Landis Martins, DPM

## 2017-10-14 DIAGNOSIS — Z23 Encounter for immunization: Secondary | ICD-10-CM | POA: Diagnosis not present

## 2017-10-14 DIAGNOSIS — E538 Deficiency of other specified B group vitamins: Secondary | ICD-10-CM | POA: Diagnosis not present

## 2017-10-14 DIAGNOSIS — E039 Hypothyroidism, unspecified: Secondary | ICD-10-CM | POA: Diagnosis not present

## 2017-10-18 DIAGNOSIS — H35373 Puckering of macula, bilateral: Secondary | ICD-10-CM | POA: Diagnosis not present

## 2017-10-20 DIAGNOSIS — I4891 Unspecified atrial fibrillation: Secondary | ICD-10-CM | POA: Diagnosis not present

## 2017-10-27 DIAGNOSIS — E538 Deficiency of other specified B group vitamins: Secondary | ICD-10-CM | POA: Diagnosis not present

## 2017-10-27 DIAGNOSIS — E291 Testicular hypofunction: Secondary | ICD-10-CM | POA: Diagnosis not present

## 2017-11-09 DIAGNOSIS — E538 Deficiency of other specified B group vitamins: Secondary | ICD-10-CM | POA: Diagnosis not present

## 2017-11-09 DIAGNOSIS — E291 Testicular hypofunction: Secondary | ICD-10-CM | POA: Diagnosis not present

## 2017-11-24 DIAGNOSIS — H906 Mixed conductive and sensorineural hearing loss, bilateral: Secondary | ICD-10-CM | POA: Diagnosis not present

## 2017-11-24 DIAGNOSIS — E538 Deficiency of other specified B group vitamins: Secondary | ICD-10-CM | POA: Diagnosis not present

## 2017-11-24 DIAGNOSIS — E291 Testicular hypofunction: Secondary | ICD-10-CM | POA: Diagnosis not present

## 2017-11-24 DIAGNOSIS — H6983 Other specified disorders of Eustachian tube, bilateral: Secondary | ICD-10-CM | POA: Diagnosis not present

## 2017-11-24 DIAGNOSIS — E220 Acromegaly and pituitary gigantism: Secondary | ICD-10-CM | POA: Diagnosis not present

## 2017-11-24 DIAGNOSIS — E119 Type 2 diabetes mellitus without complications: Secondary | ICD-10-CM | POA: Diagnosis not present

## 2017-11-24 DIAGNOSIS — H7412 Adhesive left middle ear disease: Secondary | ICD-10-CM | POA: Diagnosis not present

## 2017-12-02 DIAGNOSIS — E039 Hypothyroidism, unspecified: Secondary | ICD-10-CM | POA: Diagnosis not present

## 2017-12-02 DIAGNOSIS — Z79899 Other long term (current) drug therapy: Secondary | ICD-10-CM | POA: Diagnosis not present

## 2017-12-02 DIAGNOSIS — I10 Essential (primary) hypertension: Secondary | ICD-10-CM | POA: Diagnosis not present

## 2017-12-02 DIAGNOSIS — E1159 Type 2 diabetes mellitus with other circulatory complications: Secondary | ICD-10-CM | POA: Diagnosis not present

## 2017-12-02 DIAGNOSIS — E785 Hyperlipidemia, unspecified: Secondary | ICD-10-CM | POA: Diagnosis not present

## 2017-12-07 DIAGNOSIS — E538 Deficiency of other specified B group vitamins: Secondary | ICD-10-CM | POA: Diagnosis not present

## 2017-12-07 DIAGNOSIS — E291 Testicular hypofunction: Secondary | ICD-10-CM | POA: Diagnosis not present

## 2017-12-13 ENCOUNTER — Other Ambulatory Visit: Payer: Self-pay

## 2017-12-14 ENCOUNTER — Ambulatory Visit: Payer: Medicare Other | Admitting: Physical Therapy

## 2017-12-17 ENCOUNTER — Ambulatory Visit: Payer: Medicare Other | Admitting: Rehabilitation

## 2017-12-21 DIAGNOSIS — E538 Deficiency of other specified B group vitamins: Secondary | ICD-10-CM | POA: Diagnosis not present

## 2017-12-21 DIAGNOSIS — E291 Testicular hypofunction: Secondary | ICD-10-CM | POA: Diagnosis not present

## 2017-12-27 DIAGNOSIS — Z5181 Encounter for therapeutic drug level monitoring: Secondary | ICD-10-CM | POA: Diagnosis not present

## 2017-12-27 DIAGNOSIS — E291 Testicular hypofunction: Secondary | ICD-10-CM | POA: Diagnosis not present

## 2017-12-27 DIAGNOSIS — E039 Hypothyroidism, unspecified: Secondary | ICD-10-CM | POA: Diagnosis not present

## 2017-12-27 DIAGNOSIS — E538 Deficiency of other specified B group vitamins: Secondary | ICD-10-CM | POA: Diagnosis not present

## 2017-12-27 DIAGNOSIS — E559 Vitamin D deficiency, unspecified: Secondary | ICD-10-CM | POA: Diagnosis not present

## 2017-12-27 DIAGNOSIS — E1142 Type 2 diabetes mellitus with diabetic polyneuropathy: Secondary | ICD-10-CM | POA: Diagnosis not present

## 2017-12-29 ENCOUNTER — Other Ambulatory Visit: Payer: Self-pay

## 2017-12-29 ENCOUNTER — Ambulatory Visit: Payer: Medicare Other | Attending: Family Medicine | Admitting: Physical Therapy

## 2017-12-29 DIAGNOSIS — R293 Abnormal posture: Secondary | ICD-10-CM | POA: Diagnosis not present

## 2017-12-29 DIAGNOSIS — R2681 Unsteadiness on feet: Secondary | ICD-10-CM | POA: Insufficient documentation

## 2017-12-29 DIAGNOSIS — R2689 Other abnormalities of gait and mobility: Secondary | ICD-10-CM | POA: Diagnosis not present

## 2017-12-29 NOTE — Therapy (Signed)
[place] [address] [place], [place], [address] Phone: [phone]   Fax:  [phone]  Physical Therapy Evaluation  Patient Details  Name: Alan Beltran MRN: [id number] Date of Birth: 1934-06-22 Referring Provider (PT): Dr. Vicie Mutters, MD   Encounter Date: 12/29/2017  PT End of Session - 12/29/17 1722    Visit Number  1    Number of Visits  17    Date for PT Re-Evaluation  03/09/18    Authorization Type  Medicare + [employer]    PT Start Time  1537    PT Stop Time  1618    PT Time Calculation (min)  41 min    Activity Tolerance  Patient tolerated treatment well    Behavior During Therapy  [facility] for tasks assessed/performed       Past Medical History:  Diagnosis Date  . Asthma   . Diabetes mellitus     Past Surgical History:  Procedure Laterality Date  . EXTERNAL EAR SURGERY    . FOOT SURGERY      There were no vitals filed for this visit.   Subjective Assessment - 12/29/17 1538    Subjective  Patient wanting to improve balance - feels like he is unbalanced with turning, bending over; has had 2-3 falls in the past 6 months - feels reason was unsteadiness. Wears B hearing aids and can hear pretty well. Denies dizziness; Reports neuropathy at B LE - at knee joint and below. Wears closed toed shoes.     Patient is accompained by:  Family member   Wife - Alan Beltran   Pertinent History  DM, hearing loss, neuropathy    Patient Stated Goals  "to get my balance to where I feel stronger - I want to get back to playing golf"    Currently in Pain?  No/denies    Multiple Pain Sites  No         [facility] PT Assessment - 12/29/17 1542      Assessment   Medical Diagnosis  Unsteadiness    Referring Provider (PT)  Dr. Vicie Mutters, MD    Onset Date/Surgical Date  --   referral on 11/25/17   Prior Therapy  not for this issue - prior rehab for CVA      Precautions   Precautions  Fall      Restrictions   Weight  Bearing Restrictions  No      Balance Screen   Has the patient fallen in the past 6 months  Yes    How many times?  3    Has the patient had a decrease in activity level because of a fear of falling?   No    Is the patient reluctant to leave their home because of a fear of falling?   No      Home Environment   Living Environment  Private residence    Living Arrangements  Spouse/significant other    Type of [place]  Two level   does not need to navigate stairs   Additional Comments  reacher      Prior Function   Level of Independence  Independent      Cognition   Overall Cognitive Status  Within Functional Limits for tasks assessed      Coordination   Gross Motor Movements are Fluid and Coordinated  Yes      Posture/Postural Control   Posture/Postural Control  Postural  limitations    Postural Limitations  Rounded Shoulders;Forward head;Posterior pelvic tilt      Transfers   Transfers  Sit to Stand;Stand to Sit    Sit to Stand  5: Supervision;With upper extremity assist;With armrests;From chair/3-in-1    Stand to Sit  5: Supervision;With upper extremity assist;With armrests;To chair/3-in-1      Ambulation/Gait   Ambulation/Gait  Yes    Ambulation/Gait Assistance  5: Supervision    Ambulation/Gait Assistance Details  supervision for stability - noted instability with wide BOS (toe out); downward gaze with rounded shoulders; slight LOB with turning    Ambulation Distance (Feet)  100 Feet    Assistive device  None    Gait Pattern  Step-through pattern;Decreased stride length;Trunk flexed    Ambulation Surface  Level;Indoor    Gait velocity  2.05 ft/sec      Balance   Balance Assessed  Yes      Standardized Balance Assessment   Standardized Balance Assessment  Berg Balance Test      Berg Balance Test   Sit to Stand  Able to stand without using hands and stabilize independently    Standing Unsupported  Able to stand 2 minutes with supervision     Sitting with Back Unsupported but Feet Supported on Floor or Stool  Able to sit safely and securely 2 minutes    Stand to Sit  Controls descent by using hands    Transfers  Able to transfer safely, definite need of hands    Standing Unsupported with Eyes Closed  Able to stand 10 seconds with supervision    Standing Ubsupported with Feet Together  Able to place feet together independently and stand for 1 minute with supervision    From Standing, Reach Forward with Outstretched Arm  Can reach forward >5 cm safely (2")    From Standing Position, Pick up Object from [place] to pick up shoe, needs supervision    From Standing Position, Turn to Look Behind Over each Shoulder  Turn sideways only but maintains balance    Turn 360 Degrees  Needs close supervision or verbal cueing    Standing Unsupported, Alternately Place Feet on Step/Stool  Able to complete >2 steps/needs minimal assist    Standing Unsupported, One Foot in Front  Able to take small step independently and hold 30 seconds    Standing on One Leg  Tries to lift leg/unable to hold 3 seconds but remains standing independently    Total Score  35           Vestibular Assessment - 12/29/17 0001      Vestibular Assessment   General Observation  B hearing aids      Occulomotor Exam   Occulomotor Alignment  Normal    Spontaneous  Absent    Gaze-induced  Absent    Smooth Pursuits  Intact    Saccades  --   2 instances of poor trajectory   Comment  B HIT with small catch up saccade          Objective measurements completed on examination: See above findings.              PT Education - 12/29/17 1722    Education Details  exam findings, POC, goal establishment    Person(s) Educated  Patient;Spouse    Methods  Explanation    Comprehension  Verbalized understanding       PT Short Term Goals - 12/29/17 1731  PT SHORT TERM GOAL #1   Title  patient to be independent with initial HEP for balance    Time  4     Period  Weeks    Status  New    Target Date  02/02/18      PT SHORT TERM GOAL #2   Title  Assessment of SOT with goal establishment as indicated    Time  4    Period  Weeks    Status  New    Target Date  02/02/18      PT SHORT TERM GOAL #3   Title  patient to improve Berg by >/= 6 points demonstrating reduced fall risk    Time  4    Period  Weeks    Status  New    Target Date  02/02/18      PT SHORT TERM GOAL #4   Title  patient to improve gait speed to >/= 2.62 ft/sec demosnstraing improved functional mobility    Time  4    Period  Weeks    Status  New    Target Date  02/02/18        PT Long Term Goals - 12/29/17 1733      PT LONG TERM GOAL #1   Title  patient to be independent with advanced HEP    Time  8    Period  Weeks    Status  New    Target Date  03/09/18      PT LONG TERM GOAL #2   Title  patient to improve Berg to >/= 45/56    Time  8    Period  Weeks    Status  New    Target Date  03/09/18      PT LONG TERM GOAL #3   Title  patient to improve gait speed to >/= 3.0 ft/sec    Time  8    Period  Weeks    Status  New    Target Date  03/09/18      PT LONG TERM GOAL #4   Title  patient to demonstrate gait over various levels and surfaces with IND with LRAD with no LOB or overt instability for >/= 500 feet    Time  8    Period  Weeks    Status  New    Target Date  03/09/18      PT LONG TERM GOAL #5   Title  patient to verbalize 3 fall prevention strategies    Time  8    Period  Weeks    Status  New    Target Date  03/09/18             Plan - 12/29/17 1722    Clinical Impression Statement  Mr. Tatus is a very pleasant 83 y/o male presenting to OPPT today regarding primary complaints of unsteadiness and general fear of falling. Patient today with reduced gait speed (see above) as well as scoring 35/56 on Berg Balance demonstrating high fall risk. Will plan to assess SOT at next visit to further assess balance and vestibular input. Patient  to benefit from skilled PT intervention to address the above lsited deficits to allow for improved functional mobility and reduced fall risk.     History and Personal Factors relevant to plan of care:  DM, hearing loss, neuropathy    Clinical Presentation  Evolving    Clinical Presentation due to:  DM, hearing loss, neuropathy, reduced balance,  history of falls    Clinical Decision Making  Moderate    Rehab Potential  Good    PT Frequency  2x / week    PT Duration  8 weeks    PT Treatment/Interventions  ADLs/Self Care Home Management;DME Instruction;Balance training;Therapeutic exercise;Therapeutic activities;Functional mobility training;Stair training;Gait training;Neuromuscular re-education;Patient/family education;Vestibular;Taping;Manual techniques    PT Next Visit Plan  SOT assessment; HEP for balance    Consulted and Agree with Plan of Care  Patient       Patient will benefit from skilled therapeutic intervention in order to improve the following deficits and impairments:  Abnormal gait, Decreased activity tolerance, Decreased balance, Difficulty walking, Decreased mobility, Postural dysfunction  Visit Diagnosis: Unsteadiness on feet  Other abnormalities of gait and mobility  Abnormal posture     Problem List Patient Active Problem List   Diagnosis Date Noted  . Diabetes mellitus 09/04/2010     Lanney Gins, PT, DPT Supplemental Physical Therapist 12/29/17 5:35 PM Pager: [phone] Office: [facility] [address] [place], [place], [address] Phone: [phone]   Fax:  [phone]  Name: Alan Beltran MRN: [id number] Date of Birth: Jan 25, 1935

## 2018-01-04 DIAGNOSIS — B354 Tinea corporis: Secondary | ICD-10-CM | POA: Diagnosis not present

## 2018-01-05 ENCOUNTER — Encounter: Payer: Self-pay | Admitting: Sports Medicine

## 2018-01-05 ENCOUNTER — Ambulatory Visit (INDEPENDENT_AMBULATORY_CARE_PROVIDER_SITE_OTHER): Payer: Medicare Other | Admitting: Sports Medicine

## 2018-01-05 VITALS — BP 136/84 | HR 82 | Resp 16

## 2018-01-05 DIAGNOSIS — E538 Deficiency of other specified B group vitamins: Secondary | ICD-10-CM | POA: Diagnosis not present

## 2018-01-05 DIAGNOSIS — B351 Tinea unguium: Secondary | ICD-10-CM

## 2018-01-05 DIAGNOSIS — E1140 Type 2 diabetes mellitus with diabetic neuropathy, unspecified: Secondary | ICD-10-CM

## 2018-01-05 DIAGNOSIS — E291 Testicular hypofunction: Secondary | ICD-10-CM | POA: Diagnosis not present

## 2018-01-05 DIAGNOSIS — M79676 Pain in unspecified toe(s): Secondary | ICD-10-CM

## 2018-01-05 NOTE — Progress Notes (Signed)
Patient ID: Alan Beltran, male   DOB: 1934/04/19, 83 y.o.   MRN: [id number] Subjective: Alan Beltran is a 83 y.o. male patient with history of diabetes who returns to office today complaining of long, painful nails  while ambulating in shoes; unable to trim. Patient states that the glucose reading was 324, last A1c 12.3 and was seen last by his primary care doctor Dr. Venetia Maxon 3 weeks ago.  Admits working with endocrinologist to get his blood sugar. Patient denies any other new changes in medication or other new problems since last visit besides adjustments to insulin.   Patient Active Problem List   Diagnosis Date Noted  . Diabetes mellitus 09/04/2010   Current Outpatient Medications on File Prior to Visit  Medication Sig Dispense Refill  . ALLOPURINOL PO Take by mouth.      . FOLIC ACID PO Take by mouth.      . Levothyroxine Sodium (SYNTHROID PO) Take by mouth.      . metFORMIN (GLUCOPHAGE-XR) 500 MG 24 hr tablet TAKE TWO TABLETS BY MOUTH ONCE DAILY WITH EVENING MEAL  1  . OMEPRAZOLE PO Take by mouth.      . Pioglitazone HCl (ACTOS PO) Take by mouth.      . Rosuvastatin Calcium (CRESTOR PO) Take by mouth.      . SERTRALINE HCL PO Take by mouth.      . SPIRONOLACTONE PO Take by mouth.      . TERAZOSIN HCL PO Take by mouth.      . TRUEPLUS LANCETS 33G MISC   5  . TRUETRACK TEST test strip   4  . WARFARIN SODIUM PO Take by mouth.      . Zinc Sulfate (ZINC 15 PO) Take by mouth.       No current facility-administered medications on file prior to visit.    No Known Allergies  No results found for this or any previous visit (from the past 2160 hour(s)).  Objective: General: Patient is awake, alert, and oriented x 3 and in no acute distress.  Integument: Skin is warm, dry and supple bilateral. Nails are tender, long, thickened and dystrophic with subungual debris, consistent with onychomycosis, 1-5 bilateral.  No open lesions or preulcerative lesions present bilateral. Remaining integument  unremarkable.  Vasculature:  Dorsalis Pedis pulse 1/4 bilateral. Posterior Tibial pulse  1/4 bilateral. Capillary fill time <3 sec 1-5 bilateral. Diminished hair growth to the level of the digits.Temperature gradient within normal limits. No varicosities present bilateral.  Trace chronic  edema present bilateral.   Neurology: The patient has intact sensation measured with a 5.07/10g Semmes Weinstein Monofilament at all pedal sites bilateral. Vibratory sensation diminished bilateral with tuning fork. No Babinski sign present bilateral.   Musculoskeletal: Asymptomatic hammertoe pedal deformities noted bilateral. Muscular strength 5/5 in all lower extremity muscular groups bilateral without pain on range of motion. No tenderness with calf compression bilateral.  Assessment and Plan: Problem List Items Addressed This Visit    None    Visit Diagnoses    Pain due to onychomycosis of toenail    -  Primary   Type 2 diabetes, controlled, with neuropathy (HCC)       Relevant Medications   metFORMIN (GLUCOPHAGE-XR) 500 MG 24 hr tablet     -Examined patient. -Discussed and educated patient on diabetic foot care, especially with  regards to the vascular, neurological and musculoskeletal systems. -Mechanically debrided all nails 1-5 bilateral using sterile nail nipper and filed with dremel without incident  -Patient  to return in 3 months for at risk foot/diabetic nail care -Recommend tea tree oil for finger nails -Patient advised to call the office if any problems or questions arise in the meantime.  Landis Martins, DPM

## 2018-01-10 DIAGNOSIS — D485 Neoplasm of uncertain behavior of skin: Secondary | ICD-10-CM | POA: Diagnosis not present

## 2018-01-10 DIAGNOSIS — L309 Dermatitis, unspecified: Secondary | ICD-10-CM | POA: Diagnosis not present

## 2018-01-12 ENCOUNTER — Ambulatory Visit: Payer: Medicare Other | Admitting: Rehabilitative and Restorative Service Providers"

## 2018-01-13 ENCOUNTER — Ambulatory Visit: Payer: Medicare Other | Admitting: Rehabilitative and Restorative Service Providers"

## 2018-01-13 DIAGNOSIS — L249 Irritant contact dermatitis, unspecified cause: Secondary | ICD-10-CM | POA: Diagnosis not present

## 2018-01-17 DIAGNOSIS — E538 Deficiency of other specified B group vitamins: Secondary | ICD-10-CM | POA: Diagnosis not present

## 2018-01-17 DIAGNOSIS — E291 Testicular hypofunction: Secondary | ICD-10-CM | POA: Diagnosis not present

## 2018-01-21 ENCOUNTER — Encounter: Payer: Self-pay | Admitting: Rehabilitative and Restorative Service Providers"

## 2018-01-21 ENCOUNTER — Ambulatory Visit: Payer: Medicare Other | Admitting: Rehabilitative and Restorative Service Providers"

## 2018-01-21 VITALS — BP 108/60

## 2018-01-21 DIAGNOSIS — R2689 Other abnormalities of gait and mobility: Secondary | ICD-10-CM | POA: Diagnosis not present

## 2018-01-21 DIAGNOSIS — R293 Abnormal posture: Secondary | ICD-10-CM | POA: Diagnosis not present

## 2018-01-21 DIAGNOSIS — R2681 Unsteadiness on feet: Secondary | ICD-10-CM

## 2018-01-21 NOTE — Therapy (Signed)
[facility] [address] [place], [name], [address] Phone: [phone]   Fax:  [phone]  Physical Therapy Treatment  Patient Details  Name: Alan Beltran MRN: [id number] Date of Birth: 04-Dec-1934 Referring Provider (PT): Dr. Vicie Mutters, MD   Encounter Date: 01/21/2018  PT End of Session - 01/21/18 1608    Visit Number  2    Number of Visits  17    Date for PT Re-Evaluation  03/09/18    Authorization Type  Medicare + [employer]    PT Start Time  1450    PT Stop Time  1535    PT Time Calculation (min)  45 min    Equipment Utilized During Treatment  Gait belt    Activity Tolerance  Patient tolerated treatment well    Behavior During Therapy  [facility] for tasks assessed/performed       Past Medical History:  Diagnosis Date  . Asthma   . Diabetes mellitus     Past Surgical History:  Procedure Laterality Date  . EXTERNAL EAR SURGERY    . FOOT SURGERY      Vitals:   01/21/18 1517  BP: 108/60    Subjective Assessment - 01/21/18 1453    Subjective  Patient notes he has had a rash on his arms and a contact dermatitis.  He notes he hasn't been able to shave for days due to dermatitis.    Pertinent History  DM, hearing loss, neuropathy    Patient Stated Goals  "to get my balance to where I feel stronger - I want to get back to playing golf"    Currently in Pain?  No/denies             Vestibular Assessment - 01/21/18 1520      Vestibular Assessment   General Observation  Patient c/o sudden "off balance" feeling occurring when sitting down after standing exercises.  It improved within minutes.  See vitals.       Occulomotor Exam   Occulomotor Alignment  Abnormal   L eye hypertropia   Spontaneous  Absent    Gaze-induced  Absent    Smooth Pursuits  Intact    Saccades  Slow    Comment  *Patient reports a h/o brain bleed 2-3 years ago-- hospitalized in [place] per reports.                 [facility] Adult PT Treatment/Exercise - 01/21/18 1605      Ambulation/Gait   Ambulation/Gait  Yes    Ambulation/Gait Assistance  5: Supervision    Ambulation/Gait Assistance Details  Patient needs cues for upright posture, longer stride length.    Ambulation Distance (Feet)  230 Feet   100 x 2   Assistive device  None    Ambulation Surface  Level;Indoor      Vestibular Treatment/Exercise - 01/21/18 1606      Vestibular Treatment/Exercise   Vestibular Treatment Provided  Gaze    Gaze Exercises  X1 Viewing Horizontal      X1 Viewing Horizontal   Foot Position  sitting    Reps  2    Comments  x 30 seconds with cues and demonstration on correct technique         Balance Exercises - 01/21/18 1514      [place] PROGRAM   Head Movements  Standing;5 reps    Back Extension  Standing;5 reps    Ankle Movements  Sitting;10 reps  Knee Extensor  10 reps    Knee Flexor  10 reps    Hip ABductor  10 reps    Ankle Plantorflexors  20 reps, support        PT Education - 01/21/18 1603    Education Details  [place] HEP    Person(s) Educated  Patient    Methods  Explanation;Demonstration;Handout    Comprehension  Verbalized understanding;Returned demonstration       PT Short Term Goals - 12/29/17 1731      PT SHORT TERM GOAL #1   Title  patient to be independent with initial HEP for balance    Time  4    Period  Weeks    Status  New    Target Date  02/02/18      PT SHORT TERM GOAL #2   Title  Assessment of SOT with goal establishment as indicated    Time  4    Period  Weeks    Status  New    Target Date  02/02/18      PT SHORT TERM GOAL #3   Title  patient to improve Berg by >/= 6 points demonstrating reduced fall risk    Time  4    Period  Weeks    Status  New    Target Date  02/02/18      PT SHORT TERM GOAL #4   Title  patient to improve gait speed to >/= 2.62 ft/sec demosnstraing improved functional mobility    Time  4    Period  Weeks    Status   New    Target Date  02/02/18        PT Long Term Goals - 12/29/17 1733      PT LONG TERM GOAL #1   Title  patient to be independent with advanced HEP    Time  8    Period  Weeks    Status  New    Target Date  03/09/18      PT LONG TERM GOAL #2   Title  patient to improve Berg to >/= 45/56    Time  8    Period  Weeks    Status  New    Target Date  03/09/18      PT LONG TERM GOAL #3   Title  patient to improve gait speed to >/= 3.0 ft/sec    Time  8    Period  Weeks    Status  New    Target Date  03/09/18      PT LONG TERM GOAL #4   Title  patient to demonstrate gait over various levels and surfaces with IND with LRAD with no LOB or overt instability for >/= 500 feet    Time  8    Period  Weeks    Status  New    Target Date  03/09/18      PT LONG TERM GOAL #5   Title  patient to verbalize 3 fall prevention strategies    Time  8    Period  Weeks    Status  New    Target Date  03/09/18            Plan - 01/21/18 1606    Clinical Impression Statement  The patient tolerated HEP/ [place] program well.  See flow sheet for [facility] HEP added.  Also provided gaze x 1 adaptation for home.  PT to continue working to [employer],  strengthening, general endurance and multi-sensory training.     PT Treatment/Interventions  ADLs/Self Care Home Management;DME Instruction;Balance training;Therapeutic exercise;Therapeutic activities;Functional mobility training;Stair training;Gait training;Neuromuscular re-education;Patient/family education;Vestibular;Taping;Manual techniques    PT Next Visit Plan  Check HEP, do SOT per eval.  *Work on corner activities with eyes closed (per patient noting he can't close his eyes in standing).      Consulted and Agree with Plan of Care  Patient       Patient will benefit from skilled therapeutic intervention in order to improve the following deficits and impairments:  Abnormal gait, Decreased activity tolerance, Decreased balance,  Difficulty walking, Decreased mobility, Postural dysfunction  Visit Diagnosis: Unsteadiness on feet  Other abnormalities of gait and mobility  Abnormal posture     Problem List Patient Active Problem List   Diagnosis Date Noted  . Diabetes mellitus 09/04/2010    Mickala Laton, PT 01/21/2018, 4:08 PM  [facility] [address] [place], [name], [address] Phone: [phone]   Fax:  [phone]  Name: Alan Beltran MRN: [id number] Date of Birth: 11-08-1934

## 2018-01-21 NOTE — Patient Instructions (Signed)
Gaze Stabilization: Sitting    Keeping eyes on target held in your hand, and move head side to side for _30__ seconds. Repeat while moving head up and down for __30__ seconds. Do __2-3__ sessions per day.  Copyright  VHI. All rights reserved.   Gaze Stabilization: Tip Card  1.Target must remain in focus, not blurry, and appear stationary while head is in motion. 2.Perform exercises with small head movements (45 to either side of midline). 3.Increase speed of head motion so long as target is in focus. 4.If you wear eyeglasses, be sure you can see target through lens (therapist will give specific instructions for bifocal / progressive lenses). 5.These exercises may provoke dizziness or nausea. Work through these symptoms. If too dizzy, slow head movement slightly. Rest between each exercise. 6.Exercises demand concentration; avoid distractions.  Copyright  VHI. All rights reserved.

## 2018-01-27 ENCOUNTER — Ambulatory Visit: Payer: Medicare Other | Attending: Family Medicine | Admitting: Physical Therapy

## 2018-01-27 ENCOUNTER — Encounter: Payer: Self-pay | Admitting: Physical Therapy

## 2018-01-27 DIAGNOSIS — R293 Abnormal posture: Secondary | ICD-10-CM | POA: Diagnosis not present

## 2018-01-27 DIAGNOSIS — R2681 Unsteadiness on feet: Secondary | ICD-10-CM | POA: Insufficient documentation

## 2018-01-27 DIAGNOSIS — R2689 Other abnormalities of gait and mobility: Secondary | ICD-10-CM | POA: Diagnosis not present

## 2018-01-27 NOTE — Therapy (Signed)
[facility] [address] [place], [name], [address] Phone: [phone]   Fax:  [phone]  Physical Therapy Treatment  Patient Details  Name: Alan Beltran MRN: [id number] Date of Birth: 1934-04-08 Referring Provider (PT): Dr. Vicie Mutters, MD   Encounter Date: 01/27/2018  PT End of Session - 01/27/18 1452    Visit Number  3    Number of Visits  17    Date for PT Re-Evaluation  03/09/18    Authorization Type  Medicare + [employer]    PT Start Time  1450    PT Stop Time  1533    PT Time Calculation (min)  43 min    Activity Tolerance  Patient tolerated treatment well    Behavior During Therapy  [facility] for tasks assessed/performed       Past Medical History:  Diagnosis Date  . Asthma   . Diabetes mellitus     Past Surgical History:  Procedure Laterality Date  . EXTERNAL EAR SURGERY    . FOOT SURGERY      There were no vitals filed for this visit.  Subjective Assessment - 01/27/18 2017    Subjective  Reports no changes or falls. Reiterates his goal to improve his balance to allow him to play golf again.     Pertinent History  DM, hearing loss, neuropathy    Patient Stated Goals  "to get my balance to where I feel stronger - I want to get back to playing golf"    Currently in Pain?  No/denies                       [facility] Adult PT Treatment/Exercise - 01/27/18 0001      Standardized Balance Assessment   Standardized Balance Assessment  Balance Master Testing        Sensory Organization Testing Composite balance score: 56 compared to age/height normative value of 63  Patient demonstrated scores of:  87 for use of somatosensory feedback for balance (age/height normative value of 81),  74 for use of visual feedback for balance (age/height normative value of 74),   65 for use of vestibular feedback for balance (age/height normative value of 50).   COG readings were WNL except 5 trials  shifted to his right beyond normal range.   Condition 1: passed 3 trials Condition 2: passed 3 trials Condition 3: 2 fails, one pass Condition 4: failed 3 trials Condition 5: passed 3 trials Condition 6: 2 falls, passed 3rd trial        PT Education - 01/27/18 2019    Education Details  results of SOT    Person(s) Educated  Patient    Methods  Explanation;Handout    Comprehension  Verbalized understanding       PT Short Term Goals - 01/27/18 2102      PT SHORT TERM GOAL #1   Title  patient to be independent with initial HEP for balance    Time  4    Period  Weeks    Status  New      PT SHORT TERM GOAL #2   Title  Assessment of SOT with goal establishment as indicated    Baseline  01/27/18 composite score 56    Time  4    Period  Weeks    Status  Achieved      PT SHORT TERM GOAL #3   Title  patient to improve Berg by >/=  6 points demonstrating reduced fall risk    Time  4    Period  Weeks    Status  New      PT SHORT TERM GOAL #4   Title  patient to improve gait speed to >/= 2.62 ft/sec demosnstraing improved functional mobility    Time  4    Period  Weeks    Status  New        PT Long Term Goals - 01/27/18 2105      PT LONG TERM GOAL #1   Title  patient to be independent with advanced HEP    Time  8    Period  Weeks    Status  New      PT LONG TERM GOAL #2   Title  patient to improve Berg to >/= 45/56    Time  8    Period  Weeks    Status  New      PT LONG TERM GOAL #3   Title  patient to improve gait speed to >/= 3.0 ft/sec    Time  8    Period  Weeks    Status  New      PT LONG TERM GOAL #4   Title  patient to demonstrate gait over various levels and surfaces with IND with LRAD with no LOB or overt instability for >/= 500 feet    Time  8    Period  Weeks    Status  New      PT LONG TERM GOAL #5   Title  patient to verbalize 3 fall prevention strategies    Time  8    Period  Weeks    Status  New      Additional Long Term Goals    Additional Long Term Goals  Yes      PT LONG TERM GOAL #6   Title  Patient will improve composite SOT score to 60     Baseline  01/27/18  56    Status  New            Plan - 01/27/18 1632    Clinical Impression Statement  Patient arrived late for session. Completed SOT using BalanceMaster. See note for full details. Overall, pt's composite score (56) was below normal (63) for his age-based comparison. His worst scores were condition 3 (eyes open, visual misinformation), condition 4 (eyes open, floor sway) and condition 6 (eyes open, visual misinformation, floor sway)--surprisingly all condiitons were with eyes open. The two conditions with eyes closed ( 2, 5) pt scored above normal on all trials. Patient relies slightly more on hip strategy than ankle strategy. Patient will benefit from PT to address LE weakness and balance deficits.     Rehab Potential  Good    PT Frequency  2x / week    PT Duration  8 weeks    PT Treatment/Interventions  ADLs/Self Care Home Management;DME Instruction;Balance training;Therapeutic exercise;Therapeutic activities;Functional mobility training;Stair training;Gait training;Neuromuscular re-education;Patient/family education;Vestibular;Taping;Manual techniques    PT Next Visit Plan  Ask if he can come at an earlier time 1/31 [employer] is now off 1/31 and need to reschedule his 1/31 @ 3:30) Check HEP, Work on ankle strategy vs hip strategy vs step; compliant surfaces with EO and EC    Consulted and Agree with Plan of Care  Patient       Patient will benefit from skilled therapeutic intervention in order to improve the following deficits and impairments:  Abnormal gait,  Decreased activity tolerance, Decreased balance, Difficulty walking, Decreased mobility, Postural dysfunction  Visit Diagnosis: Unsteadiness on feet  Other abnormalities of gait and mobility  Abnormal posture     Problem List Patient Active Problem List   Diagnosis Date Noted  . Diabetes  mellitus 09/04/2010    Rexanne Mano, PT 01/27/2018, 9:09 PM  [facility] [address] [place], [name], [address] Phone: [phone]   Fax:  [phone]  Name: Alan Beltran MRN: [id number] Date of Birth: [birth year]

## 2018-01-28 ENCOUNTER — Ambulatory Visit: Payer: Medicare Other | Admitting: Physical Therapy

## 2018-02-01 ENCOUNTER — Ambulatory Visit: Payer: Medicare Other | Admitting: Physical Therapy

## 2018-02-01 ENCOUNTER — Encounter: Payer: Self-pay | Admitting: Physical Therapy

## 2018-02-01 DIAGNOSIS — E538 Deficiency of other specified B group vitamins: Secondary | ICD-10-CM | POA: Diagnosis not present

## 2018-02-01 DIAGNOSIS — R2689 Other abnormalities of gait and mobility: Secondary | ICD-10-CM | POA: Diagnosis not present

## 2018-02-01 DIAGNOSIS — E291 Testicular hypofunction: Secondary | ICD-10-CM | POA: Diagnosis not present

## 2018-02-01 DIAGNOSIS — R2681 Unsteadiness on feet: Secondary | ICD-10-CM | POA: Diagnosis not present

## 2018-02-01 DIAGNOSIS — E1142 Type 2 diabetes mellitus with diabetic polyneuropathy: Secondary | ICD-10-CM | POA: Diagnosis not present

## 2018-02-01 DIAGNOSIS — R293 Abnormal posture: Secondary | ICD-10-CM | POA: Diagnosis not present

## 2018-02-01 DIAGNOSIS — E559 Vitamin D deficiency, unspecified: Secondary | ICD-10-CM | POA: Diagnosis not present

## 2018-02-01 DIAGNOSIS — E039 Hypothyroidism, unspecified: Secondary | ICD-10-CM | POA: Diagnosis not present

## 2018-02-01 NOTE — Patient Instructions (Addendum)
Gaze Stabilization: Sitting    Tape target to the wall about 3 feet away (choose a plain door or wall). Keeping eyes on target, and move head side to side for _30__ seconds. Repeat while moving head up and down for __30__ seconds. Do __2-3__ sessions per day.  Copyright  VHI. All rights reserved.   Gaze Stabilization: Tip Card  1.Target must remain in focus, not blurry, and appear stationary while head is in motion. 2.Perform exercises with small head movements (45 to either side of midline). 3.Increase speed of head motion so long as target is in focus. 4.If you wear eyeglasses, be sure you can see target through lens (therapist will give specific instructions for bifocal / progressive lenses). 5.These exercises may provoke dizziness or nausea. Work through these symptoms. If too dizzy, slow head movement slightly. Rest between each exercise. 6.Exercises demand concentration; avoid distractions.   Access Code: [email or web address]  URL: [email or web address]  Date: 02/01/2018  Prepared by: Barry Brunner   Exercises  Romberg Stance with Head Nods - 1 sets - - hold - 1x daily - 6x weekly

## 2018-02-01 NOTE — Therapy (Signed)
[facility] [address] [place], [name], [address] Phone: [phone]   Fax:  [phone]  Physical Therapy Treatment  Patient Details  Name: Alan Beltran MRN: [id number] Date of Birth: [birth year] Referring Provider (PT): Dr. Vicie Mutters, MD   Encounter Date: 02/01/2018  PT End of Session - 02/01/18 1144    Visit Number  4    Number of Visits  17    Date for PT Re-Evaluation  03/09/18    Authorization Type  Medicare + [employer]    PT Start Time  1144    PT Stop Time  1235    PT Time Calculation (min)  51 min    Activity Tolerance  Patient tolerated treatment well    Behavior During Therapy  [facility] for tasks assessed/performed       Past Medical History:  Diagnosis Date  . Asthma   . Diabetes mellitus     Past Surgical History:  Procedure Laterality Date  . EXTERNAL EAR SURGERY    . FOOT SURGERY      There were no vitals filed for this visit.  Subjective Assessment - 02/01/18 1145    Subjective  denies changes    Pertinent History  DM, hearing loss, neuropathy    Patient Stated Goals  "to get my balance to where I feel stronger - I want to get back to playing golf"    Currently in Pain?  No/denies                        Vestibular Treatment/Exercise - 02/01/18 0001      Vestibular Treatment/Exercise   Vestibular Treatment Provided  Gaze    Gaze Exercises  X1 Viewing Horizontal;X1 Viewing Vertical      X1 Viewing Horizontal   Foot Position  sitting    Time  --   up to 30 sec   Reps  5    Comments  denies dizziness or nausea; requires instructional cues for technique as pt initially moving his eyes off target and keeping head still; must turn head slowly to keep target still and in focus      X1 Viewing Vertical   Foot Position  sitting    Time  --   15 sec   Reps  3    Comments  quickly reduces degree of head movment and requires nearly constant cues to keep head moving         Balance Exercises - 02/01/18 2050      Balance Exercises: Standing   Standing Eyes Opened  Narrow base of support (BOS);Wide (BOA);Head turns;Foam/compliant surface;Solid surface    Standing Eyes Closed  Narrow base of support (BOS);Wide (BOA);Solid surface        PT Education - 02/01/18 2051    Education Details  review of technique for HEP and addition to HEP    Person(s) Educated  Patient    Methods  Explanation;Demonstration;Verbal cues;Handout    Comprehension  Verbal cues required;Returned demonstration;Verbalized understanding;Need further instruction       PT Short Term Goals - 01/27/18 2102      PT SHORT TERM GOAL #1   Title  patient to be independent with initial HEP for balance    Time  4    Period  Weeks    Status  New      PT SHORT TERM GOAL #2   Title  Assessment of SOT with goal establishment as indicated  Baseline  01/27/18 composite score 56    Time  4    Period  Weeks    Status  Achieved      PT SHORT TERM GOAL #3   Title  patient to improve Berg by >/= 6 points demonstrating reduced fall risk    Time  4    Period  Weeks    Status  New      PT SHORT TERM GOAL #4   Title  patient to improve gait speed to >/= 2.62 ft/sec demosnstraing improved functional mobility    Time  4    Period  Weeks    Status  New        PT Long Term Goals - 01/27/18 2105      PT LONG TERM GOAL #1   Title  patient to be independent with advanced HEP    Time  8    Period  Weeks    Status  New      PT LONG TERM GOAL #2   Title  patient to improve Berg to >/= 45/56    Time  8    Period  Weeks    Status  New      PT LONG TERM GOAL #3   Title  patient to improve gait speed to >/= 3.0 ft/sec    Time  8    Period  Weeks    Status  New      PT LONG TERM GOAL #4   Title  patient to demonstrate gait over various levels and surfaces with IND with LRAD with no LOB or overt instability for >/= 500 feet    Time  8    Period  Weeks    Status  New      PT LONG  TERM GOAL #5   Title  patient to verbalize 3 fall prevention strategies    Time  8    Period  Weeks    Status  New      Additional Long Term Goals   Additional Long Term Goals  Yes      PT LONG TERM GOAL #6   Title  Patient will improve composite SOT score to 60     Baseline  01/27/18  56    Status  New            Plan - 02/01/18 2053    Clinical Impression Statement  Assessed pt's understanding of VORx1 for HEP and pt doing incorrectly. With max cues and demonstrations pt able to progress to completing exercise correctly (although at a slower speed until he can incr speed and target stays sitll and in focus). Remainder of session focused on balance and vestibular challenges with focus on feeling his weight  eveningly distribuated between left and right and heel to toe. Educated on safe manner to perform "corner" exercises with pt verbalizing understanidin.     Rehab Potential  Good    PT Frequency  2x / week    PT Duration  8 weeks    PT Treatment/Interventions  ADLs/Self Care Home Management;DME Instruction;Balance training;Therapeutic exercise;Therapeutic activities;Functional mobility training;Stair training;Gait training;Neuromuscular re-education;Patient/family education;Vestibular;Taping;Manual techniques    PT Next Visit Plan  [place]; Check HEP, Work on ankle strategy vs hip strategy vs step; compliant surfaces with EO and EC    Consulted and Agree with Plan of Care  Patient       Patient will benefit from skilled therapeutic intervention in order to improve the following  deficits and impairments:  Abnormal gait, Decreased activity tolerance, Decreased balance, Difficulty walking, Decreased mobility, Postural dysfunction  Visit Diagnosis: Unsteadiness on feet  Other abnormalities of gait and mobility     Problem List Patient Active Problem List   Diagnosis Date Noted  . Diabetes mellitus 09/04/2010    Rexanne Mano, PT 02/01/2018, 9:04 PM  [facility] [address] [place], [name], [address] Phone: [phone]   Fax:  [phone]  Name: Alan Beltran MRN: [id number] Date of Birth: 03-23-1934

## 2018-02-02 ENCOUNTER — Encounter: Payer: Self-pay | Admitting: Rehabilitative and Restorative Service Providers"

## 2018-02-02 ENCOUNTER — Ambulatory Visit: Payer: Medicare Other | Admitting: Rehabilitative and Restorative Service Providers"

## 2018-02-02 DIAGNOSIS — R293 Abnormal posture: Secondary | ICD-10-CM

## 2018-02-02 DIAGNOSIS — R2689 Other abnormalities of gait and mobility: Secondary | ICD-10-CM | POA: Diagnosis not present

## 2018-02-02 DIAGNOSIS — R2681 Unsteadiness on feet: Secondary | ICD-10-CM | POA: Diagnosis not present

## 2018-02-02 NOTE — Therapy (Signed)
[facility] [address] [place], [name], [address] Phone: [phone]   Fax:  [phone]  Physical Therapy Treatment  Patient Details  Name: Alan Beltran MRN: [id number] Date of Birth: 16-Aug-1934 Referring Provider (PT): Dr. Vicie Mutters, MD   Encounter Date: 02/02/2018  PT End of Session - 02/02/18 1112    Visit Number  5    Number of Visits  17    Date for PT Re-Evaluation  03/09/18    Authorization Type  Medicare + [employer]    PT Start Time  1107    PT Stop Time  1148    PT Time Calculation (min)  41 min    Activity Tolerance  Patient tolerated treatment well    Behavior During Therapy  [facility] for tasks assessed/performed       Past Medical History:  Diagnosis Date  . Asthma   . Diabetes mellitus     Past Surgical History:  Procedure Laterality Date  . EXTERNAL EAR SURGERY    . FOOT SURGERY      There were no vitals filed for this visit.  Subjective Assessment - 02/02/18 1113    Subjective  The patient is doing HEP regularly.  He notes some improvement in his balance with activities.    Pertinent History  DM, hearing loss, neuropathy    Patient Stated Goals  "to get my balance to where I feel stronger - I want to get back to playing golf"    Currently in Pain?  No/denies         [facility] PT Assessment - 02/02/18 1116      Ambulation/Gait   Ambulation/Gait  Yes    Ambulation/Gait Assistance  5: Supervision    Assistive device  None    Gait velocity  1.92 ft/sec without a device      Standardized Balance Assessment   Standardized Balance Assessment  Berg Balance Test      Berg Balance Test   Sit to Stand  Able to stand without using hands and stabilize independently    Standing Unsupported  Able to stand safely 2 minutes    Sitting with Back Unsupported but Feet Supported on Floor or Stool  Able to sit safely and securely 2 minutes    Stand to Sit  Sits safely with minimal use of hands     Transfers  Able to transfer safely, minor use of hands    Standing Unsupported with Eyes Closed  Able to stand 10 seconds safely    Standing Ubsupported with Feet Together  Able to place feet together independently and stand 1 minute safely    From Standing, Reach Forward with Outstretched Arm  Can reach forward >12 cm safely (5")    From Standing Position, Pick up Object from Floor  Able to pick up shoe safely and easily    From Standing Position, Turn to Look Behind Over each Shoulder  Turn sideways only but maintains balance    Turn 360 Degrees  Needs close supervision or verbal cueing   feels a little dizzy after turning   Standing Unsupported, Alternately Place Feet on Step/Stool  Able to stand independently and safely and complete 8 steps in 20 seconds    Standing Unsupported, One Foot in Front  Able to take small step independently and hold 30 seconds    Standing on One Leg  Able to lift leg independently and hold equal to or more than 3 seconds  Total Score  46    Berg comment:  46/56 improved from 35/56 at evaluation.                   [facility] Adult PT Treatment/Exercise - 02/02/18 1116      Transfers   Transfers  Sit to Stand;Stand to Sit      Ambulation/Gait   Ambulation/Gait Assistance Details  PT providing cues for upright posture/ head position.      Ambulation Distance (Feet)  200 Feet   115 x 3   Gait Pattern  Step-through pattern;Decreased stride length;Trunk flexed    Ambulation Surface  Level;Indoor    Gait Comments  Performed forward/backwards walking near support surface with intermittent UE support x 20 feet x 2 reps.       Neuro Re-ed    Neuro Re-ed Details   Standing lateral reaching for weight shift right and left sides with CGA for support near countertop.  Standing rotation reaching right and left sides x 10 reps working on trunk rotation and balance.  Worked on single leg stance and turns.  REviewed HEP of gaze x 1 adaptation.                  PT Short Term Goals - 02/02/18 1114      PT SHORT TERM GOAL #1   Title  patient to be independent with initial HEP for balance    Time  4    Period  Weeks    Status  Partially Met      PT SHORT TERM GOAL #2   Title  Assessment of SOT with goal establishment as indicated    Baseline  01/27/18 composite score 56    Time  4    Period  Weeks    Status  Achieved      PT SHORT TERM GOAL #3   Title  patient to improve Berg by >/= 6 points demonstrating reduced fall risk    Baseline  IMproved from 35/56 to 46/56.    Time  4    Period  Weeks    Status  Achieved      PT SHORT TERM GOAL #4   Title  patient to improve gait speed to >/= 2.62 ft/sec demosnstraing improved functional mobility    Baseline  Gait speed=2.05 ft/sec at eval and 1.92 ft/sec today.    Time  4    Period  Weeks    Status  Not Met        PT Long Term Goals - 02/02/18 1233      PT LONG TERM GOAL #1   Title  patient to be independent with advanced HEP. (LTG date 03/09/2018)    Time  8    Period  Weeks    Status  On-going      PT LONG TERM GOAL #2   Title  patient to improve Berg to >/= 48/56.    Baseline  Revised/updated 02/02/2018 with patient scoring 46/56.    Time  8    Period  Weeks    Status  Revised      PT LONG TERM GOAL #3   Title  patient to improve gait speed to >/= 2.6 ft/sec    Baseline  Revised/updated 02/02/2018 due to STG performance.    Time  8    Period  Weeks    Status  Revised      PT LONG TERM GOAL #4   Title  patient to demonstrate  gait over various levels and surfaces with IND with LRAD with no LOB or overt instability for >/= 500 feet    Time  8    Period  Weeks    Status  On-going      PT LONG TERM GOAL #5   Title  patient to verbalize 3 fall prevention strategies    Time  8    Period  Weeks    Status  On-going      PT LONG TERM GOAL #6   Title  Patient will improve composite SOT score to 60     Baseline  01/27/18  56    Status  On-going             Plan - 02/02/18 1148    Clinical Impression Statement  The patient continues to need cues during home program performance.  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  Limited endurance + balance are barriers to returning to recreational activities.  PT encouraging greater ambulationin home as patient notes a heaviness in his legs with extended gait and they "quit" during walking.      PT Treatment/Interventions  ADLs/Self Care Home Management;DME Instruction;Balance training;Therapeutic exercise;Therapeutic activities;Functional mobility training;Stair training;Gait training;Neuromuscular re-education;Patient/family education;Vestibular;Taping;Manual techniques    PT Next Visit Plan  Work on ankle versus hip strategy versus step strategy; compliant surfaces with EO and EC; trunk rotation and weight shifting for pre-putting and pre-golf work.    Consulted and Agree with Plan of Care  Patient       Patient will benefit from skilled therapeutic intervention in order to improve the following deficits and impairments:  Abnormal gait, Decreased activity tolerance, Decreased balance, Difficulty walking, Decreased mobility, Postural dysfunction  Visit Diagnosis: Unsteadiness on feet  Other abnormalities of gait and mobility  Abnormal posture     Problem List Patient Active Problem List   Diagnosis Date Noted  . Diabetes mellitus 09/04/2010    Mayce Noyes, PT 02/02/2018, 12:39 PM  [facility] [address] [place], [name], [address] Phone: [phone]   Fax:  [phone]  Name: Alan Beltran MRN: [id number] Date of Birth: April 09, 1934

## 2018-02-03 ENCOUNTER — Ambulatory Visit: Payer: Medicare Other | Admitting: Physical Therapy

## 2018-02-08 ENCOUNTER — Ambulatory Visit: Payer: Medicare Other | Admitting: Physical Therapy

## 2018-02-08 DIAGNOSIS — R2681 Unsteadiness on feet: Secondary | ICD-10-CM

## 2018-02-08 DIAGNOSIS — R293 Abnormal posture: Secondary | ICD-10-CM

## 2018-02-08 DIAGNOSIS — R2689 Other abnormalities of gait and mobility: Secondary | ICD-10-CM | POA: Diagnosis not present

## 2018-02-08 NOTE — Therapy (Signed)
[facility] [address] [place], [name], [address] Phone: [phone]   Fax:  [phone]  Physical Therapy Treatment  Patient Details  Name: Alan Beltran MRN: [id number] Date of Birth: 1934/11/28 Referring Provider (PT): Dr. Vicie Mutters, MD   Encounter Date: 02/08/2018  PT End of Session - 02/08/18 1940    Visit Number  6    Number of Visits  17    Date for PT Re-Evaluation  03/09/18    Authorization Type  Medicare + [employer]    PT Start Time  3846    PT Stop Time  1615    PT Time Calculation (min)  45 min    Activity Tolerance  Patient tolerated treatment well    Behavior During Therapy  [facility] for tasks assessed/performed       Past Medical History:  Diagnosis Date  . Asthma   . Diabetes mellitus     Past Surgical History:  Procedure Laterality Date  . EXTERNAL EAR SURGERY    . FOOT SURGERY      There were no vitals filed for this visit.  Subjective Assessment - 02/08/18 1933    Subjective  Pt relays he hopes to get back to golf and driving    Pertinent History  DM, hearing loss, neuropathy    Patient Stated Goals  "to get my balance to where I feel stronger - I want to get back to playing golf"    Currently in Pain?  No/denies                       [facility] Adult PT Treatment/Exercise - 02/08/18 0001      Transfers   Transfers  Sit to Stand;Floor to Transfer    Sit to Stand  5: Supervision;With upper extremity assist;With armrests;From chair/3-in-1    Sit to Stand Details  --   X 10 spread out during session   Floor to Transfer  4: Min assist;With upper extremity assist    Floor to Transfer Details  Tactile cues for initiation;Tactile cues for sequencing;Tactile cues for weight shifting;Tactile cues for placement;Tactile cues for weight beaing;Visual cues for safe use of DME/AE;Visual cues/gestures for precautions/safety;Visual cues/gestures for sequencing;Verbal cues for  sequencing;Verbal cues for technique;Verbal cues for precautions/safety;Verbal cues for gait pattern;Manual facilitation for weight shifting;Manual facilitation for placement    Comments  Floor transfer needed demo multiple times and needed min A to come from sidelying up to knees with UE support on chair then he was able to achieve 1/2 kneeling and push up onto chair and stand with supervision      Ambulation/Gait   Ambulation/Gait  Yes    Ambulation/Gait Assistance  5: Supervision    Ambulation Distance (Feet)  200 Feet    Assistive device  None      High Level Balance   High Level Balance Comments  // bars march walk, retro walk, sidestepping up/down X 3 then balance on airex with trunk rotations X 15 bilat, then came off Ax and did resisted golf simulated swing with red band X 15 front swing, and X 15 back swing with no LOB noted      Exercises   Exercises  Knee/Hip      Knee/Hip Exercises: Aerobic   Nustep  L3 X 5 min UE/LE for endurance               PT Short Term Goals - 02/02/18 1114  PT SHORT TERM GOAL #1   Title  patient to be independent with initial HEP for balance    Time  4    Period  Weeks    Status  Partially Met      PT SHORT TERM GOAL #2   Title  Assessment of SOT with goal establishment as indicated    Baseline  01/27/18 composite score 56    Time  4    Period  Weeks    Status  Achieved      PT SHORT TERM GOAL #3   Title  patient to improve Berg by >/= 6 points demonstrating reduced fall risk    Baseline  IMproved from 35/56 to 46/56.    Time  4    Period  Weeks    Status  Achieved      PT SHORT TERM GOAL #4   Title  patient to improve gait speed to >/= 2.62 ft/sec demosnstraing improved functional mobility    Baseline  Gait speed=2.05 ft/sec at eval and 1.92 ft/sec today.    Time  4    Period  Weeks    Status  Not Met        PT Long Term Goals - 02/02/18 1233      PT LONG TERM GOAL #1   Title  patient to be independent with advanced  HEP. (LTG date 03/09/2018)    Time  8    Period  Weeks    Status  On-going      PT LONG TERM GOAL #2   Title  patient to improve Berg to >/= 48/56.    Baseline  Revised/updated 02/02/2018 with patient scoring 46/56.    Time  8    Period  Weeks    Status  Revised      PT LONG TERM GOAL #3   Title  patient to improve gait speed to >/= 2.6 ft/sec    Baseline  Revised/updated 02/02/2018 due to STG performance.    Time  8    Period  Weeks    Status  Revised      PT LONG TERM GOAL #4   Title  patient to demonstrate gait over various levels and surfaces with IND with LRAD with no LOB or overt instability for >/= 500 feet    Time  8    Period  Weeks    Status  On-going      PT LONG TERM GOAL #5   Title  patient to verbalize 3 fall prevention strategies    Time  8    Period  Weeks    Status  On-going      PT LONG TERM GOAL #6   Title  Patient will improve composite SOT score to 60     Baseline  01/27/18  56    Status  On-going            Plan - 02/08/18 1944    Clinical Impression Statement  Session focused on balance training and endurance today for LE and standing tolerance. He had no complaints today but does fatique quickly and needed multiple rest breaks. He was able to perform resisted golf swinging today holding onto theraband handles to simulate golf club swing and he did show good balance with this.FLoor transfer practiced today with him needing max cuing and min assist overall to return back from floor to chair. PT will continue to progress his balance, gait, strength and endurance as able.  Rehab Potential  Good    PT Frequency  2x / week    PT Duration  8 weeks    PT Treatment/Interventions  ADLs/Self Care Home Management;DME Instruction;Balance training;Therapeutic exercise;Therapeutic activities;Functional mobility training;Stair training;Gait training;Neuromuscular re-education;Patient/family education;Vestibular;Taping;Manual techniques    PT Next Visit Plan  Work  on ankle versus hip strategy versus step strategy; compliant surfaces with EO and EC; trunk rotation and weight shifting for pre-putting and pre-golf work.Floor tranfer    Consulted and Agree with Plan of Care  Patient       Patient will benefit from skilled therapeutic intervention in order to improve the following deficits and impairments:  Abnormal gait, Decreased activity tolerance, Decreased balance, Difficulty walking, Decreased mobility, Postural dysfunction  Visit Diagnosis: Unsteadiness on feet  Other abnormalities of gait and mobility  Abnormal posture     Problem List Patient Active Problem List   Diagnosis Date Noted  . Diabetes mellitus 09/04/2010    Debbe Odea ,PT,DPT 02/08/2018, 7:51 PM  [facility] [address] [place], [name], [address] Phone: [phone]   Fax:  [phone]  Name: Tayt Moyers MRN: [id number] Date of Birth: 08-24-1934

## 2018-02-11 ENCOUNTER — Ambulatory Visit: Payer: Medicare Other | Admitting: Rehabilitation

## 2018-02-11 ENCOUNTER — Encounter: Payer: Self-pay | Admitting: Rehabilitation

## 2018-02-11 DIAGNOSIS — R2681 Unsteadiness on feet: Secondary | ICD-10-CM | POA: Diagnosis not present

## 2018-02-11 DIAGNOSIS — R293 Abnormal posture: Secondary | ICD-10-CM

## 2018-02-11 DIAGNOSIS — R2689 Other abnormalities of gait and mobility: Secondary | ICD-10-CM

## 2018-02-11 NOTE — Therapy (Signed)
[facility] [address] [place], [name], [address] Phone: [phone]   Fax:  [phone]  Physical Therapy Treatment  Patient Details  Name: Alan Beltran MRN: [id number] Date of Birth: 05/12/1934 Referring Provider (PT): Dr. Vicie Mutters, MD   Encounter Date: 02/11/2018  PT End of Session - 02/11/18 1408    Visit Number  7    Number of Visits  17    Date for PT Re-Evaluation  03/09/18    Authorization Type  Medicare + [employer]    PT Start Time  1402    PT Stop Time  1445    PT Time Calculation (min)  43 min    Activity Tolerance  Patient tolerated treatment well    Behavior During Therapy  [facility] for tasks assessed/performed       Past Medical History:  Diagnosis Date  . Asthma   . Diabetes mellitus     Past Surgical History:  Procedure Laterality Date  . EXTERNAL EAR SURGERY    . FOOT SURGERY      There were no vitals filed for this visit.  Subjective Assessment - 02/11/18 1408    Subjective  Pt reports feeling weak today, like legs cant carry him.     Pertinent History  DM, hearing loss, neuropathy    Patient Stated Goals  "to get my balance to where I feel stronger - I want to get back to playing golf"    Currently in Pain?  No/denies                       [facility] Adult PT Treatment/Exercise - 02/11/18 1405      Ambulation/Gait   Ambulation/Gait  Yes    Ambulation/Gait Assistance  5: Supervision    Ambulation/Gait Assistance Details  Pt ambulatory to back of clinic without AD at S level.  Cues for upright posture, improved arm swing and heel to toe contact.      Ambulation Distance (Feet)  100 Feet    Assistive device  None      High Level Balance   High Level Balance Activities  Other (comment)    High Level Balance Comments  Standing on foam airex with feet apart EO x 20 secs, feet apart with EO performing head turns x 10 reps, up/down x 10 reps progressing to feet together  EO x 20 secs, head turns each direction x 10 reps.  Note that with feet together EO and head motions pt needs intermittent UE support.  Marching on foam airex with BUE support>single UE support x 20 reps each.  Forward/retro stepping onto/over airex x 10 reps on each side with BUE>single UE support.   Facing // bars with single UE support performing lateral stepping with opposite reaching across body for balance, step strategy and trunk rotation x 5 reps each direction, lateral stepping with same side UE reach (as in PWR! step) x 10 reps total with single UE support again for stepping and posture.  Pt continued to need cues for upright posture throughout all balance tasks.  Progressed to standing on foam balance beam with feet apart EO maintaining balance x 2 sets of 20 secs, forward/retro stepping on R side x 10 reps then L side x 10 reps, while standing on foam beam PT providing external perturbations x 10 reps at varying locations to elicit improved hip and stepping strategy.        Self-Care   Self-Care  Other Self-Care Comments    Other Self-Care Comments   Discussion about community fitness in order to maintain/continue making gains with strength and endurance.  Pt reports that he does walk in neighborhood as it is paved but only does once a day.  He also reports that he does not get up until about lunch time and stays up until 2-3 am.  Encouraged adjusting sleep/wake cycle to allow more opportunity to do community/leisure fitness.  Pt seems reluctant to do so.               PT Education - 02/11/18 1408    Education Details  see self care    Person(s) Educated  Patient    Methods  Explanation    Comprehension  Verbalized understanding       PT Short Term Goals - 02/02/18 1114      PT SHORT TERM GOAL #1   Title  patient to be independent with initial HEP for balance    Time  4    Period  Weeks    Status  Partially Met      PT SHORT TERM GOAL #2   Title  Assessment of SOT with goal  establishment as indicated    Baseline  01/27/18 composite score 56    Time  4    Period  Weeks    Status  Achieved      PT SHORT TERM GOAL #3   Title  patient to improve Berg by >/= 6 points demonstrating reduced fall risk    Baseline  IMproved from 35/56 to 46/56.    Time  4    Period  Weeks    Status  Achieved      PT SHORT TERM GOAL #4   Title  patient to improve gait speed to >/= 2.62 ft/sec demosnstraing improved functional mobility    Baseline  Gait speed=2.05 ft/sec at eval and 1.92 ft/sec today.    Time  4    Period  Weeks    Status  Not Met        PT Long Term Goals - 02/02/18 1233      PT LONG TERM GOAL #1   Title  patient to be independent with advanced HEP. (LTG date 03/09/2018)    Time  8    Period  Weeks    Status  On-going      PT LONG TERM GOAL #2   Title  patient to improve Berg to >/= 48/56.    Baseline  Revised/updated 02/02/2018 with patient scoring 46/56.    Time  8    Period  Weeks    Status  Revised      PT LONG TERM GOAL #3   Title  patient to improve gait speed to >/= 2.6 ft/sec    Baseline  Revised/updated 02/02/2018 due to STG performance.    Time  8    Period  Weeks    Status  Revised      PT LONG TERM GOAL #4   Title  patient to demonstrate gait over various levels and surfaces with IND with LRAD with no LOB or overt instability for >/= 500 feet    Time  8    Period  Weeks    Status  On-going      PT LONG TERM GOAL #5   Title  patient to verbalize 3 fall prevention strategies    Time  8    Period  Weeks  Status  On-going      PT LONG TERM GOAL #6   Title  Patient will improve composite SOT score to 60     Baseline  01/27/18  56    Status  On-going            Plan - 02/11/18 1409    Clinical Impression Statement  Skilled session continues to focus on high level balance with emphasis on compliant surfaces, hip and stepping strategy.  Pt continues to require at least single UE support during exercises, but makes progress within  session.  Also continues to need extended seated rest breaks and encouragement to begin new task during session.     Rehab Potential  Good    PT Frequency  2x / week    PT Duration  8 weeks    PT Treatment/Interventions  ADLs/Self Care Home Management;DME Instruction;Balance training;Therapeutic exercise;Therapeutic activities;Functional mobility training;Stair training;Gait training;Neuromuscular re-education;Patient/family education;Vestibular;Taping;Manual techniques    PT Next Visit Plan  Work on ankle versus hip strategy versus step strategy; compliant surfaces with EO and EC; trunk rotation and weight shifting for pre-putting and pre-golf work.    Consulted and Agree with Plan of Care  Patient       Patient will benefit from skilled therapeutic intervention in order to improve the following deficits and impairments:  Abnormal gait, Decreased activity tolerance, Decreased balance, Difficulty walking, Decreased mobility, Postural dysfunction  Visit Diagnosis: Unsteadiness on feet  Other abnormalities of gait and mobility  Abnormal posture     Problem List Patient Active Problem List   Diagnosis Date Noted  . Diabetes mellitus 09/04/2010    Cameron Sprang, PT, MPT [facility] [address] [place], [name], [address] Phone: [phone]   Fax:  [phone] 02/11/18, 3:08 PM  Name: Alan Beltran MRN: [id number] Date of Birth: 1934/03/24

## 2018-02-15 DIAGNOSIS — E291 Testicular hypofunction: Secondary | ICD-10-CM | POA: Diagnosis not present

## 2018-02-15 DIAGNOSIS — E538 Deficiency of other specified B group vitamins: Secondary | ICD-10-CM | POA: Diagnosis not present

## 2018-02-16 ENCOUNTER — Encounter: Payer: Self-pay | Admitting: Rehabilitative and Restorative Service Providers"

## 2018-02-16 ENCOUNTER — Ambulatory Visit: Payer: Medicare Other | Admitting: Rehabilitative and Restorative Service Providers"

## 2018-02-16 DIAGNOSIS — R2689 Other abnormalities of gait and mobility: Secondary | ICD-10-CM

## 2018-02-16 DIAGNOSIS — R293 Abnormal posture: Secondary | ICD-10-CM | POA: Diagnosis not present

## 2018-02-16 DIAGNOSIS — R2681 Unsteadiness on feet: Secondary | ICD-10-CM

## 2018-02-16 NOTE — Therapy (Signed)
[facility] [address] [place], [name], [address] Phone: [phone]   Fax:  [phone]  Physical Therapy Treatment  Patient Details  Name: Alan Beltran MRN: [id number] Date of Birth: 04/10/1934 Referring Provider (PT): Dr. Vicie Mutters, MD   Encounter Date: 02/16/2018  PT End of Session - 02/16/18 1415    Visit Number  8    Number of Visits  17    Date for PT Re-Evaluation  03/09/18    Authorization Type  Medicare + [employer]    PT Start Time  1320    PT Stop Time  1405    PT Time Calculation (min)  45 min    Activity Tolerance  Patient tolerated treatment well    Behavior During Therapy  [facility] for tasks assessed/performed       Past Medical History:  Diagnosis Date  . Asthma   . Diabetes mellitus     Past Surgical History:  Procedure Laterality Date  . EXTERNAL EAR SURGERY    . FOOT SURGERY      There were no vitals filed for this visit.  Subjective Assessment - 02/16/18 1327    Subjective  The patient notes he is doing exercises "when I think about it."    Patient Stated Goals  "to get my balance to where I feel stronger - I want to get back to playing golf"    Currently in Pain?  No/denies                       [facility] Adult PT Treatment/Exercise - 02/16/18 1410      Ambulation/Gait   Ambulation/Gait  Yes    Ambulation/Gait Assistance  4: Min assist    Ambulation/Gait Assistance Details  PT emphasized dynamic balance challenges performing slow/fast gait, heel/toe walking, forward/backwards walking.  Transitioned to side stepping with min A with losses of balance and then 180 degree turns every 10 feet with min A for safety.      Neuro Re-ed    Neuro Re-ed Details   PT emphasized balance control and weight shifting performing standing posterior steps (blocked practice, then alternating), lateral sway R<>L  with emphasis on reaching taller for postural elongation, anterior stepping  emphasizing upright posture.  Patient required min A for all stepping activities.  Foam standing with reaching and with head motion with min A.  Rocker board working on upright posture iwth min to mod A and then resting hips against countertop and moving to upright standing to encourage lengthening through posture and hips.      Exercises   Exercises  Other Exercises    Other Exercises   SEATED:  hamstring stretching with cues on leg extension and tall posture.    SIT<>STAND emphasizing power with standing and upright posture x 10 reps.       Vestibular Treatment/Exercise - 02/16/18 1341      Vestibular Treatment/Exercise   Vestibular Treatment Provided  Gaze    Gaze Exercises  X1 Viewing Horizontal      X1 Viewing Horizontal   Foot Position  sitting    Reps  3    Comments  seated head turns with a review of technique as patient notes "I dont' get to this one at home."  Reviewed fixation on target.              PT Short Term Goals - 02/02/18 1114      PT SHORT TERM GOAL #  1   Title  patient to be independent with initial HEP for balance    Time  4    Period  Weeks    Status  Partially Met      PT SHORT TERM GOAL #2   Title  Assessment of SOT with goal establishment as indicated    Baseline  01/27/18 composite score 56    Time  4    Period  Weeks    Status  Achieved      PT SHORT TERM GOAL #3   Title  patient to improve Berg by >/= 6 points demonstrating reduced fall risk    Baseline  IMproved from 35/56 to 46/56.    Time  4    Period  Weeks    Status  Achieved      PT SHORT TERM GOAL #4   Title  patient to improve gait speed to >/= 2.62 ft/sec demosnstraing improved functional mobility    Baseline  Gait speed=2.05 ft/sec at eval and 1.92 ft/sec today.    Time  4    Period  Weeks    Status  Not Met        PT Long Term Goals - 02/02/18 1233      PT LONG TERM GOAL #1   Title  patient to be independent with advanced HEP. (LTG date 03/09/2018)    Time  8     Period  Weeks    Status  On-going      PT LONG TERM GOAL #2   Title  patient to improve Berg to >/= 48/56.    Baseline  Revised/updated 02/02/2018 with patient scoring 46/56.    Time  8    Period  Weeks    Status  Revised      PT LONG TERM GOAL #3   Title  patient to improve gait speed to >/= 2.6 ft/sec    Baseline  Revised/updated 02/02/2018 due to STG performance.    Time  8    Period  Weeks    Status  Revised      PT LONG TERM GOAL #4   Title  patient to demonstrate gait over various levels and surfaces with IND with LRAD with no LOB or overt instability for >/= 500 feet    Time  8    Period  Weeks    Status  On-going      PT LONG TERM GOAL #5   Title  patient to verbalize 3 fall prevention strategies    Time  8    Period  Weeks    Status  On-going      PT LONG TERM GOAL #6   Title  Patient will improve composite SOT score to 60     Baseline  01/27/18  56    Status  On-going            Plan - 02/16/18 1415    Clinical Impression Statement  The patient has tightness in hamstrings limiting upright posture and leading to flexed knee standing position.  PT working on dynamic gait and balance progressing difficulty of activities.  continue working to [employer].     PT Treatment/Interventions  ADLs/Self Care Home Management;DME Instruction;Balance training;Therapeutic exercise;Therapeutic activities;Functional mobility training;Stair training;Gait training;Neuromuscular re-education;Patient/family education;Vestibular;Taping;Manual techniques    PT Next Visit Plan  Hamstring stretching, stepping strategy, hip strategy, 180 degree turns, weight shifting for pre-putting or golf work (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     Consulted and Agree  with Plan of Care  Patient       Patient will benefit from skilled therapeutic intervention in order to improve the following deficits and impairments:  Abnormal gait, Decreased activity tolerance, Decreased balance, Difficulty walking,  Decreased mobility, Postural dysfunction  Visit Diagnosis: Unsteadiness on feet  Other abnormalities of gait and mobility  Abnormal posture     Problem List Patient Active Problem List   Diagnosis Date Noted  . Diabetes mellitus 09/04/2010    Layloni Fahrner , PT 02/16/2018, 2:16 PM  [facility] [address] [place], [name], [address] Phone: [phone]   Fax:  [phone]  Name: Alan Beltran MRN: [id number] Date of Birth: 17-Jul-1934

## 2018-02-18 ENCOUNTER — Encounter: Payer: Self-pay | Admitting: Physical Therapy

## 2018-02-18 ENCOUNTER — Ambulatory Visit: Payer: Medicare Other | Admitting: Physical Therapy

## 2018-02-18 DIAGNOSIS — R2681 Unsteadiness on feet: Secondary | ICD-10-CM

## 2018-02-18 DIAGNOSIS — R293 Abnormal posture: Secondary | ICD-10-CM

## 2018-02-18 DIAGNOSIS — R2689 Other abnormalities of gait and mobility: Secondary | ICD-10-CM

## 2018-02-18 NOTE — Patient Instructions (Signed)
Access Code: [email or web address]  URL: [email or web address]  Date: 02/18/2018  Prepared by: Barry Brunner   Exercises  Romberg Stance with Head Nods - 1 sets - - hold - 1x daily - 6x weekly  Seated Hamstring Stretch - 2 reps - 1 sets - 30 seconds hold - 1-2x daily - 7x weekly  Seated Hamstring Stretch with Chair - 2 reps - 1 sets - 30 seconds hold - 1-2x daily

## 2018-02-18 NOTE — Therapy (Signed)
[facility] [address] [place], [name], [address] Phone: [phone]   Fax:  [phone]  Physical Therapy Treatment  Patient Details  Name: Alan Beltran MRN: [id number] Date of Birth: 21-Aug-1934 Referring Provider (PT): Dr. Vicie Mutters, MD   Encounter Date: 02/18/2018  PT End of Session - 02/18/18 1531    Visit Number  9    Number of Visits  17    Date for PT Re-Evaluation  03/09/18    Authorization Type  Medicare + [employer]    PT Start Time  1532    PT Stop Time  1621    PT Time Calculation (min)  49 min    Activity Tolerance  Patient tolerated treatment well    Behavior During Therapy  [facility] for tasks assessed/performed       Past Medical History:  Diagnosis Date  . Asthma   . Diabetes mellitus     Past Surgical History:  Procedure Laterality Date  . EXTERNAL EAR SURGERY    . FOOT SURGERY      There were no vitals filed for this visit.  Subjective Assessment - 02/18/18 1532    Subjective  Doing exercises more often than not. Sometimes forget to do them. Does lay the booklet on his table where he eats to help him remember.     Pertinent History  DM, hearing loss, neuropathy    Patient Stated Goals  "to get my balance to where I feel stronger - I want to get back to playing golf"    Currently in Pain?  No/denies                       [facility] Adult PT Treatment/Exercise - 02/18/18 1625      Knee/Hip Exercises: Stretches   Active Hamstring Stretch  Both;4 reps;30 seconds    Active Hamstring Stretch Limitations  seated w/ heel on ground vs leg on chair in front of him (did best with #1 for RLE and #2 for LLE       Knee/Hip Exercises: Aerobic   Nustep  L4 x 4 minutes for warm-up prior to stretch; UE/LE      Gait training with head turns, sudden stops, 90/180 degree turns (180 causing dizziness), incr/decr velocity x 320 ft Vestibular Treatment/Exercise - 02/18/18 1625      Vestibular Treatment/Exercise   Vestibular Treatment Provided  Gaze    Gaze Exercises  X1 Viewing Horizontal;X1 Viewing Vertical      X1 Viewing Horizontal   Foot Position  sitting    Time  --   30 sec   Reps  3    Comments  vc for incr head turns and speed      X1 Viewing Vertical   Foot Position  sitting    Time  --   up to 20 sec and loses target   Reps  3    Comments  quickly reduces degree of head movment and requires nearly constant cues to keep head moving         Balance Exercises - 02/18/18 1627      Balance Exercises: Standing   Gait with Head Turns  Forward    Tandem Gait  Forward;Intermittent upper extremity support;2 reps    Retro Gait  3 reps    Sidestepping  2 reps   in mini-squat   Turning  Both   while walking 90 & 180 degrees; pt w/ dizziness  Heel Raises Limitations  x 10 light one hand support on counter    Toe Raise Limitations  x 20 light one hand support on counter        PT Education - 02/18/18 1631    Education Details  addition to HEP and clarifying technique for VOR x1    Person(s) Educated  Patient    Methods  Explanation;Demonstration;Verbal cues    Comprehension  Verbalized understanding;Returned demonstration;Verbal cues required;Need further instruction       PT Short Term Goals - 02/02/18 1114      PT SHORT TERM GOAL #1   Title  patient to be independent with initial HEP for balance    Time  4    Period  Weeks    Status  Partially Met      PT SHORT TERM GOAL #2   Title  Assessment of SOT with goal establishment as indicated    Baseline  01/27/18 composite score 56    Time  4    Period  Weeks    Status  Achieved      PT SHORT TERM GOAL #3   Title  patient to improve Berg by >/= 6 points demonstrating reduced fall risk    Baseline  IMproved from 35/56 to 46/56.    Time  4    Period  Weeks    Status  Achieved      PT SHORT TERM GOAL #4   Title  patient to improve gait speed to >/= 2.62 ft/sec demosnstraing improved  functional mobility    Baseline  Gait speed=2.05 ft/sec at eval and 1.92 ft/sec today.    Time  4    Period  Weeks    Status  Not Met        PT Long Term Goals - 02/02/18 1233      PT LONG TERM GOAL #1   Title  patient to be independent with advanced HEP. (LTG date 03/09/2018)    Time  8    Period  Weeks    Status  On-going      PT LONG TERM GOAL #2   Title  patient to improve Berg to >/= 48/56.    Baseline  Revised/updated 02/02/2018 with patient scoring 46/56.    Time  8    Period  Weeks    Status  Revised      PT LONG TERM GOAL #3   Title  patient to improve gait speed to >/= 2.6 ft/sec    Baseline  Revised/updated 02/02/2018 due to STG performance.    Time  8    Period  Weeks    Status  Revised      PT LONG TERM GOAL #4   Title  patient to demonstrate gait over various levels and surfaces with IND with LRAD with no LOB or overt instability for >/= 500 feet    Time  8    Period  Weeks    Status  On-going      PT LONG TERM GOAL #5   Title  patient to verbalize 3 fall prevention strategies    Time  8    Period  Weeks    Status  On-going      PT LONG TERM GOAL #6   Title  Patient will improve composite SOT score to 60     Baseline  01/27/18  56    Status  On-going            Plan -  02/18/18 1633    Clinical Impression Statement  Addressed hamstring tightness and added stretch to HEP. Patient's technique with VORx1 is improving, however continues to need cues for degree of head turn and speed. Pt reports he feels better and believes PT is helping him, He again mentions his love for playing golf.     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     Consulted and Agree with Plan of Care  Patient       Patient will benefit from skilled therapeutic intervention in order to improve the following deficits and impairments:  Abnormal gait, Decreased activity tolerance, Decreased balance, Difficulty walking, Decreased mobility, Postural dysfunction  Visit Diagnosis: Unsteadiness on feet  Other abnormalities of gait and mobility  Abnormal posture     Problem List Patient Active Problem List   Diagnosis Date Noted  . Diabetes mellitus 09/04/2010    Rexanne Mano, PT 02/18/2018, 4:36 PM  [facility] [address] [place], [name], [address] Phone: [phone]   Fax:  [phone]  Name: Alan Beltran MRN: [id number] Date of Birth: 1934/12/04

## 2018-02-21 ENCOUNTER — Ambulatory Visit: Payer: Medicare Other | Admitting: Rehabilitation

## 2018-02-21 ENCOUNTER — Encounter: Payer: Self-pay | Admitting: Rehabilitation

## 2018-02-21 DIAGNOSIS — R2689 Other abnormalities of gait and mobility: Secondary | ICD-10-CM

## 2018-02-21 DIAGNOSIS — R293 Abnormal posture: Secondary | ICD-10-CM | POA: Diagnosis not present

## 2018-02-21 DIAGNOSIS — R2681 Unsteadiness on feet: Secondary | ICD-10-CM | POA: Diagnosis not present

## 2018-02-21 NOTE — Therapy (Signed)
[facility] [address] [place], [place], [address] Phone: [phone]   Fax:  [phone]  Physical Therapy Treatment and Progress Note  Patient Details  Name: Alan Beltran MRN: [id number] Date of Birth: 01/29/1934 Referring Provider (PT): Dr. Eric Kraus, MD   Encounter Date: 02/21/2018  PT End of Session - 02/21/18 1457    Visit Number  10    Number of Visits  17    Date for PT Re-Evaluation  03/09/18    Authorization Type  Medicare + [employer]    PT Start Time  1407    PT Stop Time  1450    PT Time Calculation (min)  43 min    Activity Tolerance  Patient tolerated treatment well    Behavior During Therapy  [facility] for tasks assessed/performed       Past Medical History:  Diagnosis Date  . Asthma   . Diabetes mellitus     Past Surgical History:  Procedure Laterality Date  . EXTERNAL EAR SURGERY    . FOOT SURGERY      There were no vitals filed for this visit.  Subjective Assessment - 02/21/18 1412    Subjective  Pt reporting, "I'm still staggering some."     Pertinent History  DM, hearing loss, neuropathy    Patient Stated Goals  "to get my balance to where I feel stronger - I want to get back to playing golf"    Currently in Pain?  No/denies                       [facility] Adult PT Treatment/Exercise - 02/21/18 1410      High Level Balance   High Level Balance Activities  Side stepping;Braiding;Backward walking;Direction changes;Turns;Head turns;Marching forwards    High Level Balance Comments  In hallway using approx 50' stretches at a time, performed all activities listed above.  Also performed braiding and side stepping but with UE support from PT for safety with cues for sequencing with braiding.  Pt tolerated well, did report slight dizziness with vertical head turns.  Continued with having pt make 180 deg turns attempting to to 1-2 steps only x 10 reps in each direction with UE  support in front and behind for safety.  Continued with stepping strategy activity stepping over two barriers then turning and going over again. Performed x 10 reps.  No overt LOB but does need cues for leading with LE towards turn to avoid narrow BOS/crossing over.  Also performed side stepping over barriers.  Needs moderate cuing to keep feet pointed forward and to take wider steps to avoid stepping on barrier or on feet.  Performed x 10 reps each direction. Ended session with simulated golfing task while standing on blue therapy mat for increased challenge and to better simulate uneven greens.  Performed putting initially actually putting ball then going to retrieve ball and replacing again to hit.  Performed x 8 reps.  Then had pt get iron to practice bigger swing (did not hit ball) to work on larger weight shift.  Pt only had one LOB during golf simulated tasks posteriorly, but did not elicit stepping strategy to correct needing min A.              PT Education - 02/21/18 1413    Person(s) Educated  --    Methods  --    Comprehension  --       PT Short   Term Goals - 02/02/18 1114      PT SHORT TERM GOAL #1   Title  patient to be independent with initial HEP for balance    Time  4    Period  Weeks    Status  Partially Met      PT SHORT TERM GOAL #2   Title  Assessment of SOT with goal establishment as indicated    Baseline  01/27/18 composite score 56    Time  4    Period  Weeks    Status  Achieved      PT SHORT TERM GOAL #3   Title  patient to improve Berg by >/= 6 points demonstrating reduced fall risk    Baseline  IMproved from 35/56 to 46/56.    Time  4    Period  Weeks    Status  Achieved      PT SHORT TERM GOAL #4   Title  patient to improve gait speed to >/= 2.62 ft/sec demosnstraing improved functional mobility    Baseline  Gait speed=2.05 ft/sec at eval and 1.92 ft/sec today.    Time  4    Period  Weeks    Status  Not Met        PT Long Term Goals - 02/02/18  1233      PT LONG TERM GOAL #1   Title  patient to be independent with advanced HEP. (LTG date 03/09/2018)    Time  8    Period  Weeks    Status  On-going      PT LONG TERM GOAL #2   Title  patient to improve Berg to >/= 48/56.    Baseline  Revised/updated 02/02/2018 with patient scoring 46/56.    Time  8    Period  Weeks    Status  Revised      PT LONG TERM GOAL #3   Title  patient to improve gait speed to >/= 2.6 ft/sec    Baseline  Revised/updated 02/02/2018 due to STG performance.    Time  8    Period  Weeks    Status  Revised      PT LONG TERM GOAL #4   Title  patient to demonstrate gait over various levels and surfaces with IND with LRAD with no LOB or overt instability for >/= 500 feet    Time  8    Period  Weeks    Status  On-going      PT LONG TERM GOAL #5   Title  patient to verbalize 3 fall prevention strategies    Time  8    Period  Weeks    Status  On-going      PT LONG TERM GOAL #6   Title  Patient will improve composite SOT score to 60     Baseline  01/27/18  56    Status  On-going         Progress Note Reporting Period 12/29/2017 to 02/21/2018  See note below for Objective Data and Assessment of Progress/Goals.         Plan - 02/21/18 1458    Clinical Impression Statement  Skilled session continues to focus on high level gait with direction changes, head turns, sudden stops and backwards gait.  Also initiated putting/chipping swing on compliant surface to address balance and weight shift with simulated golfing.  Pt with only single posterior LOB but did not elicit stepping strategy to correct, needing min A to  correct.     Rehab Potential  Good    PT Frequency  2x / week    PT Duration  8 weeks    PT Treatment/Interventions  ADLs/Self Care Home Management;DME Instruction;Balance training;Therapeutic exercise;Therapeutic activities;Functional mobility training;Stair training;Gait training;Neuromuscular re-education;Patient/family  education;Vestibular;Taping;Manual techniques    PT Next Visit Plan  ?review hamstring stretches added last visit Raquel Sarna forgot to go over with him, but he did recall doing stretch); stepping strategy, hip strategy, 180 degree turns, weight shifting for pre-putting or golf work (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     Consulted and Agree with Plan of Care  Patient       Patient will benefit from skilled therapeutic intervention in order to improve the following deficits and impairments:  Abnormal gait, Decreased activity tolerance, Decreased balance, Difficulty walking, Decreased mobility, Postural dysfunction  Visit Diagnosis: Unsteadiness on feet  Other abnormalities of gait and mobility  Abnormal posture     Problem List Patient Active Problem List   Diagnosis Date Noted  . Diabetes mellitus 09/04/2010    Rinaldo Cloud Betha Loa 02/21/2018, 3:07 PM  [facility] [address] [place], [name], [address] Phone: [phone]   Fax:  [phone]  Name: Gibson Lad MRN: [id number] Date of Birth: 28-Oct-1934

## 2018-02-22 ENCOUNTER — Ambulatory Visit: Payer: Medicare Other | Admitting: Physical Therapy

## 2018-02-22 DIAGNOSIS — L249 Irritant contact dermatitis, unspecified cause: Secondary | ICD-10-CM | POA: Diagnosis not present

## 2018-02-23 ENCOUNTER — Encounter: Payer: Self-pay | Admitting: Rehabilitative and Restorative Service Providers"

## 2018-02-23 ENCOUNTER — Ambulatory Visit: Payer: Medicare Other | Admitting: Rehabilitative and Restorative Service Providers"

## 2018-02-23 DIAGNOSIS — R293 Abnormal posture: Secondary | ICD-10-CM | POA: Diagnosis not present

## 2018-02-23 DIAGNOSIS — R2689 Other abnormalities of gait and mobility: Secondary | ICD-10-CM

## 2018-02-23 DIAGNOSIS — R2681 Unsteadiness on feet: Secondary | ICD-10-CM | POA: Diagnosis not present

## 2018-02-23 NOTE — Therapy (Signed)
[facility] [address] [place], [name], [address] Phone: [phone]   Fax:  [phone]  Physical Therapy Treatment  Patient Details  Name: Alan Beltran MRN: [id number] Date of Birth: 1934-07-21 Referring Provider (PT): Dr. Vicie Mutters, MD   Encounter Date: 02/23/2018  PT End of Session - 02/23/18 1327    Visit Number  11    Number of Visits  17    Date for PT Re-Evaluation  03/09/18    Authorization Type  Medicare + [employer]    PT Start Time  1321    PT Stop Time  1406    PT Time Calculation (min)  45 min    Equipment Utilized During Treatment  Gait belt    Activity Tolerance  Patient tolerated treatment well    Behavior During Therapy  [facility] for tasks assessed/performed       Past Medical History:  Diagnosis Date  . Asthma   . Diabetes mellitus     Past Surgical History:  Procedure Laterality Date  . EXTERNAL EAR SURGERY    . FOOT SURGERY      There were no vitals filed for this visit.  Subjective Assessment - 02/23/18 1324    Subjective  The patient notes his seborrheic dermatitis has been bothering him and he has not been able to shave.  He notes he has not been doing his home exercises regularly this week due to dermatitis.    Patient is accompained by:  Family member    Patient Stated Goals  "to get my balance to where I feel stronger - I want to get back to playing golf"    Currently in Pain?  No/denies                       [facility] Adult PT Treatment/Exercise - 02/23/18 1338      Ambulation/Gait   Ambulation/Gait  Yes    Ambulation/Gait Assistance  4: Min guard;5: Supervision    Ambulation/Gait Assistance Details  PT encouraged faster pace and larger stride length.    Ambulation Distance (Feet)  350 Feet   x 5 reps   Assistive device  None    Gait Pattern  Step-through pattern;Decreased stride length;Trunk flexed    Ambulation Surface  Level;Indoor    Gait velocity   2.59 ft/sec    Gait Comments  The patient has shortness of breath with walking with HR=114 bpm, and 96% spO2.  He recovers with rest x 1 minute.  The patient walked outdoors on grass and rubber mulch with SBA to  CGA without loss of balance.  Walked x 75 feet x 2 reps in grass.  Dynamic gait activities performing forward, backward walking, toe walking, heel walking with CGA for safety.      Neuro Re-ed    Neuro Re-ed Details   Turning in place leads to increased dizziness x seconds.  PT encouraged  patient to take larger steps for turns.  Marching in place, sidestepping with min A.        Exercises   Exercises  Other Exercises    Other Exercises   reviewed hamstring stretching seated x 3 reps each side.               PT Short Term Goals - 02/02/18 1114      PT SHORT TERM GOAL #1   Title  patient to be independent with initial HEP for balance    Time  4    Period  Weeks    Status  Partially Met      PT SHORT TERM GOAL #2   Title  Assessment of SOT with goal establishment as indicated    Baseline  01/27/18 composite score 56    Time  4    Period  Weeks    Status  Achieved      PT SHORT TERM GOAL #3   Title  patient to improve Berg by >/= 6 points demonstrating reduced fall risk    Baseline  IMproved from 35/56 to 46/56.    Time  4    Period  Weeks    Status  Achieved      PT SHORT TERM GOAL #4   Title  patient to improve gait speed to >/= 2.62 ft/sec demosnstraing improved functional mobility    Baseline  Gait speed=2.05 ft/sec at eval and 1.92 ft/sec today.    Time  4    Period  Weeks    Status  Not Met        PT Long Term Goals - 02/02/18 1233      PT LONG TERM GOAL #1   Title  patient to be independent with advanced HEP. (LTG date 03/09/2018)    Time  8    Period  Weeks    Status  On-going      PT LONG TERM GOAL #2   Title  patient to improve Berg to >/= 48/56.    Baseline  Revised/updated 02/02/2018 with patient scoring 46/56.    Time  8    Period  Weeks     Status  Revised      PT LONG TERM GOAL #3   Title  patient to improve gait speed to >/= 2.6 ft/sec    Baseline  Revised/updated 02/02/2018 due to STG performance.    Time  8    Period  Weeks    Status  Revised      PT LONG TERM GOAL #4   Title  patient to demonstrate gait over various levels and surfaces with IND with LRAD with no LOB or overt instability for >/= 500 feet    Time  8    Period  Weeks    Status  On-going      PT LONG TERM GOAL #5   Title  patient to verbalize 3 fall prevention strategies    Time  8    Period  Weeks    Status  On-going      PT LONG TERM GOAL #6   Title  Patient will improve composite SOT score to 60     Baseline  01/27/18  56    Status  On-going            Plan - 02/23/18 1953    Clinical Impression Statement  The patient is subjectively reporting improvement in confidence and mobility.  He was able to negotiate community surfaces (grass and rubber mulch) today with CGA without a loss of balance.  He inquired about being able to hit golf balls at a driving range.  PT discussed fall risk with being on unlevel surfaces.  He notes he can ride a cart out near driving range.and have a shorter distance to walk.  PT recommends we continue working on compliant surface negotiation and weight shifting before he attempts this by himself.     PT Treatment/Interventions  ADLs/Self Care Home Management;DME Instruction;Balance training;Therapeutic exercise;Therapeutic activities;Functional mobility training;Stair training;Gait training;Neuromuscular re-education;Patient/family  education;Vestibular;Taping;Manual techniques    PT Next Visit Plan  Compliant surface standing, turning in place, stepping strategies, weight shifting, golf swing for balance and considering ability to perform this for recreation.    Consulted and Agree with Plan of Care  Patient       Patient will benefit from skilled therapeutic intervention in order to improve the following deficits  and impairments:  Abnormal gait, Decreased activity tolerance, Decreased balance, Difficulty walking, Decreased mobility, Postural dysfunction  Visit Diagnosis: Unsteadiness on feet  Other abnormalities of gait and mobility  Abnormal posture     Problem List Patient Active Problem List   Diagnosis Date Noted  . Diabetes mellitus 09/04/2010    Allison Deshotels, PT 02/23/2018, 8:06 PM  [facility] [address] [employer] [place], [name], [address] Phone: [phone]   Fax:  [phone]  Name: Chevy Sweigert MRN: [id number] Date of Birth: 05/31/1934

## 2018-02-25 ENCOUNTER — Ambulatory Visit: Payer: Medicare Other | Admitting: Physical Therapy

## 2018-03-02 ENCOUNTER — Encounter: Payer: Self-pay | Admitting: Rehabilitative and Restorative Service Providers"

## 2018-03-02 ENCOUNTER — Ambulatory Visit: Payer: Medicare Other | Attending: Family Medicine | Admitting: Rehabilitative and Restorative Service Providers"

## 2018-03-02 VITALS — BP 117/80 | HR 107

## 2018-03-02 DIAGNOSIS — R2689 Other abnormalities of gait and mobility: Secondary | ICD-10-CM

## 2018-03-02 DIAGNOSIS — H8113 Benign paroxysmal vertigo, bilateral: Secondary | ICD-10-CM | POA: Insufficient documentation

## 2018-03-02 DIAGNOSIS — E538 Deficiency of other specified B group vitamins: Secondary | ICD-10-CM | POA: Diagnosis not present

## 2018-03-02 DIAGNOSIS — R2681 Unsteadiness on feet: Secondary | ICD-10-CM | POA: Diagnosis not present

## 2018-03-02 DIAGNOSIS — R42 Dizziness and giddiness: Secondary | ICD-10-CM | POA: Insufficient documentation

## 2018-03-02 DIAGNOSIS — R293 Abnormal posture: Secondary | ICD-10-CM | POA: Insufficient documentation

## 2018-03-02 DIAGNOSIS — E291 Testicular hypofunction: Secondary | ICD-10-CM | POA: Diagnosis not present

## 2018-03-03 NOTE — Therapy (Signed)
[facility] [address] [place], [name], [address] Phone: [phone]   Fax:  [phone]  Physical Therapy Treatment  Patient Details  Name: Alan Beltran MRN: [id number] Date of Birth: 1934-05-14 Referring Provider (PT): Dr. Vicie Mutters, MD   Encounter Date: 03/02/2018  PT End of Session - 03/03/18 1131    Visit Number  12    Number of Visits  17    Date for PT Re-Evaluation  03/09/18    Authorization Type  Medicare + [employer]    PT Start Time  1325    PT Stop Time  1405    PT Time Calculation (min)  40 min    Equipment Utilized During Treatment  Gait belt    Activity Tolerance  Patient tolerated treatment well    Behavior During Therapy  [facility] for tasks assessed/performed       Past Medical History:  Diagnosis Date  . Asthma   . Diabetes mellitus     Past Surgical History:  Procedure Laterality Date  . EXTERNAL EAR SURGERY    . FOOT SURGERY      Vitals:   03/02/18 1333  BP: 117/80  Pulse: (!) 107    Subjective Assessment - 03/02/18 1327    Subjective  The patient reports that he is not feeling as well today (not one particular thing, describes general malaise).      Pertinent History  DM, hearing loss, neuropathy    Patient Stated Goals  "to get my balance to where I feel stronger - I want to get back to playing golf"    Currently in Pain?  No/denies             Vestibular Assessment - 03/02/18 1357      Positional Testing   Sidelying Test  Sidelying Right;Sidelying Left   patient noted dizziness getting up from chest stretch     Sidelying Right   Sidelying Right Duration  10-15 seconds    Sidelying Right Symptoms  Upbeat, right rotatory nystagmus   low amplitude nystagmus viewed in room light     Sidelying Left   Sidelying Left Duration  none    Sidelying Left Symptoms  No nystagmus               [facility] Adult PT Treatment/Exercise - 03/02/18 1331      Ambulation/Gait   Ambulation/Gait  Yes    Ambulation/Gait Assistance  4: Min guard;5: Supervision    Ambulation/Gait Assistance Details  patient with greater shortness of breath today with walking one lap hear shortness of breath    Ambulation Distance (Feet)  230 Feet   345, 230 x 2 reps   Assistive device  None    Gait Pattern  Step-through pattern;Decreased stride length;Trunk flexed    Ambulation Surface  Level;Indoor    Gait Comments  Dynamic gait walking forwards/backwards, toe walking with CGA and direction changes.        Neuro Re-ed    Neuro Re-ed Details   Standing on foam working on postural stabilization using a thraband performing scapular retraction x 10 reps.  Lateral weight shifting reaching upward to a wall on R and then L working on postural elongation.  Standing working on large amplitude movements stepping diagonally/posteriorly to R and L while reaching for postural alignment.  Patinet requires min A for challenging NMR activities.  Worked on turning in place with figure 8 turns and 360 degree turns with patient able to  perform with supervision.      Exercises   Exercises  Other Exercises    Other Exercises   supine towel roll stretch x 2 minutes       Vestibular Treatment/Exercise - 03/03/18 1129      Vestibular Treatment/Exercise   Vestibular Treatment Provided  Habituation    Habituation Exercises  Alan Beltran   Number of Reps   2    Symptom Description   Discovered at end of session and patient had appointment at [facility] for injection.    Did not perform CRT today.  Symptoms fatigued with second rep.              PT Short Term Goals - 02/02/18 1114      PT SHORT TERM GOAL #1   Title  patient to be independent with initial HEP for balance    Time  4    Period  Weeks    Status  Partially Met      PT SHORT TERM GOAL #2   Title  Assessment of SOT with goal establishment as indicated    Baseline  01/27/18 composite score 56    Time  4     Period  Weeks    Status  Achieved      PT SHORT TERM GOAL #3   Title  patient to improve Berg by >/= 6 points demonstrating reduced fall risk    Baseline  IMproved from 35/56 to 46/56.    Time  4    Period  Weeks    Status  Achieved      PT SHORT TERM GOAL #4   Title  patient to improve gait speed to >/= 2.62 ft/sec demosnstraing improved functional mobility    Baseline  Gait speed=2.05 ft/sec at eval and 1.92 ft/sec today.    Time  4    Period  Weeks    Status  Not Met        PT Long Term Goals - 02/02/18 1233      PT LONG TERM GOAL #1   Title  patient to be independent with advanced HEP. (LTG date 03/09/2018)    Time  8    Period  Weeks    Status  On-going      PT LONG TERM GOAL #2   Title  patient to improve Berg to >/= 48/56.    Baseline  Revised/updated 02/02/2018 with patient scoring 46/56.    Time  8    Period  Weeks    Status  Revised      PT LONG TERM GOAL #3   Title  patient to improve gait speed to >/= 2.6 ft/sec    Baseline  Revised/updated 02/02/2018 due to STG performance.    Time  8    Period  Weeks    Status  Revised      PT LONG TERM GOAL #4   Title  patient to demonstrate gait over various levels and surfaces with IND with LRAD with no LOB or overt instability for >/= 500 feet    Time  8    Period  Weeks    Status  On-going      PT LONG TERM GOAL #5   Title  patient to verbalize 3 fall prevention strategies    Time  8    Period  Weeks    Status  On-going      PT LONG TERM GOAL #6  Title  Patient will improve composite SOT score to 60     Baseline  01/27/18  56    Status  On-going            Plan - 03/03/18 1132    Clinical Impression Statement  The patient's LTGs are due to assess by 01/07/2019.  PT anticipates a need for 4 more weeks (consider reducing frequency to 1x/week) to work on Community education officer, addressing R BPPV, continuing to progress balance activities.   The patient's initial goal was to return to golfing,  which he has not participated with in years.  He has done well in the clinic with weight shift for dynamic tasks, however negotiating unlevel surfaces near putting green or hitting range would be more challenging.  PT to check LTGs next week.    PT Treatment/Interventions  ADLs/Self Care Home Management;DME Instruction;Balance training;Therapeutic exercise;Therapeutic activities;Functional mobility training;Stair training;Gait training;Neuromuscular re-education;Patient/family education;Vestibular;Taping;Manual techniques    PT Next Visit Plan  CHECK LTGS, Compliant surface standing, turning in place, stepping strategies, weight shifting, golf swing for balance and considering ability to perform this for recreation.    Consulted and Agree with Plan of Care  Patient       Patient will benefit from skilled therapeutic intervention in order to improve the following deficits and impairments:  Abnormal gait, Decreased activity tolerance, Decreased balance, Difficulty walking, Decreased mobility, Postural dysfunction  Visit Diagnosis: Unsteadiness on feet  Other abnormalities of gait and mobility  Abnormal posture     Problem List Patient Active Problem List   Diagnosis Date Noted  . Diabetes mellitus 09/04/2010    Paymon Rosensteel , PT 03/03/2018, 11:40 AM  [facility] [address] [place], [name], [address] Phone: [phone]   Fax:  [phone]  Name: Alan Beltran MRN: [id number] Date of Birth: Oct 10, 1934

## 2018-03-04 ENCOUNTER — Ambulatory Visit: Payer: Medicare Other | Admitting: Physical Therapy

## 2018-03-04 ENCOUNTER — Encounter: Payer: Self-pay | Admitting: Physical Therapy

## 2018-03-04 DIAGNOSIS — R293 Abnormal posture: Secondary | ICD-10-CM | POA: Diagnosis not present

## 2018-03-04 DIAGNOSIS — R2681 Unsteadiness on feet: Secondary | ICD-10-CM | POA: Diagnosis not present

## 2018-03-04 DIAGNOSIS — R42 Dizziness and giddiness: Secondary | ICD-10-CM

## 2018-03-04 DIAGNOSIS — R2689 Other abnormalities of gait and mobility: Secondary | ICD-10-CM

## 2018-03-04 DIAGNOSIS — H8113 Benign paroxysmal vertigo, bilateral: Secondary | ICD-10-CM

## 2018-03-05 NOTE — Therapy (Addendum)
[facility] [address] [place], [name], [address] Phone: [phone]   Fax:  [phone]  Physical Therapy Treatment  Patient Details  Name: Alan Beltran MRN: [id number] Date of Birth: 1934/10/01 Referring Provider (PT): Dr. Vicie Mutters, MD   Encounter Date: 03/04/2018  PT End of Session - 03/04/18 1528    Visit Number  13    Number of Visits  35   Date for PT Re-Evaluation  05/03/18   Authorization Type  Medicare + [employer]    PT Start Time  1529    PT Stop Time  1628    PT Time Calculation (min)  59 min    Activity Tolerance  Patient tolerated treatment well    Behavior During Therapy  [facility] for tasks assessed/performed       Past Medical History:  Diagnosis Date  . Asthma   . Diabetes mellitus     Past Surgical History:  Procedure Laterality Date  . EXTERNAL EAR SURGERY    . FOOT SURGERY      There were no vitals filed for this visit.  Subjective Assessment - 03/04/18 1529    Subjective  Today I'm feeling better than I was.     Pertinent History  DM, hearing loss, neuropathy    Patient Stated Goals  "to get my balance to where I feel stronger - I want to get back to playing golf"    Currently in Pain?  No/denies             Vestibular Assessment - 03/05/18 0001      Balancemaster   IT consultant Composite balance score: 60 compared to age/height normative value of 63  Patient demonstrated scores of:  99 for use of somatosensory feedback for balance (age/height normative value of 80),  80 for use of visual feedback for balance (age/height normative value of 74),   65 for use of vestibular feedback for balance (age/height normative value of 50).   COG readings were right and posterior to true center.   Condition 1: WNL 3 trials Condition 2: WNL 3 trials Condition 3: below normal 3 trials Condition 4: below normal 3  trials Condition 5: WNL 2 trials, below normal 1 trial Condition 6: fall x 1 trial; WNL 2 trials                  PT Education - 03/05/18 0849    Education Details  results of LTG assessment; overall good progress; pt has goal to return to playing golf and unclear if this goal can be met, but must do his HEP to continuce to improve    Person(s) Educated  Patient    Methods  Explanation    Comprehension  Verbalized understanding       PT Short Term Goals - 02/02/18 1114      PT SHORT TERM GOAL #1   Title  patient to be independent with initial HEP for balance    Time  4    Period  Weeks    Status  Partially Met      PT SHORT TERM GOAL #2   Title  Assessment of SOT with goal establishment as indicated    Baseline  01/27/18 composite score 56    Time  4    Period  Weeks    Status  Achieved      PT SHORT TERM  GOAL #3   Title  patient to improve Berg by >/= 6 points demonstrating reduced fall risk    Baseline  IMproved from 35/56 to 46/56.    Time  4    Period  Weeks    Status  Achieved      PT SHORT TERM GOAL #4   Title  patient to improve gait speed to >/= 2.62 ft/sec demosnstraing improved functional mobility    Baseline  Gait speed=2.05 ft/sec at eval and 1.92 ft/sec today.    Time  4    Period  Weeks    Status  Not Met        PT Long Term Goals - 03/04/18 1533      PT LONG TERM GOAL #1   Title  patient to be independent with advanced HEP. (LTG date 03/09/2018)    Time  8    Period  Weeks    Status  On-going      PT LONG TERM GOAL #2   Title  patient to improve Berg to >/= 48/56.    Baseline  Revised/updated 02/02/2018 with patient scoring 46/56; 03/04/18 47/56    Time  8    Period  Weeks    Status  Partially Met      PT LONG TERM GOAL #3   Title  patient to improve gait speed to >/= 2.6 ft/sec    Baseline  Revised/updated 02/02/2018 due to STG performance.; 03/04/18 3.01 ft/sec    Time  8    Period  Weeks    Status  Achieved      PT LONG TERM  GOAL #4   Title  patient to demonstrate gait over various levels and surfaces with IND with LRAD with no LOB or overt instability for >/= 500 feet    Baseline  close guarding without LOB; <500 ft    Time  8    Period  Weeks    Status  Partially Met      PT LONG TERM GOAL #5   Title  patient to verbalize 3 fall prevention strategies    Baseline  could not independently name, however with questioning he could identify more than 3 improvements to home for reduce fall risk    Time  8    Period  Weeks    Status  Achieved      PT LONG TERM GOAL #6   Title  Patient will improve composite SOT score to 60     Baseline  01/27/18  56;  03/04/18  60    Status  Achieved       NEW GOALS PT Short Term Goals - 03/05/18 0931      PT SHORT TERM GOAL #1   Title  Patient reports doing his updated HEP at least 75% of the recommended frequency/reps. (Target all STGs 04/03/2018)    Time  4    Period  Weeks    Status  New    Target Date  04/03/18      PT SHORT TERM GOAL #2   Title  Patient to improve Berg to >=49/56    Baseline  03/04/18  47/56    Time  4    Period  Weeks    Status  New      PT SHORT TERM GOAL #3   Title  Patient to ambulate over unlevel grass x 150 ft while carrying golf club (potentially using for balance/support) modified independent    Time  4  Period  Weeks    Status  New      PT SHORT TERM GOAL #4   Title  Patient able to stand in grass and perform 8 out of 10 "chip shot" swings without imbalance.     Time  4    Period  Weeks    Status  New      PT SHORT TERM GOAL #5   Title  Patient will have negative positional testing (for BPPV)    Time  4    Period  Weeks    Status  New      PT Long Term Goals - 03/05/18 0940      PT LONG TERM GOAL #1   Title  patient to be independent with advanced HEP. (Target all LTGs 05/03/2018)    Time  8    Period  Weeks    Status  New    Target Date  05/03/18      PT LONG TERM GOAL #2   Title  Patient will improve composite score  on SOT to WNL.    Time  8    Period  Weeks    Status  New      PT LONG TERM GOAL #3   Title  Patient will ambulate 300 ft over grass terrain modified independent while carrying (and potentially using for balance) a golf club.    Time  8    Period  Weeks    Status  New      PT LONG TERM GOAL #4   Title  Patient will be able to bend down to set golf ball on tee, return to stand, and turn to set position for swing 5 of 5 trials with supervision.    Time  8    Period  Weeks    Status  New      PT LONG TERM GOAL #5   Title  Patient will be able to adapt near-full golf swing (with driver) without LOB 5 of 5 trials.     Time  8    Period  Weeks    Status  New           Plan - 03/04/18 1700    Clinical Impression Statement  LTGs assessed with pt meeting 3 of 6 goals, 2 goals were partially met (improved but not yet to goal level) and one goal was ongoing (independence with updated HEP). Based on pt's progress and potential for continued progress with possible return to safe activities on unlevel ground, will request recertification for additional 8 weeks of PT.     Rehab Potential  Good    PT Frequency  2x / week    PT Duration  8 weeks    PT Treatment/Interventions  ADLs/Self Care Home Management;DME Instruction;Balance training;Therapeutic exercise;Therapeutic activities;Functional mobility training;Stair training;Gait training;Neuromuscular re-education;Patient/family education;Vestibular;Taping;Manual techniques;Passive range of motion;Visual/perceptual remediation/compensation    PT Next Visit Plan  Compliant surface standing, turning in place, stepping strategies, weight shifting, golf swing for balance and considering ability to perform this for recreation. Walking outdoors to simulate walking on golf course    Consulted and Agree with Plan of Care  Patient       Patient will benefit from skilled therapeutic intervention in order to improve the following deficits and  impairments:  Abnormal gait, Decreased activity tolerance, Decreased balance, Difficulty walking, Decreased mobility, Postural dysfunction, Decreased endurance, Decreased knowledge of use of DME, Decreased strength, Dizziness, Impaired flexibility  Visit Diagnosis: Unsteadiness on feet  Other abnormalities of gait and mobility  Abnormal posture     Problem List Patient Active Problem List   Diagnosis Date Noted  . Diabetes mellitus 09/04/2010    Rexanne Mano, PT 03/05/2018, 9:25 AM  [facility] [address] [place], [name], [address] Phone: [phone]   Fax:  [phone]  Name: Alan Beltran MRN: [id number] Date of Birth: 09/14/1934

## 2018-03-08 ENCOUNTER — Encounter: Payer: Self-pay | Admitting: Physical Therapy

## 2018-03-08 ENCOUNTER — Ambulatory Visit: Payer: Medicare Other | Admitting: Physical Therapy

## 2018-03-08 DIAGNOSIS — R2681 Unsteadiness on feet: Secondary | ICD-10-CM | POA: Diagnosis not present

## 2018-03-08 DIAGNOSIS — R42 Dizziness and giddiness: Secondary | ICD-10-CM | POA: Diagnosis not present

## 2018-03-08 DIAGNOSIS — R2689 Other abnormalities of gait and mobility: Secondary | ICD-10-CM | POA: Diagnosis not present

## 2018-03-08 DIAGNOSIS — R293 Abnormal posture: Secondary | ICD-10-CM | POA: Diagnosis not present

## 2018-03-08 DIAGNOSIS — H8113 Benign paroxysmal vertigo, bilateral: Secondary | ICD-10-CM | POA: Diagnosis not present

## 2018-03-08 NOTE — Therapy (Signed)
[facility] [address] [place], [name], [address] Phone: [phone]   Fax:  [phone]  Physical Therapy Treatment  Patient Details  Name: Alan Beltran MRN: [id number] Date of Birth: [birth year] Referring Provider (PT): Dr. Vicie Mutters, MD   Encounter Date: 03/08/2018  PT End of Session - 03/08/18 1531    Visit Number  14    Number of Visits  35    Date for PT Re-Evaluation  05/03/18    Authorization Type  Medicare + [employer]    Authorization Time Period  03/04/18 to 05/03/18    PT Start Time  1532    PT Stop Time  1620    PT Time Calculation (min)  48 min    Activity Tolerance  Patient tolerated treatment well    Behavior During Therapy  [facility] for tasks assessed/performed       Past Medical History:  Diagnosis Date  . Asthma   . Diabetes mellitus     Past Surgical History:  Procedure Laterality Date  . EXTERNAL EAR SURGERY    . FOOT SURGERY      There were no vitals filed for this visit.  Subjective Assessment - 03/08/18 1531    Subjective  left calf began cramping yesterday morning and calf is still sore. Feels like something is crumpled up under the sole of his foot.     Pertinent History  DM, hearing loss, neuropathy    Patient Stated Goals  "to get my balance to where I feel stronger - I want to get back to playing golf"             Vestibular Assessment - 03/08/18 1553      Sidelying Right   Sidelying Right Duration  0    Sidelying Right Symptoms  No nystagmus   no symptoms until return to sit--no nystagmus (a "wave" of dizziness, but denies a sense of movement)     Sidelying Left   Sidelying Left Duration  none    Sidelying Left Symptoms  No nystagmus      Horizontal Canal Right   Horizontal Canal Right Duration  0    Horizontal Canal Right Symptoms  Normal      Horizontal Canal Left   Horizontal Canal Left Duration  0    Horizontal Canal Left Symptoms  Normal                [facility] Adult PT Treatment/Exercise - 03/08/18 1555      Knee/Hip Exercises: Stretches   Gastroc Stretch  Left;2 reps;30 seconds (seated with strap) Left and right 1 rep 30 sec, staggered stance at wall     Neuro re-ed-Session focused on turns 90 & 180 degrees--pt with one significant LOB and bumped into the wall; increased challenge with subtracting while turning. Also walking over unlevel surfaces (foam mats with weights underneath), and swinging golf club while on unlevel foam mats.  Walking up down ramp with blue mat forwards and sideways simulating unlevel surfaces encountered on golf courses.   On solid surface, green resistance band around head of golf club resisting patient's backswing x 10 reps with pt maintaining balance. Then placed pt on blue beam (crosswise) and pt practiced "chip shot" (no resistance to club head) and maintained balance x 10 swings         PT Short Term Goals - 03/05/18 0931      PT SHORT TERM GOAL #1   Title  Patient reports  doing his updated HEP at least 75% of the recommended frequency/reps. (Target all STGs 04/03/2018)    Time  4    Period  Weeks    Status  New    Target Date  04/03/18      PT SHORT TERM GOAL #2   Title  Patient to improve Berg to >=49/56    Baseline  03/04/18  47/56    Time  4    Period  Weeks    Status  New      PT SHORT TERM GOAL #3   Title  Patient to ambulate over unlevel grass x 150 ft while carrying golf club (potentially using for balance/support) modified independent    Time  4    Period  Weeks    Status  New      PT SHORT TERM GOAL #4   Title  Patient able to stand in grass and perform 8 out of 10 "chip shot" swings without imbalance.     Time  4    Period  Weeks    Status  New      PT SHORT TERM GOAL #5   Title  Patient will have negative positional testing (for BPPV)    Time  4    Period  Weeks    Status  New        PT Long Term Goals - 03/05/18 0940      PT LONG TERM GOAL #1    Title  patient to be independent with advanced HEP. (Target all LTGs 05/03/2018)    Time  8    Period  Weeks    Status  New    Target Date  05/03/18      PT LONG TERM GOAL #2   Title  Patient will improve composite score on SOT to WNL.    Time  8    Period  Weeks    Status  New      PT LONG TERM GOAL #3   Title  Patient will ambulate 300 ft over grass terrain modified independent while carrying (and potentially using for balance) a golf club.    Time  8    Period  Weeks    Status  New      PT LONG TERM GOAL #4   Title  Patient will be able to bend down to set golf ball on tee, return to stand, and turn to set position for swing 5 of 5 trials with supervision.    Time  8    Period  Weeks    Status  New      PT LONG TERM GOAL #5   Title  Patient will be able to adapt near-full golf swing (with driver) without LOB 5 of 5 trials.     Time  8    Period  Weeks    Status  New            Plan - 03/08/18 1635    Clinical Impression Statement  Session initiated with assessment for potential BPPV (see previous note) re: rotary nystagmus. Today could not elicit nystagmus with sidelying test bil or supine head roll bil. Pt reports when he moves from sidelying to sit he feels a "wave" pass over him--seconds only. But does not occur every day. Sometimes loses balance when he makes a quick turn--especially if he's headed one way and changes his mind and tries to quickly turn. But denies a sense of dizziness, just LOB. Patient  did very well on unlevel surfaces to simulate environment on the golf course (inclimate weather prevented working outdoors). Patient continues to make good progress.   Rehab Potential  Good    PT Frequency  2x / week    PT Duration  8 weeks    PT Treatment/Interventions  ADLs/Self Care Home Management;DME Instruction;Balance training;Therapeutic exercise;Therapeutic activities;Functional mobility training;Stair training;Gait training;Neuromuscular  re-education;Patient/family education;Vestibular;Taping;Manual techniques;Passive range of motion;Visual/perceptual remediation/compensation;Canalith Repostioning    PT Next Visit Plan  Compliant surface standing, turning in place, stepping strategies, weight shifting, golf swing for balance and considering ability to perform this for recreation. Walking outdoors to simulate walking on golf course    Consulted and Agree with Plan of Care  Patient       Patient will benefit from skilled therapeutic intervention in order to improve the following deficits and impairments:  Abnormal gait, Decreased activity tolerance, Decreased balance, Difficulty walking, Decreased mobility, Postural dysfunction, Decreased endurance, Decreased knowledge of use of DME, Decreased strength, Dizziness, Impaired flexibility  Visit Diagnosis: Unsteadiness on feet  Abnormal posture  Dizziness and giddiness     Problem List Patient Active Problem List   Diagnosis Date Noted  . Diabetes mellitus 09/04/2010    Rexanne Mano, PT 03/08/2018, 5:13 PM  [facility] [address] [place], [name], [address] Phone: [phone]   Fax:  [phone]  Name: Ameir Faria MRN: [id number] Date of Birth: 12-27-1934

## 2018-03-09 DIAGNOSIS — G4733 Obstructive sleep apnea (adult) (pediatric): Secondary | ICD-10-CM | POA: Diagnosis not present

## 2018-03-09 DIAGNOSIS — J45909 Unspecified asthma, uncomplicated: Secondary | ICD-10-CM | POA: Diagnosis not present

## 2018-03-10 ENCOUNTER — Encounter: Payer: Self-pay | Admitting: Physical Therapy

## 2018-03-10 ENCOUNTER — Ambulatory Visit: Payer: Medicare Other | Admitting: Physical Therapy

## 2018-03-10 DIAGNOSIS — R42 Dizziness and giddiness: Secondary | ICD-10-CM | POA: Diagnosis not present

## 2018-03-10 DIAGNOSIS — R2681 Unsteadiness on feet: Secondary | ICD-10-CM | POA: Diagnosis not present

## 2018-03-10 DIAGNOSIS — R293 Abnormal posture: Secondary | ICD-10-CM | POA: Diagnosis not present

## 2018-03-10 DIAGNOSIS — R2689 Other abnormalities of gait and mobility: Secondary | ICD-10-CM | POA: Diagnosis not present

## 2018-03-10 DIAGNOSIS — H8113 Benign paroxysmal vertigo, bilateral: Secondary | ICD-10-CM | POA: Diagnosis not present

## 2018-03-10 NOTE — Therapy (Signed)
[facility] [address] [place], [name], [address] Phone: [phone]   Fax:  [phone]  Physical Therapy Treatment  Patient Details  Name: Alan Beltran MRN: [id number] Date of Birth: 1934/09/11 Referring Provider (PT): Dr. Vicie Mutters, MD   Encounter Date: 03/10/2018  PT End of Session - 03/10/18 2145    Visit Number  15    Number of Visits  35    Date for PT Re-Evaluation  05/03/18    Authorization Type  Medicare + [employer]    Authorization Time Period  03/04/18 to 05/03/18    PT Start Time  1537    PT Stop Time  1616    PT Time Calculation (min)  39 min    Activity Tolerance  Patient tolerated treatment well    Behavior During Therapy  [facility] for tasks assessed/performed       Past Medical History:  Diagnosis Date  . Asthma   . Diabetes mellitus     Past Surgical History:  Procedure Laterality Date  . EXTERNAL EAR SURGERY    . FOOT SURGERY      There were no vitals filed for this visit.  Subjective Assessment - 03/10/18 1540    Subjective  No changes. No near falls. calf and plantar fascia are feeling better (almost gone)    Pertinent History  DM, hearing loss, neuropathy    Patient Stated Goals  "to get my balance to where I feel stronger - I want to get back to playing golf"    Currently in Pain?  No/denies                       [facility] Adult PT Treatment/Exercise - 03/10/18 1558      Knee/Hip Exercises: Stretches   Passive Hamstring Stretch  Both;2 reps;30 seconds    Passive Hamstring Stretch Limitations  seated with leg propped up on stool; alternating legs      Knee/Hip Exercises: Aerobic   Tread Mill  1.6 mph x 2 min          Balance Exercises - 03/10/18 1558      Balance Exercises: Standing   Wall Bumps  Hip    Wall Bumps-Hips  Eyes opened;Eyes closed;Anterior/posterior;15 reps    Rockerboard  Anterior/posterior;EO;Head turns;Intermittent UE support   squats;  pressing down thru toes/ant shift; down thru heels   Other Standing Exercises  each foot on piece of 1" foam, whiffle golf balls placed on non slip surface; pt putted >10 blalls          PT Short Term Goals - 03/08/18 2118      PT SHORT TERM GOAL #1   Title  Patient reports doing his updated HEP at least 75% of the recommended frequency/reps. (Target all STGs 04/03/2018)    Time  4    Period  Weeks    Status  New    Target Date  04/03/18      PT SHORT TERM GOAL #2   Title  Patient to improve Berg to >=49/56    Baseline  03/04/18  47/56    Time  4    Period  Weeks    Status  New      PT SHORT TERM GOAL #3   Title  Patient to ambulate over unlevel grass x 150 ft while carrying golf club (potentially using for balance/support) modified independent    Time  4    Period  Weeks    Status  New      PT SHORT TERM GOAL #4   Title  Patient able to stand in grass and perform 8 out of 10 "chip shot" swings without imbalance.     Time  4    Period  Weeks    Status  New      PT SHORT TERM GOAL #5   Title  Patient will have negative positional testing (for BPPV)    Baseline  03/08/18 negative sidelying and supine head roll    Time  4    Period  Weeks    Status  On-going        PT Long Term Goals - 03/05/18 0940      PT LONG TERM GOAL #1   Title  patient to be independent with advanced HEP. (Target all LTGs 05/03/2018)    Time  8    Period  Weeks    Status  New    Target Date  05/03/18      PT LONG TERM GOAL #2   Title  Patient will improve composite score on SOT to WNL.    Time  8    Period  Weeks    Status  New      PT LONG TERM GOAL #3   Title  Patient will ambulate 300 ft over grass terrain modified independent while carrying (and potentially using for balance) a golf club.    Time  8    Period  Weeks    Status  New      PT LONG TERM GOAL #4   Title  Patient will be able to bend down to set golf ball on tee, return to stand, and turn to set position for swing 5 of 5  trials with supervision.    Time  8    Period  Weeks    Status  New      PT LONG TERM GOAL #5   Title  Patient will be able to adapt near-full golf swing (with driver) without LOB 5 of 5 trials.     Time  8    Period  Weeks    Status  New            Plan - 03/10/18 2146    Clinical Impression Statement  Session focused on balance training (ankle and hip strategies for balance recovery), including stimulating his vestibular system. Finished with balance activity on foam (putting golf balls) with pt reaching to floor >10 times to set or pick up golf ball with min assist only 1x due to posterior lean. Patient very motvated.     Rehab Potential  Good    PT Frequency  2x / week    PT Duration  8 weeks    PT Treatment/Interventions  ADLs/Self Care Home Management;DME Instruction;Balance training;Therapeutic exercise;Therapeutic activities;Functional mobility training;Stair training;Gait training;Neuromuscular re-education;Patient/family education;Vestibular;Taping;Manual techniques;Passive range of motion;Visual/perceptual remediation/compensation;Canalith Repostioning    PT Next Visit Plan Ask about change/cancel 3/24 appt?  Compliant surface standing and swinging golf club, turning in place, stepping strategies, weight shifting, golf swing for balance and considering ability to perform this for recreation. Walking outdoors to simulate walking on golf course    Consulted and Agree with Plan of Care  Patient       Patient will benefit from skilled therapeutic intervention in order to improve the following deficits and impairments:  Abnormal gait, Decreased activity tolerance, Decreased balance, Difficulty walking, Decreased mobility, Postural dysfunction, Decreased endurance,  Decreased knowledge of use of DME, Decreased strength, Dizziness, Impaired flexibility  Visit Diagnosis: Unsteadiness on feet     Problem List Patient Active Problem List   Diagnosis Date Noted  . Diabetes  mellitus 09/04/2010    Rexanne Mano, PT 03/10/2018, 9:56 PM  [facility] [address] [place], [name], [address] Phone: [phone]   Fax:  [phone]  Name: Alan Beltran MRN: [id number] Date of Birth: 06-05-1934

## 2018-03-15 ENCOUNTER — Ambulatory Visit: Payer: Medicare Other | Admitting: Physical Therapy

## 2018-03-15 ENCOUNTER — Encounter: Payer: Self-pay | Admitting: Physical Therapy

## 2018-03-15 DIAGNOSIS — R2689 Other abnormalities of gait and mobility: Secondary | ICD-10-CM | POA: Diagnosis not present

## 2018-03-15 DIAGNOSIS — R293 Abnormal posture: Secondary | ICD-10-CM | POA: Diagnosis not present

## 2018-03-15 DIAGNOSIS — H8113 Benign paroxysmal vertigo, bilateral: Secondary | ICD-10-CM | POA: Diagnosis not present

## 2018-03-15 DIAGNOSIS — E291 Testicular hypofunction: Secondary | ICD-10-CM | POA: Diagnosis not present

## 2018-03-15 DIAGNOSIS — R2681 Unsteadiness on feet: Secondary | ICD-10-CM

## 2018-03-15 DIAGNOSIS — E538 Deficiency of other specified B group vitamins: Secondary | ICD-10-CM | POA: Diagnosis not present

## 2018-03-15 DIAGNOSIS — R42 Dizziness and giddiness: Secondary | ICD-10-CM | POA: Diagnosis not present

## 2018-03-15 NOTE — Therapy (Signed)
[facility] [address] [place], [name], [address] Phone: [phone]   Fax:  [phone]  Physical Therapy Treatment  Patient Details  Name: Alan Beltran MRN: [id number] Date of Birth: 08/30/1934 Referring Provider (PT): Dr. Vicie Mutters, MD   Encounter Date: 03/15/2018  PT End of Session - 03/15/18 1542    Visit Number  16    Number of Visits  35    Date for PT Re-Evaluation  05/03/18    Authorization Type  Medicare + [employer]    Authorization Time Period  03/04/18 to 05/03/18    PT Start Time  1537    PT Stop Time  1625    PT Time Calculation (min)  48 min    Equipment Utilized During Treatment  Gait belt    Activity Tolerance  Patient limited by fatigue   required seated rest after walking in grass   Behavior During Therapy  [facility] for tasks assessed/performed       Past Medical History:  Diagnosis Date  . Asthma   . Diabetes mellitus     Past Surgical History:  Procedure Laterality Date  . EXTERNAL EAR SURGERY    . FOOT SURGERY      There were no vitals filed for this visit.  Subjective Assessment - 03/15/18 1543    Subjective  Caught his rt 3rd finger in his shoe and now rt 2nd-3rd MP joints swollen and sore.     Pertinent History  DM, hearing loss, neuropathy    Patient Stated Goals  "to get my balance to where I feel stronger - I want to get back to playing golf"    Currently in Pain?  Yes    Pain Score  5     Pain Location  Hand    Pain Orientation  Right    Pain Descriptors / Indicators  Grimacing;Tender    Pain Type  Acute pain    Pain Onset  In the past 7 days    Pain Frequency  Constant    Aggravating Factors   using rt hand    Pain Relieving Factors  none                       [facility] Adult PT Treatment/Exercise - 03/15/18 1700      Ambulation/Gait   Ambulation/Gait  Yes    Ambulation/Gait Assistance  4: Min guard    Ambulation/Gait Assistance Details  over unlevel  grass, including walking in grass sloping down to his left; no LOB or assist to maintain balance; pt noted legs fatigue more quickly walking in grass    Ambulation Distance (Feet)  500 Feet   x 2   Assistive device  None    Gait Pattern  Step-through pattern;Decreased stride length;Trunk flexed;Decreased arm swing - right;Decreased arm swing - left;Wide base of support    Ambulation Surface  Unlevel;Outdoor;Grass;Paved          Balance Exercises - 03/15/18 2105      Balance Exercises: Standing   Stepping Strategy  Anterior;Lateral;Foam/compliant surface   on red mat; stepping to targets vs tap as PT called out   Step Ups  Forward;2 inch   step up onto red mat, step off backward, side step repeat      Self care-Educated re: golf lessons available through [employer] for former [employer] requiring adaptations to improve safety  PT Education - 03/15/18 2107    Education Details  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 (Target all STGs 04/03/2018)    Time  4    Period  Weeks    Status  New    Target Date  04/03/18      PT SHORT TERM GOAL #2   Title  Patient to improve Berg to >=49/56    Baseline  03/04/18  47/56    Time  4    Period  Weeks    Status  New      PT SHORT TERM GOAL #3   Title  Patient to ambulate over unlevel grass x 150 ft while carrying golf club (potentially using for balance/support) modified independent    Time  4    Period  Weeks    Status  New      PT SHORT TERM GOAL #4   Title  Patient able to stand in grass and perform 8 out of 10 "chip shot" swings  without imbalance.     Time  4    Period  Weeks    Status  New      PT SHORT TERM GOAL #5   Title  Patient will have negative positional testing (for BPPV)    Baseline  03/08/18 negative sidelying and supine head roll    Time  4    Period  Weeks    Status  On-going        PT Long Term Goals - 03/05/18 0940      PT LONG TERM GOAL #1   Title  patient to be independent with advanced HEP. (Target all LTGs 05/03/2018)    Time  8    Period  Weeks    Status  New    Target Date  05/03/18      PT LONG TERM GOAL #2   Title  Patient will improve composite score on SOT to WNL.    Time  8    Period  Weeks    Status  New      PT LONG TERM GOAL #3   Title  Patient will ambulate 300 ft over grass terrain modified independent while carrying (and potentially using for balance) a golf club.    Time  8    Period  Weeks    Status  New      PT LONG TERM GOAL #4   Title  Patient will be able to bend down to set golf ball on tee, return to stand, and turn to set position for swing 5 of 5 trials with supervision.    Time  8    Period  Weeks    Status  New      PT LONG TERM GOAL #5   Title  Patient will be able to adapt near-full golf swing (with driver) without LOB 5 of 5 trials.     Time  8    Period  Weeks    Status  New            Plan -  03/15/18 2110    Clinical Impression Statement  Weather permitted working outdoors in the grass, however pt's injury to his right fingers prevented using golf clubs for practice swings today (for balance challenge and assess safety with task). Patient was able to walk over very unlevel grass, including walking 50 ft along a stretch sloping down to his left (i.e. rt foot on higher ground than left), without imbalance. He did fatigue much more quickly than when walking on solid surface. Discussed possible golf lessons for former members of the [employer] for free through [employer] with pt interested. Session also focused on balance  activities and stepping strategies on compliant surface. Overall, pt continues to make progress towards goals.     Rehab Potential  Good    PT Frequency  2x / week    PT Duration  8 weeks    PT Treatment/Interventions  ADLs/Self Care Home Management;DME Instruction;Balance training;Therapeutic exercise;Therapeutic activities;Functional mobility training;Stair training;Gait training;Neuromuscular re-education;Patient/family education;Vestibular;Taping;Manual techniques;Passive range of motion;Visual/perceptual remediation/compensation;Canalith Repostioning    PT Next Visit Plan  Compliant surface walking, stepping strategies and swinging golf club;  turning in place, weight shifting, golf swing for balance and considering ability to perform this for recreation. Walking outdoors to simulate walking on golf course    Consulted and Agree with Plan of Care  Patient       Patient will benefit from skilled therapeutic intervention in order to improve the following deficits and impairments:  Abnormal gait, Decreased activity tolerance, Decreased balance, Difficulty walking, Decreased mobility, Postural dysfunction, Decreased endurance, Decreased knowledge of use of DME, Decreased strength, Dizziness, Impaired flexibility  Visit Diagnosis: Unsteadiness on feet  Other abnormalities of gait and mobility     Problem List Patient Active Problem List   Diagnosis Date Noted  . Diabetes mellitus 09/04/2010    Rexanne Mano, PT 03/15/2018, 9:20 PM  [facility] [address] [place], [name], [address] Phone: [phone]   Fax:  [phone]  Name: Gabriel Conry MRN: [id number] Date of Birth: [birth year]

## 2018-03-18 ENCOUNTER — Encounter: Payer: Self-pay | Admitting: Physical Therapy

## 2018-03-18 ENCOUNTER — Ambulatory Visit: Payer: Medicare Other | Admitting: Physical Therapy

## 2018-03-18 DIAGNOSIS — H8113 Benign paroxysmal vertigo, bilateral: Secondary | ICD-10-CM | POA: Diagnosis not present

## 2018-03-18 DIAGNOSIS — R42 Dizziness and giddiness: Secondary | ICD-10-CM | POA: Diagnosis not present

## 2018-03-18 DIAGNOSIS — R2689 Other abnormalities of gait and mobility: Secondary | ICD-10-CM | POA: Diagnosis not present

## 2018-03-18 DIAGNOSIS — R293 Abnormal posture: Secondary | ICD-10-CM

## 2018-03-18 DIAGNOSIS — R2681 Unsteadiness on feet: Secondary | ICD-10-CM | POA: Diagnosis not present

## 2018-03-18 NOTE — Therapy (Signed)
[facility] [address] [place], [name], [address] Phone: [phone]   Fax:  [phone]  Physical Therapy Treatment  Patient Details  Name: Alan Beltran MRN: [id number] Date of Birth: 10/11/1934 Referring Provider (PT): Dr. Vicie Mutters, MD   Encounter Date: 03/18/2018  PT End of Session - 03/18/18 1536    Visit Number  17    Number of Visits  35    Date for PT Re-Evaluation  05/03/18    Authorization Type  Medicare + [employer]    Authorization Time Period  03/04/18 to 05/03/18    PT Start Time  1533    PT Stop Time  1618    PT Time Calculation (min)  45 min    Equipment Utilized During Treatment  Gait belt    Activity Tolerance  Patient limited by fatigue   required seated rest breaks   Behavior During Therapy  W[facility] for tasks assessed/performed       Past Medical History:  Diagnosis Date  . Asthma   . Diabetes mellitus     Past Surgical History:  Procedure Laterality Date  . EXTERNAL EAR SURGERY    . FOOT SURGERY      There were no vitals filed for this visit.  Subjective Assessment - 03/18/18 1537    Subjective  Finger is still sore, but better. Hasn't been doing his exercises and knows they won't help him if he doesn't do them.     Pertinent History  DM, hearing loss, neuropathy    Patient Stated Goals  "to get my balance to where I feel stronger - I want to get back to playing golf"    Currently in Pain?  No/denies    Pain Onset  --                       [facility] Adult PT Treatment/Exercise - 03/18/18 1559      Ambulation/Gait   Ambulation/Gait Assistance  4: Min guard    Ambulation/Gait Assistance Details  over red + blue mats with bean bags underneath to simulate unlevel ground    Ambulation Distance (Feet)  75 Feet   75 forward; 25 laterally (rt & lt each)   Assistive device  None    Ambulation Surface  Unlevel;Indoor    Gait Comments  upfacing on ramp bending and picking up  bean bags;       Posture/Postural Control   Posture/Postural Control  Postural limitations    Postural Limitations  Rounded Shoulders;Forward head;Posterior pelvic tilt    Posture Comments  standing in doorframe, chin tuck and scapular squeeze with 10 second hold; back up to countertop, place hands on counter on either side and stretch into extension/look up          Balance Exercises - 03/18/18 1553      Balance Exercises: Standing   Stepping Strategy  Anterior;Posterior;Lateral;Foam/compliant surface;10 reps   each leg   Sidestepping  Foam/compliant support;5 reps   red + blue mat with bean bags underneatyh   Other Standing Exercises  picking up bean bags and tossing 8-10 ft into basket       Standing with rt foot on blue airex, left foot on 1" foam putting golf balls (whiffle) at target 12 ft away x 6 reps and needed a rest    PT Short Term Goals - 03/08/18 2118      PT SHORT TERM GOAL #1   Title  Patient reports doing his updated HEP at least 75% of the recommended frequency/reps. (Target all STGs 04/03/2018)    Time  4    Period  Weeks    Status  New    Target Date  04/03/18      PT SHORT TERM GOAL #2   Title  Patient to improve Berg to >=49/56    Baseline  03/04/18  47/56    Time  4    Period  Weeks    Status  New      PT SHORT TERM GOAL #3   Title  Patient to ambulate over unlevel grass x 150 ft while carrying golf club (potentially using for balance/support) modified independent    Time  4    Period  Weeks    Status  New      PT SHORT TERM GOAL #4   Title  Patient able to stand in grass and perform 8 out of 10 "chip shot" swings without imbalance.     Time  4    Period  Weeks    Status  New      PT SHORT TERM GOAL #5   Title  Patient will have negative positional testing (for BPPV)    Baseline  03/08/18 negative sidelying and supine head roll    Time  4    Period  Weeks    Status  On-going        PT Long Term Goals - 03/05/18 0940      PT LONG TERM  GOAL #1   Title  patient to be independent with advanced HEP. (Target all LTGs 05/03/2018)    Time  8    Period  Weeks    Status  New    Target Date  05/03/18      PT LONG TERM GOAL #2   Title  Patient will improve composite score on SOT to WNL.    Time  8    Period  Weeks    Status  New      PT LONG TERM GOAL #3   Title  Patient will ambulate 300 ft over grass terrain modified independent while carrying (and potentially using for balance) a golf club.    Time  8    Period  Weeks    Status  New      PT LONG TERM GOAL #4   Title  Patient will be able to bend down to set golf ball on tee, return to stand, and turn to set position for swing 5 of 5 trials with supervision.    Time  8    Period  Weeks    Status  New      PT LONG TERM GOAL #5   Title  Patient will be able to adapt near-full golf swing (with driver) without LOB 5 of 5 trials.     Time  8    Period  Weeks    Status  New            Plan - 03/18/18 1630    Clinical Impression Statement  Session focused on dynamic balance activities over unlevel surfaces as pt's goal is to get back on the golf course. Patient's legs definitely fatigued more quickly on unlevel surfaces and requested seated rest x 4 over 45 minutes. End of session focused on upright posture with stretches and strengthening.     Rehab Potential  Good    PT Frequency  2x / week  PT Duration  8 weeks    PT Treatment/Interventions  ADLs/Self Care Home Management;DME Instruction;Balance training;Therapeutic exercise;Therapeutic activities;Functional mobility training;Stair training;Gait training;Neuromuscular re-education;Patient/family education;Vestibular;Taping;Manual techniques;Passive range of motion;Visual/perceptual remediation/compensation;Canalith Repostioning    PT Next Visit Plan  Compliant surface walking, stepping strategies and swinging golf club;  turning in place, weight shifting, His goal is to get back on the golf course; when he sees  Alan Beltran he wants him to review floor transfers with him again    Consulted and Agree with Plan of Care  Patient       Patient will benefit from skilled therapeutic intervention in order to improve the following deficits and impairments:  Abnormal gait, Decreased activity tolerance, Decreased balance, Difficulty walking, Decreased mobility, Postural dysfunction, Decreased endurance, Decreased knowledge of use of DME, Decreased strength, Dizziness, Impaired flexibility  Visit Diagnosis: Unsteadiness on feet  Abnormal posture     Problem List Patient Active Problem List   Diagnosis Date Noted  . Diabetes mellitus 09/04/2010    Rexanne Mano, PT 03/18/2018, 4:34 PM  [facility] [address] [place], [name], [address] Phone: [phone]   Fax:  [phone]  Name: Alan Beltran MRN: [id number] Date of Birth: 10-Dec-1934

## 2018-03-22 ENCOUNTER — Ambulatory Visit: Payer: Medicare Other | Admitting: Physical Therapy

## 2018-03-22 DIAGNOSIS — R293 Abnormal posture: Secondary | ICD-10-CM | POA: Diagnosis not present

## 2018-03-22 DIAGNOSIS — R2681 Unsteadiness on feet: Secondary | ICD-10-CM

## 2018-03-22 DIAGNOSIS — R42 Dizziness and giddiness: Secondary | ICD-10-CM | POA: Diagnosis not present

## 2018-03-22 DIAGNOSIS — H8113 Benign paroxysmal vertigo, bilateral: Secondary | ICD-10-CM | POA: Diagnosis not present

## 2018-03-22 DIAGNOSIS — R2689 Other abnormalities of gait and mobility: Secondary | ICD-10-CM | POA: Diagnosis not present

## 2018-03-22 NOTE — Therapy (Signed)
[facility] [address] [place], [name], [address] Phone: [phone]   Fax:  [phone]  Physical Therapy Treatment  Patient Details  Name: Alan Beltran MRN: [id number] Date of Birth: 1934-08-08 Referring Provider (PT): Dr. Vicie Mutters, MD   Encounter Date: 03/22/2018  PT End of Session - 03/22/18 2315    Visit Number  18    Number of Visits  35    Date for PT Re-Evaluation  05/03/18    Authorization Type  Medicare + [employer]    Authorization Time Period  03/04/18 to 05/03/18    PT Start Time  1530    PT Stop Time  1615    PT Time Calculation (min)  45 min    Activity Tolerance  Patient tolerated treatment well    Behavior During Therapy  [facility] for tasks assessed/performed       Past Medical History:  Diagnosis Date  . Asthma   . Diabetes mellitus     Past Surgical History:  Procedure Laterality Date  . EXTERNAL EAR SURGERY    . FOOT SURGERY      There were no vitals filed for this visit.  Subjective Assessment - 03/22/18 2315    Subjective  Pt relays no complaints today    Patient Stated Goals  "to get my balance to where I feel stronger - I want to get back to playing golf"    Currently in Pain?  No/denies      Therex and Neuro rehab performed today:  Walking 3 laps to warm up // bars  for Airex step up/downs fwd and lateral  x10 each, tandem walk up down X 3 Simulated golf swing outside hitting wiffle balls X 20 with supervision intermittently spaced out with  Walking on uneven grass 30 ft X 10 with one standing rest break intermit spaced out with Picking up wiffle balls off ground X20  with one standing rest break and intermit spaced out                           PT Short Term Goals - 03/08/18 2118      PT SHORT TERM GOAL #1   Title  Patient reports doing his updated HEP at least 75% of the recommended frequency/reps. (Target all STGs 04/03/2018)    Time  4    Period   Weeks    Status  New    Target Date  04/03/18      PT SHORT TERM GOAL #2   Title  Patient to improve Berg to >=49/56    Baseline  03/04/18  47/56    Time  4    Period  Weeks    Status  New      PT SHORT TERM GOAL #3   Title  Patient to ambulate over unlevel grass x 150 ft while carrying golf club (potentially using for balance/support) modified independent    Time  4    Period  Weeks    Status  New      PT SHORT TERM GOAL #4   Title  Patient able to stand in grass and perform 8 out of 10 "chip shot" swings without imbalance.     Time  4    Period  Weeks    Status  New      PT SHORT TERM GOAL #5   Title  Patient will have negative positional testing (for BPPV)  Baseline  03/08/18 negative sidelying and supine head roll    Time  4    Period  Weeks    Status  On-going        PT Long Term Goals - 03/05/18 0940      PT LONG TERM GOAL #1   Title  patient to be independent with advanced HEP. (Target all LTGs 05/03/2018)    Time  8    Period  Weeks    Status  New    Target Date  05/03/18      PT LONG TERM GOAL #2   Title  Patient will improve composite score on SOT to WNL.    Time  8    Period  Weeks    Status  New      PT LONG TERM GOAL #3   Title  Patient will ambulate 300 ft over grass terrain modified independent while carrying (and potentially using for balance) a golf club.    Time  8    Period  Weeks    Status  New      PT LONG TERM GOAL #4   Title  Patient will be able to bend down to set golf ball on tee, return to stand, and turn to set position for swing 5 of 5 trials with supervision.    Time  8    Period  Weeks    Status  New      PT LONG TERM GOAL #5   Title  Patient will be able to adapt near-full golf swing (with driver) without LOB 5 of 5 trials.     Time  8    Period  Weeks    Status  New            Plan - 03/22/18 2317    Clinical Impression Statement  Session focused on balance and gait on uneven terrain and endurance today with good  tolerance. He was able to show good balance for simulated full golf swings outside in grass with wiffle balls, then show good balance to walk through the grass and pick up the balls X 20 with overall supervision only and no LOB. He also requred less breaks today. PT will continue to progress as able toward his goals.     PT Frequency  2x / week    PT Duration  8 weeks    PT Treatment/Interventions  ADLs/Self Care Home Management;DME Instruction;Balance training;Therapeutic exercise;Therapeutic activities;Functional mobility training;Stair training;Gait training;Neuromuscular re-education;Patient/family education;Vestibular;Taping;Manual techniques;Passive range of motion;Visual/perceptual remediation/compensation;Canalith Repostioning    PT Next Visit Plan  Compliant surface walking, stepping strategies and swinging golf club;  turning in place, weight shifting, His goal is to get back on the golf course; when he sees [name] he wants him to review floor transfers with him again    Consulted and Agree with Plan of Care  Patient       Patient will benefit from skilled therapeutic intervention in order to improve the following deficits and impairments:  Abnormal gait, Decreased activity tolerance, Decreased balance, Difficulty walking, Decreased mobility, Postural dysfunction, Decreased endurance, Decreased knowledge of use of DME, Decreased strength, Dizziness, Impaired flexibility  Visit Diagnosis: Unsteadiness on feet  Abnormal posture     Problem List Patient Active Problem List   Diagnosis Date Noted  . Diabetes mellitus 09/04/2010    Silvestre Mesi 03/22/2018, 11:21 PM  [facility] [address] [place], [name], [address] Phone: [phone]   Fax:  [place]  Name: Alan Beltran MRN: [id number] Date of Birth: 1934-03-30

## 2018-03-24 ENCOUNTER — Ambulatory Visit: Payer: Medicare Other | Admitting: Physical Therapy

## 2018-03-24 DIAGNOSIS — R2689 Other abnormalities of gait and mobility: Secondary | ICD-10-CM | POA: Diagnosis not present

## 2018-03-24 DIAGNOSIS — R42 Dizziness and giddiness: Secondary | ICD-10-CM | POA: Diagnosis not present

## 2018-03-24 DIAGNOSIS — R293 Abnormal posture: Secondary | ICD-10-CM

## 2018-03-24 DIAGNOSIS — R2681 Unsteadiness on feet: Secondary | ICD-10-CM

## 2018-03-24 DIAGNOSIS — H8113 Benign paroxysmal vertigo, bilateral: Secondary | ICD-10-CM | POA: Diagnosis not present

## 2018-03-24 NOTE — Therapy (Signed)
[facility] [address] [place], [name], [address] Phone: [phone]   Fax:  [phone]  Physical Therapy Treatment  Patient Details  Name: Alan Beltran MRN: [id number] Date of Birth: 1934-06-27 Referring Provider (PT): Dr. Vicie Mutters, MD   Encounter Date: 03/24/2018  PT End of Session - 03/24/18 2102    Visit Number  19    Number of Visits  35    Date for PT Re-Evaluation  05/03/18    Authorization Type  Medicare + [employer]    Authorization Time Period  03/04/18 to 05/03/18    PT Start Time  0300   15 late start due to being in bathroom that long   PT Stop Time  0335    PT Time Calculation (min)  35 min    Activity Tolerance  Patient tolerated treatment well    Behavior During Therapy  [facility] for tasks assessed/performed       Past Medical History:  Diagnosis Date  . Asthma   . Diabetes mellitus     Past Surgical History:  Procedure Laterality Date  . EXTERNAL EAR SURGERY    . FOOT SURGERY      There were no vitals filed for this visit.  Subjective Assessment - 03/24/18 2100    Subjective  Pt relays his stomach is messed up and he has had to stay in the bathroom alot today, he arrived to his appointment on time but spent the first 15 minutes in the bathroom so session a little shorter today due to time constraints.     Patient is accompained by:  Family member    Pertinent History  DM, hearing loss, neuropathy    Patient Stated Goals  "to get my balance to where I feel stronger - I want to get back to playing golf"    Currently in Pain?  No/denies      Therex and Neuro rehab performed today:  Nu step to warm up L5 5 min LE/UE // bars for Airex balance with feet together 1 min then airex step up/downs fwd and lateral x10 each then X 3 each of  tandem walk up, retro walking and march walking (needed 2 rest breaks during this) Simulated golf swing outside hitting wiffle balls X 16 with supervision  intermittently spaced out with Walking on uneven grass 30 ft X 8 without rest break intermit spaced out with Picking up wiffle balls off ground X16  intermit spaced out   PT Education - 03/24/18 2102    Education Details  handout for adaptive golf clinics    Person(s) Educated  Patient    Methods  Explanation;Handout    Comprehension  Verbalized understanding       PT Short Term Goals - 03/08/18 2118      PT SHORT TERM GOAL #1   Title  Patient reports doing his updated HEP at least 75% of the recommended frequency/reps. (Target all STGs 04/03/2018)    Time  4    Period  Weeks    Status  New    Target Date  04/03/18      PT SHORT TERM GOAL #2   Title  Patient to improve Berg to >=49/56    Baseline  03/04/18  47/56    Time  4    Period  Weeks    Status  New      PT SHORT TERM GOAL #3   Title  Patient to ambulate over unlevel grass  x 150 ft while carrying golf club (potentially using for balance/support) modified independent    Time  4    Period  Weeks    Status  New      PT SHORT TERM GOAL #4   Title  Patient able to stand in grass and perform 8 out of 10 "chip shot" swings without imbalance.     Time  4    Period  Weeks    Status  New      PT SHORT TERM GOAL #5   Title  Patient will have negative positional testing (for BPPV)    Baseline  03/08/18 negative sidelying and supine head roll    Time  4    Period  Weeks    Status  On-going        PT Long Term Goals - 03/05/18 0940      PT LONG TERM GOAL #1   Title  patient to be independent with advanced HEP. (Target all LTGs 05/03/2018)    Time  8    Period  Weeks    Status  New    Target Date  05/03/18      PT LONG TERM GOAL #2   Title  Patient will improve composite score on SOT to WNL.    Time  8    Period  Weeks    Status  New      PT LONG TERM GOAL #3   Title  Patient will ambulate 300 ft over grass terrain modified independent while carrying (and potentially using for balance) a golf club.    Time  8     Period  Weeks    Status  New      PT LONG TERM GOAL #4   Title  Patient will be able to bend down to set golf ball on tee, return to stand, and turn to set position for swing 5 of 5 trials with supervision.    Time  8    Period  Weeks    Status  New      PT LONG TERM GOAL #5   Title  Patient will be able to adapt near-full golf swing (with driver) without LOB 5 of 5 trials.     Time  8    Period  Weeks    Status  New            Plan - 03/24/18 2103    Clinical Impression Statement  Session focused on balance, endurance, and gait on uneven terrain outside in grass. He had improvements today with balance during retrowalking and step up/down on foam pad however was slightly less steady outside today. PT will continue to progress these areas as able.     Rehab Potential  Good    PT Frequency  2x / week    PT Duration  8 weeks    PT Treatment/Interventions  ADLs/Self Care Home Management;DME Instruction;Balance training;Therapeutic exercise;Therapeutic activities;Functional mobility training;Stair training;Gait training;Neuromuscular re-education;Patient/family education;Vestibular;Taping;Manual techniques;Passive range of motion;Visual/perceptual remediation/compensation;Canalith Repostioning    PT Next Visit Plan  Compliant surface walking, stepping strategies and swinging golf club;  turning in place, weight shifting, His goal is to get back on the golf course; when he sees [name] he wants him to review floor transfers with him again    Consulted and Agree with Plan of Care  Patient       Patient will benefit from skilled therapeutic intervention in order to improve the following deficits and impairments:  Abnormal gait, Decreased activity tolerance, Decreased balance, Difficulty walking, Decreased mobility, Postural dysfunction, Decreased endurance, Decreased knowledge of use of DME, Decreased strength, Dizziness, Impaired flexibility  Visit Diagnosis: Unsteadiness on  feet  Abnormal posture  Other abnormalities of gait and mobility     Problem List Patient Active Problem List   Diagnosis Date Noted  . Diabetes mellitus 09/04/2010    Silvestre Mesi 03/24/2018, 9:06 PM  [facility] [address] [place], [name], [address] Phone: [phone]   Fax:  [phone]  Name: Alan Beltran MRN: [id number] Date of Birth: September 21, 1934

## 2018-03-29 ENCOUNTER — Ambulatory Visit: Payer: Medicare Other | Attending: Family Medicine | Admitting: Physical Therapy

## 2018-03-29 DIAGNOSIS — E538 Deficiency of other specified B group vitamins: Secondary | ICD-10-CM | POA: Diagnosis not present

## 2018-03-29 DIAGNOSIS — E291 Testicular hypofunction: Secondary | ICD-10-CM | POA: Diagnosis not present

## 2018-03-29 DIAGNOSIS — R2689 Other abnormalities of gait and mobility: Secondary | ICD-10-CM | POA: Diagnosis not present

## 2018-03-29 DIAGNOSIS — R2681 Unsteadiness on feet: Secondary | ICD-10-CM

## 2018-03-29 DIAGNOSIS — R42 Dizziness and giddiness: Secondary | ICD-10-CM | POA: Insufficient documentation

## 2018-03-29 DIAGNOSIS — R293 Abnormal posture: Secondary | ICD-10-CM | POA: Insufficient documentation

## 2018-03-29 NOTE — Therapy (Addendum)
[facility] [address] [place], [name], [address] Phone: [phone]   Fax:  [phone]  Physical Therapy Treatment Progress note Addendum Progress Note reporting period 12/29/17 to 03/29/18.  See below for objective and subjective measurements relating to patients progress with PT.   Patient Details  Name: Alan Beltran MRN: [id number] Date of Birth: 22-Apr-1934 Referring Provider (PT): Dr. Vicie Mutters, MD   Encounter Date: 03/29/2018  PT End of Session - 03/29/18 2055    Visit Number  20    Number of Visits  35    Date for PT Re-Evaluation  05/03/18    Authorization Type  Medicare + [employer]    Authorization Time Period  03/04/18 to 05/03/18    PT Start Time  1530    PT Stop Time  1615    PT Time Calculation (min)  45 min    Activity Tolerance  Patient tolerated treatment well    Behavior During Therapy  [facility] for tasks assessed/performed       Past Medical History:  Diagnosis Date  . Asthma   . Diabetes mellitus     Past Surgical History:  Procedure Laterality Date  . EXTERNAL EAR SURGERY    . FOOT SURGERY      There were no vitals filed for this visit.  Subjective Assessment - 03/29/18 2054    Subjective  Pt relays he is tired upon arrival, no significant pain today    Currently in Pain?  No/denies      Therex and neuro rehab performed today:  Nu step L5 UE/LE for endurance tandem walk in bars up/down X 5 sidestepping on mats up/down X5 march walking in bars up/down X 5 retro walking  In bars up/down X 5 stairs X 5,  curb and ramp inside up/down X 5,  resisted golf swing  PT Short Term Goals - 03/08/18 2118      PT SHORT TERM GOAL #1   Title  Patient reports doing his updated HEP at least 75% of the recommended frequency/reps. (Target all STGs 04/03/2018)    Time  4    Period  Weeks    Status  New    Target Date  04/03/18      PT SHORT TERM GOAL #2   Title  Patient to improve Berg to  >=49/56    Baseline  03/04/18  47/56    Time  4    Period  Weeks    Status  New      PT SHORT TERM GOAL #3   Title  Patient to ambulate over unlevel grass x 150 ft while carrying golf club (potentially using for balance/support) modified independent    Time  4    Period  Weeks    Status  New      PT SHORT TERM GOAL #4   Title  Patient able to stand in grass and perform 8 out of 10 "chip shot" swings without imbalance.     Time  4    Period  Weeks    Status  New      PT SHORT TERM GOAL #5   Title  Patient will have negative positional testing (for BPPV)    Baseline  03/08/18 negative sidelying and supine head roll    Time  4    Period  Weeks    Status  On-going        PT Long Term Goals - 03/05/18 4825  PT LONG TERM GOAL #1   Title  patient to be independent with advanced HEP. (Target all LTGs 05/03/2018)    Time  8    Period  Weeks    Status  New    Target Date  05/03/18      PT LONG TERM GOAL #2   Title  Patient will improve composite score on SOT to WNL.    Time  8    Period  Weeks    Status  New      PT LONG TERM GOAL #3   Title  Patient will ambulate 300 ft over grass terrain modified independent while carrying (and potentially using for balance) a golf club.    Time  8    Period  Weeks    Status  New      PT LONG TERM GOAL #4   Title  Patient will be able to bend down to set golf ball on tee, return to stand, and turn to set position for swing 5 of 5 trials with supervision.    Time  8    Period  Weeks    Status  New      PT LONG TERM GOAL #5   Title  Patient will be able to adapt near-full golf swing (with driver) without LOB 5 of 5 trials.     Time  8    Period  Weeks    Status  New            Plan - 03/29/18 2055    Clinical Impression Statement  Pt is making steady progress in all areas but contnues to lack endurance and still has mild balance and stregth deficits. Session again focused on balance, endurance, and strength. The weather did  not allow for balance and gait training outside on uneven grass or pre golf activities outside but instead her perfomed some balance training on mat pads for uneven surface and he performed resited simulated golf swing with theraband today. He shows good effort with PT but still has defecits with strength, endurance and balance and will continue to benefit from PT.  He will need progress note next visit.     Rehab Potential  Good    PT Frequency  2x / week    PT Duration  8 weeks    PT Treatment/Interventions  ADLs/Self Care Home Management;DME Instruction;Balance training;Therapeutic exercise;Therapeutic activities;Functional mobility training;Stair training;Gait training;Neuromuscular re-education;Patient/family education;Vestibular;Taping;Manual techniques;Passive range of motion;Visual/perceptual remediation/compensation;Canalith Repostioning    PT Next Visit Plan  Compliant surface walking, stepping strategies and swinging golf club;  turning in place, weight shifting, His goal is to get back on the golf course; when he sees [name] he wants him to review floor transfers with him again    Consulted and Agree with Plan of Care  Patient       Patient will benefit from skilled therapeutic intervention in order to improve the following deficits and impairments:  Abnormal gait, Decreased activity tolerance, Decreased balance, Difficulty walking, Decreased mobility, Postural dysfunction, Decreased endurance, Decreased knowledge of use of DME, Decreased strength, Dizziness, Impaired flexibility  Visit Diagnosis: Unsteadiness on feet  Abnormal posture  Other abnormalities of gait and mobility     Problem List Patient Active Problem List   Diagnosis Date Noted  . Diabetes mellitus 09/04/2010    Silvestre Mesi 03/29/2018, 9:01 PM  [facility] [address] [place], [name], [address] Phone: [phone]   Fax:   [phone]  Name: Alan Beltran MRN: [id number] Date of Birth: March 04, 1934

## 2018-04-01 ENCOUNTER — Ambulatory Visit: Payer: Medicare Other | Admitting: Physical Therapy

## 2018-04-01 ENCOUNTER — Encounter: Payer: Self-pay | Admitting: Physical Therapy

## 2018-04-01 DIAGNOSIS — R2681 Unsteadiness on feet: Secondary | ICD-10-CM

## 2018-04-01 DIAGNOSIS — R42 Dizziness and giddiness: Secondary | ICD-10-CM | POA: Diagnosis not present

## 2018-04-01 DIAGNOSIS — R2689 Other abnormalities of gait and mobility: Secondary | ICD-10-CM | POA: Diagnosis not present

## 2018-04-01 DIAGNOSIS — R293 Abnormal posture: Secondary | ICD-10-CM | POA: Diagnosis not present

## 2018-04-02 NOTE — Therapy (Signed)
Alan Beltran [address] [place], [name], [address] Phone: [phone]   Fax:  [phone]  Physical Therapy Treatment  Patient Details  Name: Alan Beltran MRN: [id number] Date of Birth: 1934/05/19 Referring Provider (PT): Dr. Vicie Mutters, MD   Encounter Date: 04/01/2018  PT End of Session - 04/01/18 1700    Visit Number  21    Number of Visits  35    Date for PT Re-Evaluation  05/03/18    Authorization Type  Medicare + [employer]    Authorization Time Period  03/04/18 to 05/03/18    PT Start Time  1535    PT Stop Time  1615    PT Time Calculation (min)  40 min    Activity Tolerance  Patient tolerated treatment well    Behavior During Therapy  [facility] for tasks assessed/performed       Past Medical History:  Diagnosis Date  . Asthma   . Diabetes mellitus     Past Surgical History:  Procedure Laterality Date  . EXTERNAL EAR SURGERY    . FOOT SURGERY      There were no vitals filed for this visit.  Subjective Assessment - 04/01/18 1700    Subjective  Patient states he did not contact [place] [facility] [employer] re: registering for golf clinics. (he was given print-outs re: adaptive golf clinics with their contact information). He thought it looked like lessons on learning to play golf which he didn't think he needs. On further discussion he requested PT forward his information to the contact at [facility].     Pertinent History  DM, hearing loss, neuropathy    Patient Stated Goals  "to get my balance to where I feel stronger - I want to get back to playing golf"    Currently in Pain?  No/denies                        Vestibular Treatment/Exercise - 04/01/18 1700      Vestibular Treatment/Exercise   Vestibular Treatment Provided  Gaze    Gaze Exercises  X1 Viewing Horizontal      X1 Viewing Horizontal   Foot Position  sitting    Reps  3    Comments  30 seconds; vc for increasing head  speed as long as target remains still      Gait training-on unlevel grass x 540 ft with light use of golf club (with stopping to hit 5 golf balls, then walking to retrieve golf balls--including pt squatting to pick up each ball)  Balance training- on unlevel grass, hitting golf balls with 7 iron (pt performing back swing to point of head of club above horizontal and then appropriate follow through). No LOB x 11 golf swings. Bending and picking up golf balls x 11.   Education-see subjective re: getting pt signed up for adaptive golf clinics through [employer] - 04/01/18 1700      PT SHORT TERM GOAL #1   Title  Patient reports doing his updated HEP at least 75% of the recommended frequency/reps. (Target all STGs 04/03/2018)    Baseline  04/01/18 Reports he does his HEP ~60% of the recommended frequency.    Time  4    Period  Weeks    Status  Not Met    Target Date  04/03/18      PT  SHORT TERM GOAL #2   Title  Patient to improve Berg to >=49/56    Baseline  03/04/18  47/56    Time  4    Period  Weeks    Status  Unable to assess      PT SHORT TERM GOAL #3   Title  Patient to ambulate over unlevel grass x 150 ft while carrying golf club (potentially using for balance/support) modified independent    Baseline  04/01/18 500 ft with light support from golf club modified independent    Time  4    Period  Weeks    Status  Achieved      PT SHORT TERM GOAL #4   Title  Patient able to stand in grass and perform 8 out of 10 "chip shot" swings without imbalance.     Baseline  04/01/18 Pt maintained balance 11 of 11     Time  4    Period  Weeks    Status  Achieved      PT SHORT TERM GOAL #5   Title  Patient will have negative positional testing (for BPPV)    Baseline  03/08/18 negative sidelying and supine head roll    Time  4    Period  Weeks    Status  Achieved        PT Long Term Goals - 04/01/18 1700      PT LONG TERM GOAL #1   Title  patient  to be independent with advanced HEP. (Target all LTGs 05/03/2018)    Time  8    Period  Weeks    Status  New      PT LONG TERM GOAL #2   Title  Patient will improve composite score on SOT to WNL.    Time  8    Period  Weeks    Status  New      PT LONG TERM GOAL #3   Title  Patient will ambulate 300 ft over grass terrain modified independent while carrying (and potentially using for balance) a golf club.    Baseline  04/01/18 540 ft    Time  8    Period  Weeks    Status  Achieved      PT LONG TERM GOAL #4   Title  Patient will be able to bend down to set golf ball on tee, return to stand, and turn to set position for swing 5 of 5 trials with supervision.    Time  8    Period  Weeks    Status  New      PT LONG TERM GOAL #5   Title  Patient will be able to adapt near-full golf swing (with driver) without LOB 5 of 5 trials.     Time  8    Period  Weeks    Status  New            Plan - 04/01/18 1700    Clinical Impression Statement  Assessed 4 out of 5 of his STGs (final goal to be assessed next visit) with pt meeting 3 goals and not meeting one goal. The goal he has not met is the frequency of completing his HEP and we discussed the purpose of each exercise and importance if he wants to continue to improve and return to playing golf. Overall he is making good progress and feels his legs have gotten stronger and his balance has improved. Will assess Berg Balance assessment next  visit for final STG.     Rehab Potential  Good    PT Frequency  2x / week    PT Duration  8 weeks    PT Treatment/Interventions  ADLs/Self Care Home Management;DME Instruction;Balance training;Therapeutic exercise;Therapeutic activities;Functional mobility training;Stair training;Gait training;Neuromuscular re-education;Patient/family education;Vestibular;Taping;Manual techniques;Passive range of motion;Visual/perceptual remediation/compensation;Canalith Repostioning    PT Next Visit Plan  chk final STG (do  Berg); Has he improved his compliance with doing HEP? Compliant surface walking, stepping strategies and swinging golf club;  turning in place, weight shifting, His goal is to get back on the golf course; when he sees [name] he wants him to review floor transfers with him again    Consulted and Agree with Plan of Care  Patient       Patient will benefit from skilled therapeutic intervention in order to improve the following deficits and impairments:  Abnormal gait, Decreased activity tolerance, Decreased balance, Difficulty walking, Decreased mobility, Postural dysfunction, Decreased endurance, Decreased knowledge of use of DME, Decreased strength, Dizziness, Impaired flexibility  Visit Diagnosis: Unsteadiness on feet  Other abnormalities of gait and mobility     Problem List Patient Active Problem List   Diagnosis Date Noted  . Diabetes mellitus 09/04/2010    Rexanne Mano, PT 04/02/2018, 7:37 AM  [facility] [address] [place], [name], [address] Phone: [phone]   Fax:  [phone]  Name: Alan Beltran MRN: [id number] Date of Birth: [birth year]

## 2018-04-05 ENCOUNTER — Ambulatory Visit: Payer: Medicare Other | Admitting: Physical Therapy

## 2018-04-05 DIAGNOSIS — R2689 Other abnormalities of gait and mobility: Secondary | ICD-10-CM

## 2018-04-05 DIAGNOSIS — R42 Dizziness and giddiness: Secondary | ICD-10-CM | POA: Diagnosis not present

## 2018-04-05 DIAGNOSIS — R293 Abnormal posture: Secondary | ICD-10-CM | POA: Diagnosis not present

## 2018-04-05 DIAGNOSIS — R2681 Unsteadiness on feet: Secondary | ICD-10-CM

## 2018-04-05 NOTE — Therapy (Signed)
[facility] [address] [place], [name], [address] Phone: [phone]   Fax:  [phone]  Physical Therapy Treatment  Patient Details  Name: Alan Beltran MRN: [id number] Date of Birth: 12/10/1934 Referring Provider (PT): Dr. Vicie Mutters, MD   Encounter Date: 04/05/2018  PT End of Session - 04/05/18 2156    Visit Number  22    Number of Visits  35    Date for PT Re-Evaluation  05/03/18    Authorization Type  Medicare + [employer]    Authorization Time Period  03/04/18 to 05/03/18    PT Start Time  1445    PT Stop Time  1530    PT Time Calculation (min)  45 min    Activity Tolerance  Patient tolerated treatment well    Behavior During Therapy  [facility] for tasks assessed/performed       Past Medical History:  Diagnosis Date  . Asthma   . Diabetes mellitus     Past Surgical History:  Procedure Laterality Date  . EXTERNAL EAR SURGERY    . FOOT SURGERY      There were no vitals filed for this visit.  Subjective Assessment - 04/05/18 2155    Subjective  no pain, just tired today    Patient Stated Goals  "to get my balance to where I feel stronger - I want to get back to playing golf"    Currently in Pain?  No/denies         [facility] PT Assessment - 04/05/18 0001      Assessment   Medical Diagnosis  Unsteadiness    Referring Provider (PT)  Dr. Vicie Mutters, MD      Merrilee Jansky Balance Test   Sit to Stand  Able to stand without using hands and stabilize independently    Standing Unsupported  Able to stand safely 2 minutes    Sitting with Back Unsupported but Feet Supported on Floor or Stool  Able to sit safely and securely 2 minutes    Stand to Sit  Sits safely with minimal use of hands    Transfers  Able to transfer safely, minor use of hands    Standing Unsupported with Eyes Closed  Able to stand 10 seconds safely    Standing Unsupported with Feet Together  Able to place feet together independently and stand 1  minute safely    From Standing, Reach Forward with Outstretched Arm  Can reach forward >12 cm safely (5")    From Standing Position, Pick up Object from Floor  Able to pick up shoe safely and easily    From Standing Position, Turn to Look Behind Over each Shoulder  Looks behind from both sides and weight shifts well    Turn 360 Degrees  Able to turn 360 degrees safely in 4 seconds or less    Standing Unsupported, Alternately Place Feet on Step/Stool  Able to stand independently and safely and complete 8 steps in 20 seconds    Standing Unsupported, One Foot in Front  Able to take small step independently and hold 30 seconds    Standing on One Leg  Able to lift leg independently and hold equal to or more than 3 seconds    Total Score  51       Therex and neuro rehab performed today: nu step intervals 8 min total 30 sec easy, 30 sec fast L5 UE/LE BERG balance test see above for details Sit to stand  no UE 2X10 Walking on uneven grass outside hitting golf balls 2 balls at a time then would walk down to pick them up off the ground and then continue 20 reps total.    PT Short Term Goals - 04/01/18 1700      PT SHORT TERM GOAL #1   Title  Patient reports doing his updated HEP at least 75% of the recommended frequency/reps. (Target all STGs 04/03/2018)    Baseline  04/01/18 Reports he does his HEP ~60% of the recommended frequency.    Time  4    Period  Weeks    Status  Not Met    Target Date  04/03/18      PT SHORT TERM GOAL #2   Title  Patient to improve Berg to >=49/56    Baseline  03/04/18  47/56    Time  4    Period  Weeks    Status  Unable to assess      PT SHORT TERM GOAL #3   Title  Patient to ambulate over unlevel grass x 150 ft while carrying golf club (potentially using for balance/support) modified independent    Baseline  04/01/18 500 ft with light support from golf club modified independent    Time  4    Period  Weeks    Status  Achieved      PT SHORT TERM GOAL #4   Title   Patient able to stand in grass and perform 8 out of 10 "chip shot" swings without imbalance.     Baseline  04/01/18 Pt maintained balance 11 of 11     Time  4    Period  Weeks    Status  Achieved      PT SHORT TERM GOAL #5   Title  Patient will have negative positional testing (for BPPV)    Baseline  03/08/18 negative sidelying and supine head roll    Time  4    Period  Weeks    Status  Achieved        PT Long Term Goals - 04/01/18 1700      PT LONG TERM GOAL #1   Title  patient to be independent with advanced HEP. (Target all LTGs 05/03/2018)    Time  8    Period  Weeks    Status  New      PT LONG TERM GOAL #2   Title  Patient will improve composite score on SOT to WNL.    Time  8    Period  Weeks    Status  New      PT LONG TERM GOAL #3   Title  Patient will ambulate 300 ft over grass terrain modified independent while carrying (and potentially using for balance) a golf club.    Baseline  04/01/18 540 ft    Time  8    Period  Weeks    Status  Achieved      PT LONG TERM GOAL #4   Title  Patient will be able to bend down to set golf ball on tee, return to stand, and turn to set position for swing 5 of 5 trials with supervision.    Time  8    Period  Weeks    Status  New      PT LONG TERM GOAL #5   Title  Patient will be able to adapt near-full golf swing (with driver) without LOB 5 of 5 trials.  Time  8    Period  Weeks    Status  New            Plan - 04/05/18 2158    Clinical Impression Statement  BERG balance test performed today and he showed significant improvement with his balance. After this session focused on endurance, dynamic balance with walking on uneven terrain, and lumbar-thoracic mobiliy with golf swings.     Rehab Potential  Good    PT Frequency  2x / week    PT Duration  8 weeks    PT Treatment/Interventions  ADLs/Self Care Home Management;DME Instruction;Balance training;Therapeutic exercise;Therapeutic activities;Functional mobility  training;Stair training;Gait training;Neuromuscular re-education;Patient/family education;Vestibular;Taping;Manual techniques;Passive range of motion;Visual/perceptual remediation/compensation;Canalith Repostioning    PT Next Visit Plan  Has he improved his compliance with doing HEP? Compliant surface walking, stepping strategies and swinging golf club;  turning in place, weight shifting, His goal is to get back on the golf course; when he sees [name] he wants him to review floor transfers with him again    Consulted and Agree with Plan of Care  Patient       Patient will benefit from skilled therapeutic intervention in order to improve the following deficits and impairments:  Abnormal gait, Decreased activity tolerance, Decreased balance, Difficulty walking, Decreased mobility, Postural dysfunction, Decreased endurance, Decreased knowledge of use of DME, Decreased strength, Dizziness, Impaired flexibility  Visit Diagnosis: Unsteadiness on feet  Other abnormalities of gait and mobility  Abnormal posture     Problem List Patient Active Problem List   Diagnosis Date Noted  . Diabetes mellitus 09/04/2010    Silvestre Mesi 04/05/2018, 10:25 PM  [facility] [address] [place], [name], [address] Phone: [phone]   Fax:  [phone]  Name: Alan Beltran MRN: [id number] Date of Birth: Apr 13, 1934

## 2018-04-06 ENCOUNTER — Ambulatory Visit (INDEPENDENT_AMBULATORY_CARE_PROVIDER_SITE_OTHER): Payer: Medicare Other | Admitting: Sports Medicine

## 2018-04-06 ENCOUNTER — Encounter: Payer: Self-pay | Admitting: Sports Medicine

## 2018-04-06 ENCOUNTER — Other Ambulatory Visit: Payer: Self-pay

## 2018-04-06 VITALS — BP 116/61 | HR 98 | Resp 16

## 2018-04-06 DIAGNOSIS — B351 Tinea unguium: Secondary | ICD-10-CM | POA: Diagnosis not present

## 2018-04-06 DIAGNOSIS — E1140 Type 2 diabetes mellitus with diabetic neuropathy, unspecified: Secondary | ICD-10-CM

## 2018-04-06 DIAGNOSIS — M79676 Pain in unspecified toe(s): Secondary | ICD-10-CM

## 2018-04-06 NOTE — Progress Notes (Signed)
Patient ID: Alan Beltran, male   DOB: Oct 02, 1934, 84 y.o.   MRN: [id number] Subjective: Alan Beltran is a 84 y.o. male patient with history of diabetes who returns to office today complaining of long, painful nails  while ambulating in shoes; unable to trim. Patient states that the glucose reading was 188 last A1c doesn't recall and was seen last by his primary care doctor Dr. Venetia Maxon 3 months ago.  Reports that tea tree oil is helping at his fingernails. Patient denies any other new changes in medication or other new problems since last visit.   Patient Active Problem List   Diagnosis Date Noted  . Diabetes mellitus 09/04/2010   Current Outpatient Medications on File Prior to Visit  Medication Sig Dispense Refill  . ALLOPURINOL PO Take by mouth.      . FOLIC ACID PO Take by mouth.      . Levothyroxine Sodium (SYNTHROID PO) Take by mouth.      . metFORMIN (GLUCOPHAGE-XR) 500 MG 24 hr tablet TAKE TWO TABLETS BY MOUTH ONCE DAILY WITH EVENING MEAL  1  . NOVOLIN N RELION 100 UNIT/ML injection INJECT 10 UNITS SUBCUTANEOUSLY ONCE DAILY AS DIRECTED AT BEDTIME FOR 30 DAYS    . OMEPRAZOLE PO Take by mouth.      . Pioglitazone HCl (ACTOS PO) Take by mouth.      . Rosuvastatin Calcium (CRESTOR PO) Take by mouth.      . SERTRALINE HCL PO Take by mouth.      . SPIRONOLACTONE PO Take by mouth.      . TERAZOSIN HCL PO Take by mouth.      . TRUEPLUS LANCETS 33G MISC   5  . TRUETRACK TEST test strip   4  . WARFARIN SODIUM PO Take by mouth.      . Zinc Sulfate (ZINC 15 PO) Take by mouth.       No current facility-administered medications on file prior to visit.    No Known Allergies  No results found for this or any previous visit (from the past 2160 hour(s)).  Objective: General: Patient is awake, alert, and oriented x 3 and in no acute distress.  Integument: Skin is warm, dry and supple bilateral. Nails are tender, long, thickened and dystrophic with subungual debris, consistent with onychomycosis, 1-5  bilateral.  No open lesions or preulcerative lesions present bilateral. Remaining integument unremarkable.  Vasculature:  Dorsalis Pedis pulse 1/4 bilateral. Posterior Tibial pulse  1/4 bilateral. Capillary fill time <3 sec 1-5 bilateral. Diminished hair growth to the level of the digits.Temperature gradient within normal limits. No varicosities present bilateral.  Trace chronic  edema present bilateral.   Neurology: The patient has intact sensation measured with a 5.07/10g Semmes Weinstein Monofilament at all pedal sites bilateral. Vibratory sensation diminished bilateral with tuning fork. No Babinski sign present bilateral.   Musculoskeletal: Asymptomatic hammertoe pedal deformities noted bilateral. Muscular strength 5/5 in all lower extremity muscular groups bilateral without pain on range of motion. No tenderness with calf compression bilateral.  Assessment and Plan: Problem List Items Addressed This Visit    None    Visit Diagnoses    Pain due to onychomycosis of toenail    -  Primary   Type 2 diabetes, controlled, with neuropathy (HCC)       Relevant Medications   NOVOLIN N RELION 100 UNIT/ML injection     -Examined patient. -Discussed and educated patient on diabetic foot care, especially with  regards to the vascular, neurological and  musculoskeletal systems. -Mechanically debrided all nails 1-5 bilateral using sterile nail nipper and filed with dremel without incident  -Patient to return in 3 months for at risk foot/diabetic nail care -Patient advised to call the office if any problems or questions arise in the meantime.  Landis Martins, DPM

## 2018-04-07 ENCOUNTER — Ambulatory Visit: Payer: Medicare Other | Admitting: Physical Therapy

## 2018-04-07 ENCOUNTER — Encounter: Payer: Self-pay | Admitting: Physical Therapy

## 2018-04-07 DIAGNOSIS — R2681 Unsteadiness on feet: Secondary | ICD-10-CM

## 2018-04-07 DIAGNOSIS — R293 Abnormal posture: Secondary | ICD-10-CM | POA: Diagnosis not present

## 2018-04-07 DIAGNOSIS — R42 Dizziness and giddiness: Secondary | ICD-10-CM

## 2018-04-07 DIAGNOSIS — R2689 Other abnormalities of gait and mobility: Secondary | ICD-10-CM | POA: Diagnosis not present

## 2018-04-08 NOTE — Therapy (Signed)
[facility] [address] [place], [name], [address] Phone: [phone]   Fax:  [phone]  Physical Therapy Treatment  Patient Details  Name: Alan Beltran MRN: [id number] Date of Birth: 13-Apr-1934 Referring Provider (PT): Dr. Vicie Mutters, MD   Encounter Date: 04/07/2018  PT End of Session - 04/07/18 1531    Visit Number  [id number]    Number of Visits  35    Date for PT Re-Evaluation  05/03/18    Authorization Type  Medicare + [employer]    Authorization Time Period  03/04/18 to 05/03/18    PT Start Time  1450    PT Stop Time  1530    PT Time Calculation (min)  40 min    Activity Tolerance  Patient tolerated treatment well    Behavior During Therapy  [facility] for tasks assessed/performed       Past Medical History:  Diagnosis Date  . Asthma   . Diabetes mellitus     Past Surgical History:  Procedure Laterality Date  . EXTERNAL EAR SURGERY    . FOOT SURGERY      There were no vitals filed for this visit.  Subjective Assessment - 04/07/18 1455    Subjective  no pain, doing a little better with his HEP    Pertinent History  DM, hearing loss, neuropathy    Patient Stated Goals  "to get my balance to where I feel stronger - I want to get back to playing golf"    Currently in Pain?  No/denies                       [facility] Adult PT Treatment/Exercise - 04/07/18 1512      Knee/Hip Exercises: Machines for Strengthening   Cybex Knee Extension  60# x 10 reps x 2 sets      Knee/Hip Exercises: Standing   Other Standing Knee Exercises  left foot standing on long black band, pt holding other end of band as he would a golf club and practicing golf swing motion (and follow through) for balance challenge 10 reps x 2 sets with varying speed          Balance Exercises - 04/07/18 1700      Balance Exercises: Standing   Standing Eyes Closed  Wide (BOA);Foam/compliant surface    Stepping Strategy   Anterior;Posterior;Foam/compliant surface;10 reps   level & on ramp; pt gets confused which foot to step   Balance Beam  black beam crosswise, head turns, head nods    Retro Gait  3 reps   blue mat, no UE support   Sidestepping  Foam/compliant support   up/down ramp with blue mat   Turning  Both;10 reps   on blue mat inside // bars with no LOB       PT Education - 04/08/18 1406    Education Details  again emphasized importance of HEP    Person(s) Educated  Patient    Methods  Explanation    Comprehension  Verbalized understanding       PT Short Term Goals - 04/01/18 1700      PT SHORT TERM GOAL #1   Title  Patient reports doing his updated HEP at least 75% of the recommended frequency/reps. (Target all STGs 04/03/2018)    Baseline  04/01/18 Reports he does his HEP ~60% of the recommended frequency.    Time  4    Period  Weeks  Status  Not Met    Target Date  04/03/18      PT SHORT TERM GOAL #2   Title  Patient to improve Berg to >=49/56    Baseline  03/04/18  47/56    Time  4    Period  Weeks    Status  Unable to assess      PT SHORT TERM GOAL #3   Title  Patient to ambulate over unlevel grass x 150 ft while carrying golf club (potentially using for balance/support) modified independent    Baseline  04/01/18 500 ft with light support from golf club modified independent    Time  4    Period  Weeks    Status  Achieved      PT SHORT TERM GOAL #4   Title  Patient able to stand in grass and perform 8 out of 10 "chip shot" swings without imbalance.     Baseline  04/01/18 Pt maintained balance 11 of 11     Time  4    Period  Weeks    Status  Achieved      PT SHORT TERM GOAL #5   Title  Patient will have negative positional testing (for BPPV)    Baseline  03/08/18 negative sidelying and supine head roll    Time  4    Period  Weeks    Status  Achieved        PT Long Term Goals - 04/01/18 1700      PT LONG TERM GOAL #1   Title  patient to be independent with advanced  HEP. (Target all LTGs 05/03/2018)    Time  8    Period  Weeks    Status  New      PT LONG TERM GOAL #2   Title  Patient will improve composite score on SOT to WNL.    Time  8    Period  Weeks    Status  New      PT LONG TERM GOAL #3   Title  Patient will ambulate 300 ft over grass terrain modified independent while carrying (and potentially using for balance) a golf club.    Baseline  04/01/18 540 ft    Time  8    Period  Weeks    Status  Achieved      PT LONG TERM GOAL #4   Title  Patient will be able to bend down to set golf ball on tee, return to stand, and turn to set position for swing 5 of 5 trials with supervision.    Time  8    Period  Weeks    Status  New      PT LONG TERM GOAL #5   Title  Patient will be able to adapt near-full golf swing (with driver) without LOB 5 of 5 trials.     Time  8    Period  Weeks    Status  New            Plan - 04/08/18 1408    Clinical Impression Statement  Entire session focusing on balance reactions and strategies, including on compliant surfaces, with narrowing base of support, and unlevel surfaces (ramp). Patient requested one seated rest after practicing simulated golf swings with black band. Patient continues to improve as he works toward personal goal to be able to play golf with his grandson.     Rehab Potential  Good    PT Frequency  2x / week    PT Duration  8 weeks    PT Treatment/Interventions  ADLs/Self Care Home Management;DME Instruction;Balance training;Therapeutic exercise;Therapeutic activities;Functional mobility training;Stair training;Gait training;Neuromuscular re-education;Patient/family education;Vestibular;Taping;Manual techniques;Passive range of motion;Visual/perceptual remediation/compensation;Canalith Repostioning    PT Next Visit Plan  Has he improved his compliance with doing HEP? Compliant surface walking, stepping strategies; swinging golf club; reaching to floor in standing (simulate putting ball on the  tee); turning in place, weight shifting, His goal is to get back on the golf course; when he sees [name] he wants him to review floor transfers with him again    Consulted and Agree with Plan of Care  Patient       Patient will benefit from skilled therapeutic intervention in order to improve the following deficits and impairments:  Abnormal gait, Decreased activity tolerance, Decreased balance, Difficulty walking, Decreased mobility, Postural dysfunction, Decreased endurance, Decreased knowledge of use of DME, Decreased strength, Dizziness, Impaired flexibility  Visit Diagnosis: Unsteadiness on feet  Dizziness and giddiness     Problem List Patient Active Problem List   Diagnosis Date Noted  . Diabetes mellitus 09/04/2010    Rexanne Mano, PT 04/08/2018, 2:13 PM  [facility] [address] [place], [name], [address] Phone: [phone]   Fax:  [phone]  Name: Gevorg Brum MRN: [id number] Date of Birth: July 31, 1934

## 2018-04-12 ENCOUNTER — Ambulatory Visit: Payer: Medicare Other | Admitting: Physical Therapy

## 2018-04-12 DIAGNOSIS — E538 Deficiency of other specified B group vitamins: Secondary | ICD-10-CM | POA: Diagnosis not present

## 2018-04-12 DIAGNOSIS — E291 Testicular hypofunction: Secondary | ICD-10-CM | POA: Diagnosis not present

## 2018-04-14 ENCOUNTER — Ambulatory Visit: Payer: Medicare Other | Admitting: Physical Therapy

## 2018-04-15 DIAGNOSIS — B356 Tinea cruris: Secondary | ICD-10-CM | POA: Diagnosis not present

## 2018-04-15 DIAGNOSIS — S0101XS Laceration without foreign body of scalp, sequela: Secondary | ICD-10-CM | POA: Diagnosis not present

## 2018-04-15 DIAGNOSIS — E1159 Type 2 diabetes mellitus with other circulatory complications: Secondary | ICD-10-CM | POA: Diagnosis not present

## 2018-04-15 DIAGNOSIS — N481 Balanitis: Secondary | ICD-10-CM | POA: Diagnosis not present

## 2018-04-18 ENCOUNTER — Encounter: Payer: Self-pay | Admitting: Rehabilitative and Restorative Service Providers"

## 2018-04-19 ENCOUNTER — Ambulatory Visit: Payer: Medicare Other | Admitting: Physical Therapy

## 2018-04-19 ENCOUNTER — Telehealth: Payer: Self-pay | Admitting: Physical Therapy

## 2018-04-19 NOTE — Telephone Encounter (Signed)
Spoke with patient's wife today regarding the temporary closing of [facility] due to [place].  Therapist discussed:  Continuing his HEP  [facility] will follow up with patients when we are able to resume care.  Barry Brunner, [place] [address] [place], [place]  [address] Phone:  [phone] Fax:  [phone]

## 2018-04-21 ENCOUNTER — Ambulatory Visit: Payer: Medicare Other | Admitting: Physical Therapy

## 2018-04-22 ENCOUNTER — Ambulatory Visit: Payer: Medicare Other | Admitting: Rehabilitative and Restorative Service Providers"

## 2018-04-26 ENCOUNTER — Ambulatory Visit: Payer: Medicare Other | Admitting: Physical Therapy

## 2018-04-26 DIAGNOSIS — E291 Testicular hypofunction: Secondary | ICD-10-CM | POA: Diagnosis not present

## 2018-04-26 DIAGNOSIS — E538 Deficiency of other specified B group vitamins: Secondary | ICD-10-CM | POA: Diagnosis not present

## 2018-05-04 ENCOUNTER — Telehealth: Payer: Self-pay | Admitting: Physical Therapy

## 2018-05-04 NOTE — Telephone Encounter (Signed)
Alan Beltran was contacted today regarding the temporary closing of [facility] due to Covid-19. Spoke via telephone with pt's wife. Therapist discussed current HEP and option for pt to participate in telehealth visit.  Wife reported she did not think pt would want this service, but stated she would discuss this option with him and have him call and leave message if he is indeed interested in participating in this service.  Patient will call if he is interested in further information for an e-visit, virtual check in, or telehealth visit.     [facility] will follow up with patients when we are able to resume care.  Guido Sander, [place] [address] [place], [place]  [address] Phone:  [phone] Fax:  [phone] \

## 2018-05-10 DIAGNOSIS — E538 Deficiency of other specified B group vitamins: Secondary | ICD-10-CM | POA: Diagnosis not present

## 2018-05-10 DIAGNOSIS — E291 Testicular hypofunction: Secondary | ICD-10-CM | POA: Diagnosis not present

## 2018-05-24 DIAGNOSIS — E291 Testicular hypofunction: Secondary | ICD-10-CM | POA: Diagnosis not present

## 2018-05-24 DIAGNOSIS — E538 Deficiency of other specified B group vitamins: Secondary | ICD-10-CM | POA: Diagnosis not present

## 2018-05-26 DIAGNOSIS — I259 Chronic ischemic heart disease, unspecified: Secondary | ICD-10-CM | POA: Diagnosis not present

## 2018-05-26 DIAGNOSIS — I4891 Unspecified atrial fibrillation: Secondary | ICD-10-CM | POA: Diagnosis not present

## 2018-06-03 DIAGNOSIS — N400 Enlarged prostate without lower urinary tract symptoms: Secondary | ICD-10-CM | POA: Diagnosis not present

## 2018-06-03 DIAGNOSIS — E039 Hypothyroidism, unspecified: Secondary | ICD-10-CM | POA: Diagnosis not present

## 2018-06-03 DIAGNOSIS — D519 Vitamin B12 deficiency anemia, unspecified: Secondary | ICD-10-CM | POA: Diagnosis not present

## 2018-06-03 DIAGNOSIS — Z79899 Other long term (current) drug therapy: Secondary | ICD-10-CM | POA: Diagnosis not present

## 2018-06-03 DIAGNOSIS — E785 Hyperlipidemia, unspecified: Secondary | ICD-10-CM | POA: Diagnosis not present

## 2018-06-03 DIAGNOSIS — E1159 Type 2 diabetes mellitus with other circulatory complications: Secondary | ICD-10-CM | POA: Diagnosis not present

## 2018-06-07 DIAGNOSIS — E538 Deficiency of other specified B group vitamins: Secondary | ICD-10-CM | POA: Diagnosis not present

## 2018-06-07 DIAGNOSIS — E291 Testicular hypofunction: Secondary | ICD-10-CM | POA: Diagnosis not present

## 2018-06-21 DIAGNOSIS — E538 Deficiency of other specified B group vitamins: Secondary | ICD-10-CM | POA: Diagnosis not present

## 2018-06-21 DIAGNOSIS — E291 Testicular hypofunction: Secondary | ICD-10-CM | POA: Diagnosis not present

## 2018-06-23 ENCOUNTER — Telehealth: Payer: Self-pay | Admitting: Rehabilitative and Restorative Service Providers"

## 2018-06-23 DIAGNOSIS — H40053 Ocular hypertension, bilateral: Secondary | ICD-10-CM | POA: Diagnosis not present

## 2018-06-23 NOTE — Telephone Encounter (Signed)
Called to discuss Alan Beltran's mobility status and need for in clinic therapy as we look to expand our services in June and July.  Left voicemail for patient to call our clinic.  Sheli Dorin, PT

## 2018-06-25 DIAGNOSIS — E1159 Type 2 diabetes mellitus with other circulatory complications: Secondary | ICD-10-CM | POA: Diagnosis not present

## 2018-06-25 DIAGNOSIS — N400 Enlarged prostate without lower urinary tract symptoms: Secondary | ICD-10-CM | POA: Diagnosis not present

## 2018-06-28 ENCOUNTER — Ambulatory Visit (INDEPENDENT_AMBULATORY_CARE_PROVIDER_SITE_OTHER): Payer: Medicare Other | Admitting: Sports Medicine

## 2018-06-28 ENCOUNTER — Other Ambulatory Visit: Payer: Self-pay

## 2018-06-28 ENCOUNTER — Encounter: Payer: Self-pay | Admitting: Sports Medicine

## 2018-06-28 DIAGNOSIS — E1140 Type 2 diabetes mellitus with diabetic neuropathy, unspecified: Secondary | ICD-10-CM

## 2018-06-28 DIAGNOSIS — M2042 Other hammer toe(s) (acquired), left foot: Secondary | ICD-10-CM

## 2018-06-28 DIAGNOSIS — M2041 Other hammer toe(s) (acquired), right foot: Secondary | ICD-10-CM

## 2018-06-28 NOTE — Progress Notes (Signed)
Patient ID: Alan Beltran, male   DOB: 1934/08/24, 84 y.o.   MRN: [id number] Patient presents for diabetic shoe pick up, shoes are tried on for good fit.  Patient received 1 Pair and 3 pairs custom molded diabetic inserts.  Verbal and written break in and wear instructions given.  Patient will follow up for scheduled routine care.

## 2018-06-28 NOTE — Progress Notes (Signed)
Patient discussed with medical assistant. Agree with her note. Patient to follow up as scheduled for continued care or sooner if problems or issues arise. -Dr. Rahi Chandonnet  

## 2018-07-04 DIAGNOSIS — E291 Testicular hypofunction: Secondary | ICD-10-CM | POA: Diagnosis not present

## 2018-07-04 DIAGNOSIS — E538 Deficiency of other specified B group vitamins: Secondary | ICD-10-CM | POA: Diagnosis not present

## 2018-07-06 ENCOUNTER — Other Ambulatory Visit: Payer: Self-pay

## 2018-07-06 ENCOUNTER — Ambulatory Visit (INDEPENDENT_AMBULATORY_CARE_PROVIDER_SITE_OTHER): Payer: Medicare Other | Admitting: Sports Medicine

## 2018-07-06 ENCOUNTER — Encounter: Payer: Self-pay | Admitting: Sports Medicine

## 2018-07-06 VITALS — Temp 98.0°F | Resp 16

## 2018-07-06 DIAGNOSIS — I739 Peripheral vascular disease, unspecified: Secondary | ICD-10-CM

## 2018-07-06 DIAGNOSIS — M79676 Pain in unspecified toe(s): Secondary | ICD-10-CM

## 2018-07-06 DIAGNOSIS — E1140 Type 2 diabetes mellitus with diabetic neuropathy, unspecified: Secondary | ICD-10-CM | POA: Diagnosis not present

## 2018-07-06 DIAGNOSIS — B351 Tinea unguium: Secondary | ICD-10-CM | POA: Diagnosis not present

## 2018-07-06 NOTE — Progress Notes (Signed)
Patient ID: Alan Beltran, male   DOB: 1934-08-26, 84 y.o.   MRN: [id number] Subjective: Alan Beltran is a 84 y.o. male patient with history of diabetes who returns to office today complaining of long, painful nails  while ambulating in shoes; unable to trim. Patient states that the glucose reading was 154 and last A1c of 9.  Reports that he did have an episode of pain at the right great toe but now it has resolved.  Patient reports that he is on allopurinol for gout and is unsure if he had a gout attack but it did help when he took it.  Patient also states again that the tea tree oil is helping at his fingernails. Patient denies any other new changes in medication or other new problems since last visit.  No other issues noted.  Patient Active Problem List   Diagnosis Date Noted  . Diabetes mellitus 09/04/2010   Current Outpatient Medications on File Prior to Visit  Medication Sig Dispense Refill  . ALLOPURINOL PO Take by mouth.      . FOLIC ACID PO Take by mouth.      . Levothyroxine Sodium (SYNTHROID PO) Take by mouth.      . metFORMIN (GLUCOPHAGE-XR) 500 MG 24 hr tablet TAKE TWO TABLETS BY MOUTH ONCE DAILY WITH EVENING MEAL  1  . NOVOLIN N RELION 100 UNIT/ML injection INJECT 10 UNITS SUBCUTANEOUSLY ONCE DAILY AS DIRECTED AT BEDTIME FOR 30 DAYS    . OMEPRAZOLE PO Take by mouth.      . Pioglitazone HCl (ACTOS PO) Take by mouth.      . Rosuvastatin Calcium (CRESTOR PO) Take by mouth.      . SERTRALINE HCL PO Take by mouth.      . SPIRONOLACTONE PO Take by mouth.      . TERAZOSIN HCL PO Take by mouth.      . TRUEPLUS LANCETS 33G MISC   5  . TRUETRACK TEST test strip   4  . WARFARIN SODIUM PO Take by mouth.      . Zinc Sulfate (ZINC 15 PO) Take by mouth.       No current facility-administered medications on file prior to visit.    No Known Allergies  No results found for this or any previous visit (from the past 2160 hour(s)).  Objective: General: Patient is awake, alert, and oriented x 3  and in no acute distress.  Integument: Skin is warm, dry and supple bilateral. Nails are tender, long, thickened and dystrophic with subungual debris, consistent with onychomycosis, 1-5 bilateral.  No open lesions or preulcerative lesions present bilateral. Remaining integument unremarkable.  Vasculature:  Dorsalis Pedis pulse 1/4 bilateral. Posterior Tibial pulse  0/4 bilateral. Capillary fill time <3 sec 1-5 bilateral. Diminished hair growth to the level of the digits.Temperature gradient within normal limits. No varicosities present bilateral.  Trace chronic edema present bilateral.   Neurology: The patient has intact sensation measured with a 5.07/10g Semmes Weinstein Monofilament at all pedal sites bilateral. Vibratory sensation diminished bilateral with tuning fork. No Babinski sign present bilateral.   Musculoskeletal: Asymptomatic hammertoe pedal deformities noted bilateral. Muscular strength 5/5 in all lower extremity muscular groups bilateral without pain on range of motion. No tenderness with calf compression bilateral.  Assessment and Plan: Problem List Items Addressed This Visit    None    Visit Diagnoses    Pain due to onychomycosis of toenail    -  Primary   Type 2 diabetes, controlled, with  neuropathy ([place])       PVD (peripheral vascular disease) ([place])         -Examined patient. -Discussed and educated patient on diabetic foot care, especially with  regards to the vascular, neurological and musculoskeletal systems. -Mechanically debrided all nails 1-5 bilateral using sterile nail nipper and filed with dremel without incident  -Continue with good supportive/diabetic shoes -Patient to return in 3 months for at risk foot/diabetic nail care -Patient advised to call the office if any problems or questions arise in the meantime.  Landis Martins, DPM

## 2018-07-19 DIAGNOSIS — E291 Testicular hypofunction: Secondary | ICD-10-CM | POA: Diagnosis not present

## 2018-07-19 DIAGNOSIS — E538 Deficiency of other specified B group vitamins: Secondary | ICD-10-CM | POA: Diagnosis not present

## 2018-07-26 DIAGNOSIS — N400 Enlarged prostate without lower urinary tract symptoms: Secondary | ICD-10-CM | POA: Diagnosis not present

## 2018-07-26 DIAGNOSIS — E039 Hypothyroidism, unspecified: Secondary | ICD-10-CM | POA: Diagnosis not present

## 2018-08-02 DIAGNOSIS — E291 Testicular hypofunction: Secondary | ICD-10-CM | POA: Diagnosis not present

## 2018-08-02 DIAGNOSIS — E538 Deficiency of other specified B group vitamins: Secondary | ICD-10-CM | POA: Diagnosis not present

## 2018-08-04 DIAGNOSIS — I2510 Atherosclerotic heart disease of native coronary artery without angina pectoris: Secondary | ICD-10-CM | POA: Diagnosis not present

## 2018-08-04 DIAGNOSIS — E785 Hyperlipidemia, unspecified: Secondary | ICD-10-CM | POA: Diagnosis not present

## 2018-08-04 DIAGNOSIS — I4811 Longstanding persistent atrial fibrillation: Secondary | ICD-10-CM | POA: Diagnosis not present

## 2018-08-05 DIAGNOSIS — H26493 Other secondary cataract, bilateral: Secondary | ICD-10-CM | POA: Diagnosis not present

## 2018-08-16 DIAGNOSIS — E538 Deficiency of other specified B group vitamins: Secondary | ICD-10-CM | POA: Diagnosis not present

## 2018-08-16 DIAGNOSIS — E291 Testicular hypofunction: Secondary | ICD-10-CM | POA: Diagnosis not present

## 2018-08-26 DIAGNOSIS — K219 Gastro-esophageal reflux disease without esophagitis: Secondary | ICD-10-CM | POA: Diagnosis not present

## 2018-08-26 DIAGNOSIS — E039 Hypothyroidism, unspecified: Secondary | ICD-10-CM | POA: Diagnosis not present

## 2018-08-26 DIAGNOSIS — J449 Chronic obstructive pulmonary disease, unspecified: Secondary | ICD-10-CM | POA: Diagnosis not present

## 2018-08-31 DIAGNOSIS — E538 Deficiency of other specified B group vitamins: Secondary | ICD-10-CM | POA: Diagnosis not present

## 2018-08-31 DIAGNOSIS — E291 Testicular hypofunction: Secondary | ICD-10-CM | POA: Diagnosis not present

## 2018-09-01 DIAGNOSIS — C44619 Basal cell carcinoma of skin of left upper limb, including shoulder: Secondary | ICD-10-CM | POA: Diagnosis not present

## 2018-09-01 DIAGNOSIS — L821 Other seborrheic keratosis: Secondary | ICD-10-CM | POA: Diagnosis not present

## 2018-09-01 DIAGNOSIS — L570 Actinic keratosis: Secondary | ICD-10-CM | POA: Diagnosis not present

## 2018-09-01 DIAGNOSIS — L578 Other skin changes due to chronic exposure to nonionizing radiation: Secondary | ICD-10-CM | POA: Diagnosis not present

## 2018-09-01 DIAGNOSIS — D225 Melanocytic nevi of trunk: Secondary | ICD-10-CM | POA: Diagnosis not present

## 2018-09-13 DIAGNOSIS — E538 Deficiency of other specified B group vitamins: Secondary | ICD-10-CM | POA: Diagnosis not present

## 2018-09-13 DIAGNOSIS — E291 Testicular hypofunction: Secondary | ICD-10-CM | POA: Diagnosis not present

## 2018-09-26 DIAGNOSIS — E785 Hyperlipidemia, unspecified: Secondary | ICD-10-CM | POA: Diagnosis not present

## 2018-09-26 DIAGNOSIS — I10 Essential (primary) hypertension: Secondary | ICD-10-CM | POA: Diagnosis not present

## 2018-09-26 DIAGNOSIS — E1159 Type 2 diabetes mellitus with other circulatory complications: Secondary | ICD-10-CM | POA: Diagnosis not present

## 2018-09-27 DIAGNOSIS — E291 Testicular hypofunction: Secondary | ICD-10-CM | POA: Diagnosis not present

## 2018-09-27 DIAGNOSIS — E538 Deficiency of other specified B group vitamins: Secondary | ICD-10-CM | POA: Diagnosis not present

## 2018-10-04 DIAGNOSIS — E039 Hypothyroidism, unspecified: Secondary | ICD-10-CM | POA: Diagnosis not present

## 2018-10-04 DIAGNOSIS — E1159 Type 2 diabetes mellitus with other circulatory complications: Secondary | ICD-10-CM | POA: Diagnosis not present

## 2018-10-04 DIAGNOSIS — E785 Hyperlipidemia, unspecified: Secondary | ICD-10-CM | POA: Diagnosis not present

## 2018-10-04 DIAGNOSIS — Z79899 Other long term (current) drug therapy: Secondary | ICD-10-CM | POA: Diagnosis not present

## 2018-10-05 ENCOUNTER — Ambulatory Visit (INDEPENDENT_AMBULATORY_CARE_PROVIDER_SITE_OTHER): Payer: Medicare Other | Admitting: Sports Medicine

## 2018-10-05 ENCOUNTER — Encounter: Payer: Self-pay | Admitting: Sports Medicine

## 2018-10-05 ENCOUNTER — Other Ambulatory Visit: Payer: Self-pay

## 2018-10-05 DIAGNOSIS — B351 Tinea unguium: Secondary | ICD-10-CM | POA: Diagnosis not present

## 2018-10-05 DIAGNOSIS — I739 Peripheral vascular disease, unspecified: Secondary | ICD-10-CM

## 2018-10-05 DIAGNOSIS — M79676 Pain in unspecified toe(s): Secondary | ICD-10-CM

## 2018-10-05 DIAGNOSIS — E1140 Type 2 diabetes mellitus with diabetic neuropathy, unspecified: Secondary | ICD-10-CM

## 2018-10-05 NOTE — Progress Notes (Signed)
Patient ID: Alan Beltran, male   DOB: 1935-01-23, 84 y.o.   MRN: [id number] Subjective: Alan Beltran is a 84 y.o. male patient with history of diabetes who returns to office today complaining of long, painful nails  while ambulating in shoes; unable to trim. Patient states that the glucose reading was 140 yesterday and last A1c of 9.  Reports saw PCP Dr. Venetia Maxon, today. Patient denies any other new changes in medication or other new problems since last visit except having some pain in arch that got better after changing shoes and big toe joint pain that was relieved after taking his gout medication.  No other issues noted.  Patient Active Problem List   Diagnosis Date Noted  . Diabetes mellitus 09/04/2010   Current Outpatient Medications on File Prior to Visit  Medication Sig Dispense Refill  . ALLOPURINOL PO Take by mouth.      . FOLIC ACID PO Take by mouth.      . Levothyroxine Sodium (SYNTHROID PO) Take by mouth.      . metFORMIN (GLUCOPHAGE) 500 MG tablet TAKE 1 TABLET BY MOUTH TWICE DAILY WITH MEALS FOR 30 DAYS    . metFORMIN (GLUCOPHAGE-XR) 500 MG 24 hr tablet TAKE TWO TABLETS BY MOUTH ONCE DAILY WITH EVENING MEAL  1  . NOVOLIN N RELION 100 UNIT/ML injection INJECT 10 UNITS SUBCUTANEOUSLY ONCE DAILY AS DIRECTED AT BEDTIME FOR 30 DAYS    . OMEPRAZOLE PO Take by mouth.      . Pioglitazone HCl (ACTOS PO) Take by mouth.      . pramipexole (MIRAPEX) 1 MG tablet TAKE 1 TABLET BY MOUTH AT BEDTIME . APPOINTMENT REQUIRED FOR FUTURE REFILLS    . Rosuvastatin Calcium (CRESTOR PO) Take by mouth.      . SERTRALINE HCL PO Take by mouth.      . SPIRONOLACTONE PO Take by mouth.      . TERAZOSIN HCL PO Take by mouth.      . TRUEPLUS LANCETS 33G MISC   5  . TRUETRACK TEST test strip   4  . WARFARIN SODIUM PO Take by mouth.      . Zinc Sulfate (ZINC 15 PO) Take by mouth.       No current facility-administered medications on file prior to visit.    No Known Allergies  No results found for this or any  previous visit (from the past 2160 hour(s)).  Objective: General: Patient is awake, alert, and oriented x 3 and in no acute distress.  Integument: Skin is warm, dry and supple bilateral. Nails are tender, long, thickened and dystrophic with subungual debris, consistent with onychomycosis, 1-5 bilateral.  No open lesions or preulcerative lesions present bilateral. Remaining integument unremarkable.  Vasculature:  Dorsalis Pedis pulse 1/4 bilateral. Posterior Tibial pulse  0/4 bilateral. Capillary fill time <3 sec 1-5 bilateral. Diminished hair growth to the level of the digits.Temperature gradient within normal limits. No varicosities present bilateral.  Trace chronic edema present bilateral.   Neurology: The patient has intact sensation measured with a 5.07/10g Semmes Weinstein Monofilament at all pedal sites bilateral. Vibratory sensation diminished bilateral with tuning fork. No Babinski sign present bilateral.   Musculoskeletal: Asymptomatic hammertoe pedal deformities noted bilateral. No reproducible pain at arch or 1st MTPJ. Muscular strength 5/5 in all lower extremity muscular groups bilateral without pain on range of motion. No tenderness with calf compression bilateral.  Assessment and Plan: Problem List Items Addressed This Visit    None    Visit Diagnoses  Pain due to onychomycosis of toenail    -  Primary   Type 2 diabetes, controlled, with neuropathy ([facility])       Relevant Medications   metFORMIN (GLUCOPHAGE) 500 MG tablet   PVD (peripheral vascular disease) ([place])         -Examined patient. -Discussed and educated patient on diabetic foot care, especially with  regards to the vascular, neurological and musculoskeletal systems. -Mechanically debrided all nails 1-5 bilateral using sterile nail nipper and filed with dremel without incident  -Continue with good supportive/diabetic shoes and avoid shoes that aggrrvate the arch -Continue with Gout meds  -Patient to return in 3  months for at risk foot/diabetic nail care -Patient advised to call the office if any problems or questions arise in the meantime.  Landis Martins, DPM

## 2018-10-11 DIAGNOSIS — E538 Deficiency of other specified B group vitamins: Secondary | ICD-10-CM | POA: Diagnosis not present

## 2018-10-11 DIAGNOSIS — E291 Testicular hypofunction: Secondary | ICD-10-CM | POA: Diagnosis not present

## 2018-10-25 DIAGNOSIS — E236 Other disorders of pituitary gland: Secondary | ICD-10-CM | POA: Diagnosis not present

## 2018-10-25 DIAGNOSIS — E538 Deficiency of other specified B group vitamins: Secondary | ICD-10-CM | POA: Diagnosis not present

## 2018-10-25 DIAGNOSIS — Z23 Encounter for immunization: Secondary | ICD-10-CM | POA: Diagnosis not present

## 2018-10-26 DIAGNOSIS — I10 Essential (primary) hypertension: Secondary | ICD-10-CM | POA: Diagnosis not present

## 2018-10-26 DIAGNOSIS — E785 Hyperlipidemia, unspecified: Secondary | ICD-10-CM | POA: Diagnosis not present

## 2018-11-02 DIAGNOSIS — I4892 Unspecified atrial flutter: Secondary | ICD-10-CM | POA: Diagnosis not present

## 2018-11-02 DIAGNOSIS — I4819 Other persistent atrial fibrillation: Secondary | ICD-10-CM | POA: Diagnosis not present

## 2018-11-08 DIAGNOSIS — E538 Deficiency of other specified B group vitamins: Secondary | ICD-10-CM | POA: Diagnosis not present

## 2018-11-08 DIAGNOSIS — E291 Testicular hypofunction: Secondary | ICD-10-CM | POA: Diagnosis not present

## 2018-11-22 DIAGNOSIS — E538 Deficiency of other specified B group vitamins: Secondary | ICD-10-CM | POA: Diagnosis not present

## 2018-11-22 DIAGNOSIS — E236 Other disorders of pituitary gland: Secondary | ICD-10-CM | POA: Diagnosis not present

## 2018-11-25 DIAGNOSIS — I10 Essential (primary) hypertension: Secondary | ICD-10-CM | POA: Diagnosis not present

## 2018-11-25 DIAGNOSIS — E785 Hyperlipidemia, unspecified: Secondary | ICD-10-CM | POA: Diagnosis not present

## 2018-11-25 DIAGNOSIS — E1151 Type 2 diabetes mellitus with diabetic peripheral angiopathy without gangrene: Secondary | ICD-10-CM | POA: Diagnosis not present

## 2018-12-05 DIAGNOSIS — E538 Deficiency of other specified B group vitamins: Secondary | ICD-10-CM | POA: Diagnosis not present

## 2018-12-05 DIAGNOSIS — E1142 Type 2 diabetes mellitus with diabetic polyneuropathy: Secondary | ICD-10-CM | POA: Diagnosis not present

## 2018-12-05 DIAGNOSIS — E291 Testicular hypofunction: Secondary | ICD-10-CM | POA: Diagnosis not present

## 2018-12-05 DIAGNOSIS — E039 Hypothyroidism, unspecified: Secondary | ICD-10-CM | POA: Diagnosis not present

## 2018-12-05 DIAGNOSIS — E559 Vitamin D deficiency, unspecified: Secondary | ICD-10-CM | POA: Diagnosis not present

## 2018-12-20 DIAGNOSIS — E538 Deficiency of other specified B group vitamins: Secondary | ICD-10-CM | POA: Diagnosis not present

## 2018-12-20 DIAGNOSIS — E291 Testicular hypofunction: Secondary | ICD-10-CM | POA: Diagnosis not present

## 2019-01-03 DIAGNOSIS — E291 Testicular hypofunction: Secondary | ICD-10-CM | POA: Diagnosis not present

## 2019-01-03 DIAGNOSIS — E538 Deficiency of other specified B group vitamins: Secondary | ICD-10-CM | POA: Diagnosis not present

## 2019-01-04 ENCOUNTER — Ambulatory Visit: Payer: Medicare Other | Admitting: Sports Medicine

## 2019-01-11 ENCOUNTER — Other Ambulatory Visit: Payer: Self-pay

## 2019-01-11 ENCOUNTER — Encounter: Payer: Self-pay | Admitting: Sports Medicine

## 2019-01-11 ENCOUNTER — Ambulatory Visit (INDEPENDENT_AMBULATORY_CARE_PROVIDER_SITE_OTHER): Payer: Medicare Other | Admitting: Sports Medicine

## 2019-01-11 DIAGNOSIS — M79676 Pain in unspecified toe(s): Secondary | ICD-10-CM

## 2019-01-11 DIAGNOSIS — I739 Peripheral vascular disease, unspecified: Secondary | ICD-10-CM

## 2019-01-11 DIAGNOSIS — E1140 Type 2 diabetes mellitus with diabetic neuropathy, unspecified: Secondary | ICD-10-CM | POA: Diagnosis not present

## 2019-01-11 DIAGNOSIS — B351 Tinea unguium: Secondary | ICD-10-CM

## 2019-01-11 NOTE — Progress Notes (Signed)
Patient ID: Alan Beltran, male   DOB: Jun 03, 1934, 84 y.o.   MRN: [id number] Subjective: Alan Beltran is a 84 y.o. male patient with history of diabetes who returns to office today complaining of long, painful nails  while ambulating in shoes; unable to trim. Patient states that the glucose reading was 137 yesterday and last A1c of 9.2.  Reports saw PCP Dr. Venetia Maxon, 2 months.  No other issues noted.  Patient Active Problem List   Diagnosis Date Noted  . Diabetes mellitus 09/04/2010   Current Outpatient Medications on File Prior to Visit  Medication Sig Dispense Refill  . ALLOPURINOL PO Take by mouth.      . FOLIC ACID PO Take by mouth.      . Levothyroxine Sodium (SYNTHROID PO) Take by mouth.      . metFORMIN (GLUCOPHAGE) 500 MG tablet TAKE 1 TABLET BY MOUTH TWICE DAILY WITH MEALS FOR 30 DAYS    . metFORMIN (GLUCOPHAGE-XR) 500 MG 24 hr tablet TAKE TWO TABLETS BY MOUTH ONCE DAILY WITH EVENING MEAL  1  . NOVOLIN N RELION 100 UNIT/ML injection INJECT 10 UNITS SUBCUTANEOUSLY ONCE DAILY AS DIRECTED AT BEDTIME FOR 30 DAYS    . OMEPRAZOLE PO Take by mouth.      . Pioglitazone HCl (ACTOS PO) Take by mouth.      . pramipexole (MIRAPEX) 1 MG tablet TAKE 1 TABLET BY MOUTH AT BEDTIME . APPOINTMENT REQUIRED FOR FUTURE REFILLS    . Rosuvastatin Calcium (CRESTOR PO) Take by mouth.      . SERTRALINE HCL PO Take by mouth.      . SPIRONOLACTONE PO Take by mouth.      . TERAZOSIN HCL PO Take by mouth.      . TRUEPLUS LANCETS 33G MISC   5  . TRUETRACK TEST test strip   4  . WARFARIN SODIUM PO Take by mouth.      . Zinc Sulfate (ZINC 15 PO) Take by mouth.       No current facility-administered medications on file prior to visit.   No Known Allergies  No results found for this or any previous visit (from the past 2160 hour(s)).  Objective: General: Patient is awake, alert, and oriented x 3 and in no acute distress.  Integument: Skin is warm, dry and supple bilateral. Nails are tender, long, thickened and  dystrophic with subungual debris, consistent with onychomycosis, 1-5 bilateral.  No open lesions or preulcerative lesions present bilateral. Remaining integument unremarkable.  Vasculature:  Dorsalis Pedis pulse 1/4 bilateral. Posterior Tibial pulse  0/4 bilateral. Capillary fill time <3 sec 1-5 bilateral. Diminished hair growth to the level of the digits.Temperature gradient within normal limits. No varicosities present bilateral.  Trace chronic edema present bilateral.   Neurology: The patient has intact sensation measured with a 5.07/10g Semmes Weinstein Monofilament at all pedal sites bilateral. Vibratory sensation diminished bilateral with tuning fork. No Babinski sign present bilateral.   Musculoskeletal: Asymptomatic hammertoe pedal deformities noted bilateral. Muscular strength 5/5 in all lower extremity muscular groups bilateral without pain on range of motion. No tenderness with calf compression bilateral.  Assessment and Plan: Problem List Items Addressed This Visit    None    Visit Diagnoses    Pain due to onychomycosis of toenail    -  Primary   Type 2 diabetes, controlled, with neuropathy ([facility])       PVD (peripheral vascular disease) ([place])         -Examined patient. -Re-discussed and educated  patient on diabetic foot care, especially with  regards to the vascular, neurological and musculoskeletal systems. -Mechanically debrided all nails 1-5 bilateral using sterile nail nipper and filed with dremel without incident  -Patient to return in 3 months for at risk foot/diabetic nail care -Patient advised to call the office if any problems or questions arise in the meantime.  Landis Martins, DPM

## 2019-02-07 DIAGNOSIS — E785 Hyperlipidemia, unspecified: Secondary | ICD-10-CM | POA: Diagnosis not present

## 2019-02-07 DIAGNOSIS — I10 Essential (primary) hypertension: Secondary | ICD-10-CM | POA: Diagnosis not present

## 2019-02-07 DIAGNOSIS — I4819 Other persistent atrial fibrillation: Secondary | ICD-10-CM | POA: Diagnosis not present

## 2019-02-07 DIAGNOSIS — I5032 Chronic diastolic (congestive) heart failure: Secondary | ICD-10-CM | POA: Diagnosis not present

## 2019-02-07 DIAGNOSIS — I2510 Atherosclerotic heart disease of native coronary artery without angina pectoris: Secondary | ICD-10-CM | POA: Diagnosis not present

## 2019-02-14 DIAGNOSIS — E291 Testicular hypofunction: Secondary | ICD-10-CM | POA: Diagnosis not present

## 2019-02-14 DIAGNOSIS — E538 Deficiency of other specified B group vitamins: Secondary | ICD-10-CM | POA: Diagnosis not present

## 2019-02-21 DIAGNOSIS — Z79899 Other long term (current) drug therapy: Secondary | ICD-10-CM | POA: Diagnosis not present

## 2019-02-21 DIAGNOSIS — T466X5A Adverse effect of antihyperlipidemic and antiarteriosclerotic drugs, initial encounter: Secondary | ICD-10-CM | POA: Diagnosis not present

## 2019-02-21 DIAGNOSIS — E785 Hyperlipidemia, unspecified: Secondary | ICD-10-CM | POA: Diagnosis not present

## 2019-02-21 DIAGNOSIS — D6869 Other thrombophilia: Secondary | ICD-10-CM | POA: Diagnosis not present

## 2019-02-21 DIAGNOSIS — Z0000 Encounter for general adult medical examination without abnormal findings: Secondary | ICD-10-CM | POA: Diagnosis not present

## 2019-02-21 DIAGNOSIS — E1159 Type 2 diabetes mellitus with other circulatory complications: Secondary | ICD-10-CM | POA: Diagnosis not present

## 2019-02-21 DIAGNOSIS — M791 Myalgia, unspecified site: Secondary | ICD-10-CM | POA: Diagnosis not present

## 2019-02-25 DIAGNOSIS — I10 Essential (primary) hypertension: Secondary | ICD-10-CM | POA: Diagnosis not present

## 2019-02-25 DIAGNOSIS — E1159 Type 2 diabetes mellitus with other circulatory complications: Secondary | ICD-10-CM | POA: Diagnosis not present

## 2019-02-25 DIAGNOSIS — E785 Hyperlipidemia, unspecified: Secondary | ICD-10-CM | POA: Diagnosis not present

## 2019-03-01 DIAGNOSIS — E538 Deficiency of other specified B group vitamins: Secondary | ICD-10-CM | POA: Diagnosis not present

## 2019-03-01 DIAGNOSIS — E291 Testicular hypofunction: Secondary | ICD-10-CM | POA: Diagnosis not present

## 2019-03-07 DIAGNOSIS — D692 Other nonthrombocytopenic purpura: Secondary | ICD-10-CM | POA: Diagnosis not present

## 2019-03-07 DIAGNOSIS — L820 Inflamed seborrheic keratosis: Secondary | ICD-10-CM | POA: Diagnosis not present

## 2019-03-07 DIAGNOSIS — R21 Rash and other nonspecific skin eruption: Secondary | ICD-10-CM | POA: Diagnosis not present

## 2019-03-07 DIAGNOSIS — L218 Other seborrheic dermatitis: Secondary | ICD-10-CM | POA: Diagnosis not present

## 2019-03-07 DIAGNOSIS — L578 Other skin changes due to chronic exposure to nonionizing radiation: Secondary | ICD-10-CM | POA: Diagnosis not present

## 2019-03-07 DIAGNOSIS — L821 Other seborrheic keratosis: Secondary | ICD-10-CM | POA: Diagnosis not present

## 2019-03-07 DIAGNOSIS — L570 Actinic keratosis: Secondary | ICD-10-CM | POA: Diagnosis not present

## 2019-03-14 DIAGNOSIS — R8290 Unspecified abnormal findings in urine: Secondary | ICD-10-CM | POA: Diagnosis not present

## 2019-03-14 DIAGNOSIS — Z794 Long term (current) use of insulin: Secondary | ICD-10-CM | POA: Diagnosis not present

## 2019-03-14 DIAGNOSIS — E291 Testicular hypofunction: Secondary | ICD-10-CM | POA: Diagnosis not present

## 2019-03-14 DIAGNOSIS — E559 Vitamin D deficiency, unspecified: Secondary | ICD-10-CM | POA: Diagnosis not present

## 2019-03-14 DIAGNOSIS — E538 Deficiency of other specified B group vitamins: Secondary | ICD-10-CM | POA: Diagnosis not present

## 2019-03-14 DIAGNOSIS — E039 Hypothyroidism, unspecified: Secondary | ICD-10-CM | POA: Diagnosis not present

## 2019-03-14 DIAGNOSIS — Z125 Encounter for screening for malignant neoplasm of prostate: Secondary | ICD-10-CM | POA: Diagnosis not present

## 2019-03-14 DIAGNOSIS — E1142 Type 2 diabetes mellitus with diabetic polyneuropathy: Secondary | ICD-10-CM | POA: Diagnosis not present

## 2019-03-14 DIAGNOSIS — R61 Generalized hyperhidrosis: Secondary | ICD-10-CM | POA: Diagnosis not present

## 2019-03-26 DIAGNOSIS — K219 Gastro-esophageal reflux disease without esophagitis: Secondary | ICD-10-CM | POA: Diagnosis not present

## 2019-03-26 DIAGNOSIS — N400 Enlarged prostate without lower urinary tract symptoms: Secondary | ICD-10-CM | POA: Diagnosis not present

## 2019-03-26 DIAGNOSIS — M109 Gout, unspecified: Secondary | ICD-10-CM | POA: Diagnosis not present

## 2019-03-28 DIAGNOSIS — E291 Testicular hypofunction: Secondary | ICD-10-CM | POA: Diagnosis not present

## 2019-03-28 DIAGNOSIS — E538 Deficiency of other specified B group vitamins: Secondary | ICD-10-CM | POA: Diagnosis not present

## 2019-04-11 DIAGNOSIS — E538 Deficiency of other specified B group vitamins: Secondary | ICD-10-CM | POA: Diagnosis not present

## 2019-04-11 DIAGNOSIS — E291 Testicular hypofunction: Secondary | ICD-10-CM | POA: Diagnosis not present

## 2019-04-12 ENCOUNTER — Other Ambulatory Visit: Payer: Self-pay

## 2019-04-12 ENCOUNTER — Encounter: Payer: Self-pay | Admitting: Sports Medicine

## 2019-04-12 ENCOUNTER — Ambulatory Visit (INDEPENDENT_AMBULATORY_CARE_PROVIDER_SITE_OTHER): Payer: Medicare Other | Admitting: Sports Medicine

## 2019-04-12 DIAGNOSIS — B351 Tinea unguium: Secondary | ICD-10-CM | POA: Diagnosis not present

## 2019-04-12 DIAGNOSIS — T148XXA Other injury of unspecified body region, initial encounter: Secondary | ICD-10-CM

## 2019-04-12 DIAGNOSIS — I739 Peripheral vascular disease, unspecified: Secondary | ICD-10-CM

## 2019-04-12 DIAGNOSIS — M79676 Pain in unspecified toe(s): Secondary | ICD-10-CM | POA: Diagnosis not present

## 2019-04-12 DIAGNOSIS — E1140 Type 2 diabetes mellitus with diabetic neuropathy, unspecified: Secondary | ICD-10-CM | POA: Diagnosis not present

## 2019-04-12 NOTE — Progress Notes (Signed)
Patient ID: Alan Beltran, male   DOB: May 18, 1934, 85 y.o.   MRN: [id number] Subjective: Alan Beltran is a 85 y.o. male patient with history of diabetes who returns to office today complaining of long, painful nails  while ambulating in shoes; unable to trim. Patient states that the glucose reading was 100 and last A1c of 9.  Reports saw PCP Dr. Venetia Maxon, 1 month ago.  Patient also admits that he states bumped his toe on the tip of the chair where the rubber meets the chair at the foot post area where he bumped it about 3 different times about 10 weeks ago reports that it has been drying up and has not drained or caused him any problem so he did not try to come in sooner.  Patient denies any constitutional symptoms denies nausea vomiting fever chills or any other pain or symptoms at this time.  No other issues noted.  Patient Active Problem List   Diagnosis Date Noted  . Diabetes mellitus 09/04/2010   Current Outpatient Medications on File Prior to Visit  Medication Sig Dispense Refill  . ALLOPURINOL PO Take by mouth.      . FOLIC ACID PO Take by mouth.      . Levothyroxine Sodium (SYNTHROID PO) Take by mouth.      . metFORMIN (GLUCOPHAGE) 500 MG tablet TAKE 1 TABLET BY MOUTH TWICE DAILY WITH MEALS FOR 30 DAYS    . metFORMIN (GLUCOPHAGE-XR) 500 MG 24 hr tablet TAKE TWO TABLETS BY MOUTH ONCE DAILY WITH EVENING MEAL  1  . NOVOLIN N RELION 100 UNIT/ML injection INJECT 10 UNITS SUBCUTANEOUSLY ONCE DAILY AS DIRECTED AT BEDTIME FOR 30 DAYS    . OMEPRAZOLE PO Take by mouth.      . Pioglitazone HCl (ACTOS PO) Take by mouth.      . pramipexole (MIRAPEX) 1 MG tablet TAKE 1 TABLET BY MOUTH AT BEDTIME . APPOINTMENT REQUIRED FOR FUTURE REFILLS    . Rosuvastatin Calcium (CRESTOR PO) Take by mouth.      . SERTRALINE HCL PO Take by mouth.      . SPIRONOLACTONE PO Take by mouth.      . TERAZOSIN HCL PO Take by mouth.      . TRUEPLUS LANCETS 33G MISC   5  . TRUETRACK TEST test strip   4  . WARFARIN SODIUM PO Take by  mouth.      . Zinc Sulfate (ZINC 15 PO) Take by mouth.       No current facility-administered medications on file prior to visit.   No Known Allergies  No results found for this or any previous visit (from the past 2160 hour(s)).  Objective: General: Patient is awake, alert, and oriented x 3 and in no acute distress.  Integument: Skin is warm, dry and supple bilateral. Nails are tender, long, thickened and dystrophic with subungual debris, consistent with onychomycosis, 1-5 bilateral.  No open lesions or preulcerative lesions present bilateral, dried blood blister noted to the left first toe with no surrounding signs of infection. Remaining integument unremarkable.  Vasculature:  Dorsalis Pedis pulse 1/4 bilateral. Posterior Tibial pulse  0/4 bilateral. Capillary fill time <3 sec 1-5 bilateral. Diminished hair growth to the level of the digits.Temperature gradient within normal limits. No varicosities present bilateral.  Trace chronic edema present bilateral.   Neurology: The patient has intact sensation measured with a 5.07/10g Semmes Weinstein Monofilament at all pedal sites bilateral. Vibratory sensation diminished bilateral with tuning fork. No Babinski sign present  bilateral.   Musculoskeletal: No pain to palpation at left first toe.  Asymptomatic hammertoe pedal deformities noted bilateral. Muscular strength 5/5 in all lower extremity muscular groups bilateral without pain on range of motion. No tenderness with calf compression bilateral.  Assessment and Plan: Problem List Items Addressed This Visit    None    Visit Diagnoses    Pain due to onychomycosis of toenail    -  Primary   Type 2 diabetes, controlled, with neuropathy (HCC)       PVD (peripheral vascular disease) ([place])       Blood blister         -Examined patient. -Continue with daily inspection of feet in the setting of diabetes -Mechanically debrided all nails 1-5 bilateral using sterile nail nipper and filed with  dremel without incident  -Continue with monitoring blood blister at left great toe -Patient to return in 3 months for at risk foot/diabetic nail care -Patient advised to call the office if any problems or questions arise in the meantime.  Landis Martins, DPM

## 2019-04-20 DIAGNOSIS — E1159 Type 2 diabetes mellitus with other circulatory complications: Secondary | ICD-10-CM | POA: Diagnosis not present

## 2019-04-20 DIAGNOSIS — I4891 Unspecified atrial fibrillation: Secondary | ICD-10-CM | POA: Diagnosis not present

## 2019-04-20 DIAGNOSIS — D6869 Other thrombophilia: Secondary | ICD-10-CM | POA: Diagnosis not present

## 2019-04-20 DIAGNOSIS — R252 Cramp and spasm: Secondary | ICD-10-CM | POA: Diagnosis not present

## 2019-04-25 DIAGNOSIS — E538 Deficiency of other specified B group vitamins: Secondary | ICD-10-CM | POA: Diagnosis not present

## 2019-04-25 DIAGNOSIS — E291 Testicular hypofunction: Secondary | ICD-10-CM | POA: Diagnosis not present

## 2019-04-26 DIAGNOSIS — M109 Gout, unspecified: Secondary | ICD-10-CM | POA: Diagnosis not present

## 2019-04-26 DIAGNOSIS — N400 Enlarged prostate without lower urinary tract symptoms: Secondary | ICD-10-CM | POA: Diagnosis not present

## 2019-04-26 DIAGNOSIS — K219 Gastro-esophageal reflux disease without esophagitis: Secondary | ICD-10-CM | POA: Diagnosis not present

## 2019-05-05 DIAGNOSIS — Z03818 Encounter for observation for suspected exposure to other biological agents ruled out: Secondary | ICD-10-CM | POA: Diagnosis not present

## 2019-05-05 DIAGNOSIS — E119 Type 2 diabetes mellitus without complications: Secondary | ICD-10-CM | POA: Insufficient documentation

## 2019-05-05 DIAGNOSIS — S0990XA Unspecified injury of head, initial encounter: Secondary | ICD-10-CM | POA: Diagnosis not present

## 2019-05-05 DIAGNOSIS — R4182 Altered mental status, unspecified: Secondary | ICD-10-CM | POA: Diagnosis not present

## 2019-05-05 DIAGNOSIS — Z794 Long term (current) use of insulin: Secondary | ICD-10-CM | POA: Diagnosis not present

## 2019-05-05 DIAGNOSIS — I2510 Atherosclerotic heart disease of native coronary artery without angina pectoris: Secondary | ICD-10-CM | POA: Insufficient documentation

## 2019-05-05 DIAGNOSIS — I169 Hypertensive crisis, unspecified: Secondary | ICD-10-CM | POA: Diagnosis not present

## 2019-05-05 DIAGNOSIS — W19XXXA Unspecified fall, initial encounter: Secondary | ICD-10-CM | POA: Diagnosis not present

## 2019-05-05 DIAGNOSIS — E039 Hypothyroidism, unspecified: Secondary | ICD-10-CM | POA: Insufficient documentation

## 2019-05-05 DIAGNOSIS — J439 Emphysema, unspecified: Secondary | ICD-10-CM | POA: Diagnosis not present

## 2019-05-05 DIAGNOSIS — G9340 Encephalopathy, unspecified: Secondary | ICD-10-CM | POA: Diagnosis not present

## 2019-05-05 DIAGNOSIS — I5032 Chronic diastolic (congestive) heart failure: Secondary | ICD-10-CM | POA: Diagnosis not present

## 2019-05-05 DIAGNOSIS — N401 Enlarged prostate with lower urinary tract symptoms: Secondary | ICD-10-CM | POA: Diagnosis not present

## 2019-05-05 DIAGNOSIS — N400 Enlarged prostate without lower urinary tract symptoms: Secondary | ICD-10-CM | POA: Diagnosis not present

## 2019-05-05 DIAGNOSIS — R58 Hemorrhage, not elsewhere classified: Secondary | ICD-10-CM | POA: Diagnosis not present

## 2019-05-05 DIAGNOSIS — R52 Pain, unspecified: Secondary | ICD-10-CM | POA: Diagnosis not present

## 2019-05-05 DIAGNOSIS — R569 Unspecified convulsions: Secondary | ICD-10-CM | POA: Diagnosis not present

## 2019-05-05 DIAGNOSIS — E871 Hypo-osmolality and hyponatremia: Secondary | ICD-10-CM | POA: Diagnosis not present

## 2019-05-05 DIAGNOSIS — R930 Abnormal findings on diagnostic imaging of skull and head, not elsewhere classified: Secondary | ICD-10-CM | POA: Diagnosis not present

## 2019-05-05 DIAGNOSIS — R531 Weakness: Secondary | ICD-10-CM | POA: Diagnosis not present

## 2019-05-06 DIAGNOSIS — E119 Type 2 diabetes mellitus without complications: Secondary | ICD-10-CM | POA: Diagnosis not present

## 2019-05-06 DIAGNOSIS — E785 Hyperlipidemia, unspecified: Secondary | ICD-10-CM | POA: Diagnosis present

## 2019-05-06 DIAGNOSIS — I480 Paroxysmal atrial fibrillation: Secondary | ICD-10-CM | POA: Diagnosis not present

## 2019-05-06 DIAGNOSIS — Z8673 Personal history of transient ischemic attack (TIA), and cerebral infarction without residual deficits: Secondary | ICD-10-CM | POA: Diagnosis not present

## 2019-05-06 DIAGNOSIS — R2689 Other abnormalities of gait and mobility: Secondary | ICD-10-CM | POA: Diagnosis present

## 2019-05-06 DIAGNOSIS — R569 Unspecified convulsions: Secondary | ICD-10-CM | POA: Diagnosis present

## 2019-05-06 DIAGNOSIS — R4182 Altered mental status, unspecified: Secondary | ICD-10-CM | POA: Diagnosis not present

## 2019-05-06 DIAGNOSIS — D329 Benign neoplasm of meninges, unspecified: Secondary | ICD-10-CM | POA: Diagnosis present

## 2019-05-06 DIAGNOSIS — G9340 Encephalopathy, unspecified: Secondary | ICD-10-CM | POA: Diagnosis present

## 2019-05-06 DIAGNOSIS — G319 Degenerative disease of nervous system, unspecified: Secondary | ICD-10-CM | POA: Diagnosis not present

## 2019-05-06 DIAGNOSIS — E039 Hypothyroidism, unspecified: Secondary | ICD-10-CM | POA: Diagnosis present

## 2019-05-06 DIAGNOSIS — R296 Repeated falls: Secondary | ICD-10-CM | POA: Diagnosis present

## 2019-05-06 DIAGNOSIS — I110 Hypertensive heart disease with heart failure: Secondary | ICD-10-CM | POA: Diagnosis present

## 2019-05-06 DIAGNOSIS — W19XXXA Unspecified fall, initial encounter: Secondary | ICD-10-CM | POA: Diagnosis not present

## 2019-05-06 DIAGNOSIS — I5032 Chronic diastolic (congestive) heart failure: Secondary | ICD-10-CM | POA: Diagnosis present

## 2019-05-06 DIAGNOSIS — E1140 Type 2 diabetes mellitus with diabetic neuropathy, unspecified: Secondary | ICD-10-CM | POA: Diagnosis present

## 2019-05-06 DIAGNOSIS — Z8679 Personal history of other diseases of the circulatory system: Secondary | ICD-10-CM | POA: Diagnosis not present

## 2019-05-06 DIAGNOSIS — R9431 Abnormal electrocardiogram [ECG] [EKG]: Secondary | ICD-10-CM | POA: Diagnosis not present

## 2019-05-06 DIAGNOSIS — J439 Emphysema, unspecified: Secondary | ICD-10-CM | POA: Diagnosis present

## 2019-05-06 DIAGNOSIS — I619 Nontraumatic intracerebral hemorrhage, unspecified: Secondary | ICD-10-CM | POA: Diagnosis not present

## 2019-05-06 DIAGNOSIS — M5114 Intervertebral disc disorders with radiculopathy, thoracic region: Secondary | ICD-10-CM | POA: Diagnosis not present

## 2019-05-06 DIAGNOSIS — N401 Enlarged prostate with lower urinary tract symptoms: Secondary | ICD-10-CM | POA: Diagnosis present

## 2019-05-06 DIAGNOSIS — N281 Cyst of kidney, acquired: Secondary | ICD-10-CM | POA: Diagnosis not present

## 2019-05-06 DIAGNOSIS — I639 Cerebral infarction, unspecified: Secondary | ICD-10-CM | POA: Diagnosis not present

## 2019-05-06 DIAGNOSIS — K219 Gastro-esophageal reflux disease without esophagitis: Secondary | ICD-10-CM | POA: Diagnosis present

## 2019-05-06 DIAGNOSIS — M109 Gout, unspecified: Secondary | ICD-10-CM | POA: Diagnosis present

## 2019-05-06 DIAGNOSIS — M4804 Spinal stenosis, thoracic region: Secondary | ICD-10-CM | POA: Diagnosis not present

## 2019-05-06 DIAGNOSIS — E871 Hypo-osmolality and hyponatremia: Secondary | ICD-10-CM | POA: Diagnosis present

## 2019-05-06 DIAGNOSIS — Z794 Long term (current) use of insulin: Secondary | ICD-10-CM | POA: Diagnosis not present

## 2019-05-06 DIAGNOSIS — Z20822 Contact with and (suspected) exposure to covid-19: Secondary | ICD-10-CM | POA: Diagnosis present

## 2019-05-06 DIAGNOSIS — Z951 Presence of aortocoronary bypass graft: Secondary | ICD-10-CM | POA: Diagnosis not present

## 2019-05-06 DIAGNOSIS — H7093 Unspecified mastoiditis, bilateral: Secondary | ICD-10-CM | POA: Diagnosis not present

## 2019-05-06 DIAGNOSIS — I2510 Atherosclerotic heart disease of native coronary artery without angina pectoris: Secondary | ICD-10-CM | POA: Diagnosis not present

## 2019-05-06 DIAGNOSIS — M4724 Other spondylosis with radiculopathy, thoracic region: Secondary | ICD-10-CM | POA: Diagnosis not present

## 2019-05-06 DIAGNOSIS — E1151 Type 2 diabetes mellitus with diabetic peripheral angiopathy without gangrene: Secondary | ICD-10-CM | POA: Diagnosis present

## 2019-05-07 DIAGNOSIS — G40909 Epilepsy, unspecified, not intractable, without status epilepticus: Secondary | ICD-10-CM | POA: Insufficient documentation

## 2019-05-08 DIAGNOSIS — D329 Benign neoplasm of meninges, unspecified: Secondary | ICD-10-CM | POA: Insufficient documentation

## 2019-05-08 DIAGNOSIS — R2689 Other abnormalities of gait and mobility: Secondary | ICD-10-CM | POA: Insufficient documentation

## 2019-05-09 DIAGNOSIS — N400 Enlarged prostate without lower urinary tract symptoms: Secondary | ICD-10-CM | POA: Diagnosis not present

## 2019-05-09 DIAGNOSIS — Z794 Long term (current) use of insulin: Secondary | ICD-10-CM | POA: Diagnosis not present

## 2019-05-09 DIAGNOSIS — I739 Peripheral vascular disease, unspecified: Secondary | ICD-10-CM | POA: Diagnosis not present

## 2019-05-09 DIAGNOSIS — Z951 Presence of aortocoronary bypass graft: Secondary | ICD-10-CM | POA: Diagnosis not present

## 2019-05-09 DIAGNOSIS — R296 Repeated falls: Secondary | ICD-10-CM | POA: Diagnosis not present

## 2019-05-09 DIAGNOSIS — J449 Chronic obstructive pulmonary disease, unspecified: Secondary | ICD-10-CM | POA: Diagnosis not present

## 2019-05-09 DIAGNOSIS — G40909 Epilepsy, unspecified, not intractable, without status epilepticus: Secondary | ICD-10-CM | POA: Diagnosis not present

## 2019-05-09 DIAGNOSIS — I5032 Chronic diastolic (congestive) heart failure: Secondary | ICD-10-CM | POA: Diagnosis not present

## 2019-05-09 DIAGNOSIS — I2510 Atherosclerotic heart disease of native coronary artery without angina pectoris: Secondary | ICD-10-CM | POA: Diagnosis not present

## 2019-05-09 DIAGNOSIS — I480 Paroxysmal atrial fibrillation: Secondary | ICD-10-CM | POA: Insufficient documentation

## 2019-05-09 DIAGNOSIS — E119 Type 2 diabetes mellitus without complications: Secondary | ICD-10-CM | POA: Diagnosis not present

## 2019-05-09 DIAGNOSIS — I69154 Hemiplegia and hemiparesis following nontraumatic intracerebral hemorrhage affecting left non-dominant side: Secondary | ICD-10-CM | POA: Diagnosis not present

## 2019-05-09 DIAGNOSIS — I110 Hypertensive heart disease with heart failure: Secondary | ICD-10-CM | POA: Diagnosis not present

## 2019-05-09 DIAGNOSIS — D329 Benign neoplasm of meninges, unspecified: Secondary | ICD-10-CM | POA: Diagnosis not present

## 2019-05-10 DIAGNOSIS — R296 Repeated falls: Secondary | ICD-10-CM | POA: Diagnosis not present

## 2019-05-10 DIAGNOSIS — I69154 Hemiplegia and hemiparesis following nontraumatic intracerebral hemorrhage affecting left non-dominant side: Secondary | ICD-10-CM | POA: Diagnosis not present

## 2019-05-10 DIAGNOSIS — G40909 Epilepsy, unspecified, not intractable, without status epilepticus: Secondary | ICD-10-CM | POA: Diagnosis not present

## 2019-05-10 DIAGNOSIS — I110 Hypertensive heart disease with heart failure: Secondary | ICD-10-CM | POA: Diagnosis not present

## 2019-05-10 DIAGNOSIS — I5032 Chronic diastolic (congestive) heart failure: Secondary | ICD-10-CM | POA: Diagnosis not present

## 2019-05-10 DIAGNOSIS — D329 Benign neoplasm of meninges, unspecified: Secondary | ICD-10-CM | POA: Diagnosis not present

## 2019-05-12 DIAGNOSIS — G40909 Epilepsy, unspecified, not intractable, without status epilepticus: Secondary | ICD-10-CM | POA: Diagnosis not present

## 2019-05-12 DIAGNOSIS — I259 Chronic ischemic heart disease, unspecified: Secondary | ICD-10-CM | POA: Diagnosis not present

## 2019-05-12 DIAGNOSIS — E039 Hypothyroidism, unspecified: Secondary | ICD-10-CM | POA: Diagnosis not present

## 2019-05-12 DIAGNOSIS — D320 Benign neoplasm of cerebral meninges: Secondary | ICD-10-CM | POA: Diagnosis not present

## 2019-05-12 DIAGNOSIS — E538 Deficiency of other specified B group vitamins: Secondary | ICD-10-CM | POA: Diagnosis not present

## 2019-05-12 DIAGNOSIS — E291 Testicular hypofunction: Secondary | ICD-10-CM | POA: Diagnosis not present

## 2019-05-12 DIAGNOSIS — I619 Nontraumatic intracerebral hemorrhage, unspecified: Secondary | ICD-10-CM | POA: Diagnosis not present

## 2019-05-13 DIAGNOSIS — I5032 Chronic diastolic (congestive) heart failure: Secondary | ICD-10-CM | POA: Diagnosis not present

## 2019-05-13 DIAGNOSIS — I110 Hypertensive heart disease with heart failure: Secondary | ICD-10-CM | POA: Diagnosis not present

## 2019-05-13 DIAGNOSIS — G40909 Epilepsy, unspecified, not intractable, without status epilepticus: Secondary | ICD-10-CM | POA: Diagnosis not present

## 2019-05-13 DIAGNOSIS — D329 Benign neoplasm of meninges, unspecified: Secondary | ICD-10-CM | POA: Diagnosis not present

## 2019-05-13 DIAGNOSIS — I69154 Hemiplegia and hemiparesis following nontraumatic intracerebral hemorrhage affecting left non-dominant side: Secondary | ICD-10-CM | POA: Diagnosis not present

## 2019-05-13 DIAGNOSIS — R296 Repeated falls: Secondary | ICD-10-CM | POA: Diagnosis not present

## 2019-05-15 DIAGNOSIS — I259 Chronic ischemic heart disease, unspecified: Secondary | ICD-10-CM | POA: Diagnosis not present

## 2019-05-15 DIAGNOSIS — E1159 Type 2 diabetes mellitus with other circulatory complications: Secondary | ICD-10-CM | POA: Diagnosis not present

## 2019-05-17 DIAGNOSIS — G40909 Epilepsy, unspecified, not intractable, without status epilepticus: Secondary | ICD-10-CM | POA: Diagnosis not present

## 2019-05-17 DIAGNOSIS — I69154 Hemiplegia and hemiparesis following nontraumatic intracerebral hemorrhage affecting left non-dominant side: Secondary | ICD-10-CM | POA: Diagnosis not present

## 2019-05-17 DIAGNOSIS — I5032 Chronic diastolic (congestive) heart failure: Secondary | ICD-10-CM | POA: Diagnosis not present

## 2019-05-17 DIAGNOSIS — R296 Repeated falls: Secondary | ICD-10-CM | POA: Diagnosis not present

## 2019-05-17 DIAGNOSIS — D329 Benign neoplasm of meninges, unspecified: Secondary | ICD-10-CM | POA: Diagnosis not present

## 2019-05-17 DIAGNOSIS — I110 Hypertensive heart disease with heart failure: Secondary | ICD-10-CM | POA: Diagnosis not present

## 2019-05-18 DIAGNOSIS — Z719 Counseling, unspecified: Secondary | ICD-10-CM | POA: Diagnosis not present

## 2019-05-18 DIAGNOSIS — E1140 Type 2 diabetes mellitus with diabetic neuropathy, unspecified: Secondary | ICD-10-CM | POA: Diagnosis not present

## 2019-05-18 DIAGNOSIS — I619 Nontraumatic intracerebral hemorrhage, unspecified: Secondary | ICD-10-CM | POA: Diagnosis not present

## 2019-05-19 DIAGNOSIS — I69154 Hemiplegia and hemiparesis following nontraumatic intracerebral hemorrhage affecting left non-dominant side: Secondary | ICD-10-CM | POA: Diagnosis not present

## 2019-05-19 DIAGNOSIS — I5032 Chronic diastolic (congestive) heart failure: Secondary | ICD-10-CM | POA: Diagnosis not present

## 2019-05-19 DIAGNOSIS — G40909 Epilepsy, unspecified, not intractable, without status epilepticus: Secondary | ICD-10-CM | POA: Diagnosis not present

## 2019-05-19 DIAGNOSIS — I110 Hypertensive heart disease with heart failure: Secondary | ICD-10-CM | POA: Diagnosis not present

## 2019-05-19 DIAGNOSIS — R296 Repeated falls: Secondary | ICD-10-CM | POA: Diagnosis not present

## 2019-05-19 DIAGNOSIS — D329 Benign neoplasm of meninges, unspecified: Secondary | ICD-10-CM | POA: Diagnosis not present

## 2019-05-25 DIAGNOSIS — D329 Benign neoplasm of meninges, unspecified: Secondary | ICD-10-CM | POA: Diagnosis not present

## 2019-05-25 DIAGNOSIS — E230 Hypopituitarism: Secondary | ICD-10-CM | POA: Insufficient documentation

## 2019-05-25 DIAGNOSIS — G40909 Epilepsy, unspecified, not intractable, without status epilepticus: Secondary | ICD-10-CM | POA: Diagnosis not present

## 2019-05-29 DIAGNOSIS — E291 Testicular hypofunction: Secondary | ICD-10-CM | POA: Diagnosis not present

## 2019-05-29 DIAGNOSIS — E538 Deficiency of other specified B group vitamins: Secondary | ICD-10-CM | POA: Diagnosis not present

## 2019-05-30 DIAGNOSIS — I110 Hypertensive heart disease with heart failure: Secondary | ICD-10-CM | POA: Diagnosis not present

## 2019-05-30 DIAGNOSIS — R296 Repeated falls: Secondary | ICD-10-CM | POA: Diagnosis not present

## 2019-05-30 DIAGNOSIS — D329 Benign neoplasm of meninges, unspecified: Secondary | ICD-10-CM | POA: Diagnosis not present

## 2019-05-30 DIAGNOSIS — G40909 Epilepsy, unspecified, not intractable, without status epilepticus: Secondary | ICD-10-CM | POA: Diagnosis not present

## 2019-05-30 DIAGNOSIS — I69154 Hemiplegia and hemiparesis following nontraumatic intracerebral hemorrhage affecting left non-dominant side: Secondary | ICD-10-CM | POA: Diagnosis not present

## 2019-05-30 DIAGNOSIS — I5032 Chronic diastolic (congestive) heart failure: Secondary | ICD-10-CM | POA: Diagnosis not present

## 2019-06-02 DIAGNOSIS — R296 Repeated falls: Secondary | ICD-10-CM | POA: Diagnosis not present

## 2019-06-02 DIAGNOSIS — I69154 Hemiplegia and hemiparesis following nontraumatic intracerebral hemorrhage affecting left non-dominant side: Secondary | ICD-10-CM | POA: Diagnosis not present

## 2019-06-02 DIAGNOSIS — G40909 Epilepsy, unspecified, not intractable, without status epilepticus: Secondary | ICD-10-CM | POA: Diagnosis not present

## 2019-06-02 DIAGNOSIS — I5032 Chronic diastolic (congestive) heart failure: Secondary | ICD-10-CM | POA: Diagnosis not present

## 2019-06-02 DIAGNOSIS — I110 Hypertensive heart disease with heart failure: Secondary | ICD-10-CM | POA: Diagnosis not present

## 2019-06-02 DIAGNOSIS — D329 Benign neoplasm of meninges, unspecified: Secondary | ICD-10-CM | POA: Diagnosis not present

## 2019-06-07 DIAGNOSIS — I110 Hypertensive heart disease with heart failure: Secondary | ICD-10-CM | POA: Diagnosis not present

## 2019-06-07 DIAGNOSIS — G40909 Epilepsy, unspecified, not intractable, without status epilepticus: Secondary | ICD-10-CM | POA: Diagnosis not present

## 2019-06-07 DIAGNOSIS — I5032 Chronic diastolic (congestive) heart failure: Secondary | ICD-10-CM | POA: Diagnosis not present

## 2019-06-07 DIAGNOSIS — D329 Benign neoplasm of meninges, unspecified: Secondary | ICD-10-CM | POA: Diagnosis not present

## 2019-06-07 DIAGNOSIS — R296 Repeated falls: Secondary | ICD-10-CM | POA: Diagnosis not present

## 2019-06-07 DIAGNOSIS — I69154 Hemiplegia and hemiparesis following nontraumatic intracerebral hemorrhage affecting left non-dominant side: Secondary | ICD-10-CM | POA: Diagnosis not present

## 2019-06-08 DIAGNOSIS — J449 Chronic obstructive pulmonary disease, unspecified: Secondary | ICD-10-CM | POA: Diagnosis not present

## 2019-06-08 DIAGNOSIS — I69154 Hemiplegia and hemiparesis following nontraumatic intracerebral hemorrhage affecting left non-dominant side: Secondary | ICD-10-CM | POA: Diagnosis not present

## 2019-06-08 DIAGNOSIS — D329 Benign neoplasm of meninges, unspecified: Secondary | ICD-10-CM | POA: Diagnosis not present

## 2019-06-08 DIAGNOSIS — I2510 Atherosclerotic heart disease of native coronary artery without angina pectoris: Secondary | ICD-10-CM | POA: Diagnosis not present

## 2019-06-08 DIAGNOSIS — I5032 Chronic diastolic (congestive) heart failure: Secondary | ICD-10-CM | POA: Diagnosis not present

## 2019-06-08 DIAGNOSIS — N400 Enlarged prostate without lower urinary tract symptoms: Secondary | ICD-10-CM | POA: Diagnosis not present

## 2019-06-08 DIAGNOSIS — I110 Hypertensive heart disease with heart failure: Secondary | ICD-10-CM | POA: Diagnosis not present

## 2019-06-08 DIAGNOSIS — Z951 Presence of aortocoronary bypass graft: Secondary | ICD-10-CM | POA: Diagnosis not present

## 2019-06-08 DIAGNOSIS — E119 Type 2 diabetes mellitus without complications: Secondary | ICD-10-CM | POA: Diagnosis not present

## 2019-06-08 DIAGNOSIS — R296 Repeated falls: Secondary | ICD-10-CM | POA: Diagnosis not present

## 2019-06-08 DIAGNOSIS — G40909 Epilepsy, unspecified, not intractable, without status epilepticus: Secondary | ICD-10-CM | POA: Diagnosis not present

## 2019-06-08 DIAGNOSIS — Z794 Long term (current) use of insulin: Secondary | ICD-10-CM | POA: Diagnosis not present

## 2019-06-08 DIAGNOSIS — I739 Peripheral vascular disease, unspecified: Secondary | ICD-10-CM | POA: Diagnosis not present

## 2019-06-12 DIAGNOSIS — E039 Hypothyroidism, unspecified: Secondary | ICD-10-CM | POA: Diagnosis not present

## 2019-06-12 DIAGNOSIS — Z125 Encounter for screening for malignant neoplasm of prostate: Secondary | ICD-10-CM | POA: Diagnosis not present

## 2019-06-12 DIAGNOSIS — E538 Deficiency of other specified B group vitamins: Secondary | ICD-10-CM | POA: Diagnosis not present

## 2019-06-12 DIAGNOSIS — Z794 Long term (current) use of insulin: Secondary | ICD-10-CM | POA: Diagnosis not present

## 2019-06-12 DIAGNOSIS — E559 Vitamin D deficiency, unspecified: Secondary | ICD-10-CM | POA: Diagnosis not present

## 2019-06-12 DIAGNOSIS — E1142 Type 2 diabetes mellitus with diabetic polyneuropathy: Secondary | ICD-10-CM | POA: Diagnosis not present

## 2019-06-12 DIAGNOSIS — D329 Benign neoplasm of meninges, unspecified: Secondary | ICD-10-CM | POA: Diagnosis not present

## 2019-06-12 DIAGNOSIS — E291 Testicular hypofunction: Secondary | ICD-10-CM | POA: Diagnosis not present

## 2019-06-12 DIAGNOSIS — R630 Anorexia: Secondary | ICD-10-CM | POA: Diagnosis not present

## 2019-06-15 DIAGNOSIS — I110 Hypertensive heart disease with heart failure: Secondary | ICD-10-CM | POA: Diagnosis not present

## 2019-06-15 DIAGNOSIS — G40909 Epilepsy, unspecified, not intractable, without status epilepticus: Secondary | ICD-10-CM | POA: Diagnosis not present

## 2019-06-15 DIAGNOSIS — R296 Repeated falls: Secondary | ICD-10-CM | POA: Diagnosis not present

## 2019-06-15 DIAGNOSIS — I5032 Chronic diastolic (congestive) heart failure: Secondary | ICD-10-CM | POA: Diagnosis not present

## 2019-06-15 DIAGNOSIS — I69154 Hemiplegia and hemiparesis following nontraumatic intracerebral hemorrhage affecting left non-dominant side: Secondary | ICD-10-CM | POA: Diagnosis not present

## 2019-06-15 DIAGNOSIS — D329 Benign neoplasm of meninges, unspecified: Secondary | ICD-10-CM | POA: Diagnosis not present

## 2019-06-16 DIAGNOSIS — D329 Benign neoplasm of meninges, unspecified: Secondary | ICD-10-CM | POA: Diagnosis not present

## 2019-06-16 DIAGNOSIS — I110 Hypertensive heart disease with heart failure: Secondary | ICD-10-CM | POA: Diagnosis not present

## 2019-06-16 DIAGNOSIS — I5032 Chronic diastolic (congestive) heart failure: Secondary | ICD-10-CM | POA: Diagnosis not present

## 2019-06-16 DIAGNOSIS — R296 Repeated falls: Secondary | ICD-10-CM | POA: Diagnosis not present

## 2019-06-16 DIAGNOSIS — I69154 Hemiplegia and hemiparesis following nontraumatic intracerebral hemorrhage affecting left non-dominant side: Secondary | ICD-10-CM | POA: Diagnosis not present

## 2019-06-16 DIAGNOSIS — G40909 Epilepsy, unspecified, not intractable, without status epilepticus: Secondary | ICD-10-CM | POA: Diagnosis not present

## 2019-06-19 DIAGNOSIS — E291 Testicular hypofunction: Secondary | ICD-10-CM | POA: Diagnosis not present

## 2019-06-19 DIAGNOSIS — R630 Anorexia: Secondary | ICD-10-CM | POA: Diagnosis not present

## 2019-06-19 DIAGNOSIS — E039 Hypothyroidism, unspecified: Secondary | ICD-10-CM | POA: Diagnosis not present

## 2019-06-19 DIAGNOSIS — E1142 Type 2 diabetes mellitus with diabetic polyneuropathy: Secondary | ICD-10-CM | POA: Diagnosis not present

## 2019-06-19 DIAGNOSIS — D329 Benign neoplasm of meninges, unspecified: Secondary | ICD-10-CM | POA: Diagnosis not present

## 2019-06-27 DIAGNOSIS — D329 Benign neoplasm of meninges, unspecified: Secondary | ICD-10-CM | POA: Diagnosis not present

## 2019-06-27 DIAGNOSIS — R220 Localized swelling, mass and lump, head: Secondary | ICD-10-CM | POA: Diagnosis not present

## 2019-06-28 DIAGNOSIS — E538 Deficiency of other specified B group vitamins: Secondary | ICD-10-CM | POA: Diagnosis not present

## 2019-06-28 DIAGNOSIS — E291 Testicular hypofunction: Secondary | ICD-10-CM | POA: Diagnosis not present

## 2019-06-29 DIAGNOSIS — I2510 Atherosclerotic heart disease of native coronary artery without angina pectoris: Secondary | ICD-10-CM | POA: Diagnosis not present

## 2019-06-29 DIAGNOSIS — E785 Hyperlipidemia, unspecified: Secondary | ICD-10-CM | POA: Diagnosis not present

## 2019-06-29 DIAGNOSIS — I10 Essential (primary) hypertension: Secondary | ICD-10-CM | POA: Diagnosis not present

## 2019-06-29 DIAGNOSIS — Z01810 Encounter for preprocedural cardiovascular examination: Secondary | ICD-10-CM | POA: Diagnosis not present

## 2019-06-29 DIAGNOSIS — I482 Chronic atrial fibrillation, unspecified: Secondary | ICD-10-CM | POA: Diagnosis not present

## 2019-06-29 DIAGNOSIS — I5032 Chronic diastolic (congestive) heart failure: Secondary | ICD-10-CM | POA: Diagnosis not present

## 2019-07-04 DIAGNOSIS — D329 Benign neoplasm of meninges, unspecified: Secondary | ICD-10-CM | POA: Diagnosis not present

## 2019-07-04 DIAGNOSIS — I6203 Nontraumatic chronic subdural hemorrhage: Secondary | ICD-10-CM | POA: Insufficient documentation

## 2019-07-04 DIAGNOSIS — G40909 Epilepsy, unspecified, not intractable, without status epilepticus: Secondary | ICD-10-CM | POA: Diagnosis not present

## 2019-07-10 DIAGNOSIS — I6203 Nontraumatic chronic subdural hemorrhage: Secondary | ICD-10-CM | POA: Diagnosis not present

## 2019-07-11 DIAGNOSIS — I6203 Nontraumatic chronic subdural hemorrhage: Secondary | ICD-10-CM | POA: Diagnosis not present

## 2019-07-11 DIAGNOSIS — D329 Benign neoplasm of meninges, unspecified: Secondary | ICD-10-CM | POA: Diagnosis not present

## 2019-07-12 DIAGNOSIS — E291 Testicular hypofunction: Secondary | ICD-10-CM | POA: Diagnosis not present

## 2019-07-12 DIAGNOSIS — E538 Deficiency of other specified B group vitamins: Secondary | ICD-10-CM | POA: Diagnosis not present

## 2019-07-13 ENCOUNTER — Other Ambulatory Visit: Payer: Self-pay

## 2019-07-13 ENCOUNTER — Encounter: Payer: Self-pay | Admitting: Sports Medicine

## 2019-07-13 ENCOUNTER — Ambulatory Visit (INDEPENDENT_AMBULATORY_CARE_PROVIDER_SITE_OTHER): Payer: Medicare Other | Admitting: Sports Medicine

## 2019-07-13 DIAGNOSIS — B351 Tinea unguium: Secondary | ICD-10-CM

## 2019-07-13 DIAGNOSIS — I739 Peripheral vascular disease, unspecified: Secondary | ICD-10-CM | POA: Diagnosis not present

## 2019-07-13 DIAGNOSIS — M79676 Pain in unspecified toe(s): Secondary | ICD-10-CM | POA: Diagnosis not present

## 2019-07-13 DIAGNOSIS — E1140 Type 2 diabetes mellitus with diabetic neuropathy, unspecified: Secondary | ICD-10-CM

## 2019-07-13 NOTE — Progress Notes (Signed)
Patient ID: Alan Beltran, male   DOB: 1934-06-13, 85 y.o.   MRN: [id number] Subjective: Alan Beltran is a 85 y.o. male patient with history of diabetes who returns to office today complaining of long, painful nails while ambulating in shoes; unable to trim. Patient states that the glucose reading was 128and last A1c of 9.  Reports saw PCP Dr. Venetia Maxon, 1 month ago.  Patient also admits he was diagnosed with a brain tumor recently that is being treated by medications.  No other issues noted.  Patient Active Problem List   Diagnosis Date Noted  . Diabetes mellitus 09/04/2010   Current Outpatient Medications on File Prior to Visit  Medication Sig Dispense Refill  . levETIRAcetam (KEPPRA) 500 MG tablet Take by mouth.    . ALLOPURINOL PO Take by mouth.      . bethanechol (URECHOLINE) 10 MG tablet Take by mouth.    . budesonide-formoterol (SYMBICORT) 160-4.5 MCG/ACT inhaler Inhale into the lungs.    . Cholecalciferol 50 MCG (2000 UT) TABS Take by mouth.    . Dulaglutide (TRULICITY) 1.5 CH/8.5ID SOPN Inject into the skin.    Marland Kitchen escitalopram (LEXAPRO) 10 MG tablet Take by mouth.    . famotidine (PEPCID) 20 MG tablet Take by mouth.    . finasteride (PROSCAR) 5 MG tablet Take by mouth.    . FOLIC ACID PO Take by mouth.      . Levothyroxine Sodium (SYNTHROID PO) Take by mouth.      . metFORMIN (GLUCOPHAGE) 500 MG tablet TAKE 1 TABLET BY MOUTH TWICE DAILY WITH MEALS FOR 30 DAYS    . metFORMIN (GLUCOPHAGE-XR) 500 MG 24 hr tablet TAKE TWO TABLETS BY MOUTH ONCE DAILY WITH EVENING MEAL  1  . NOVOLIN N RELION 100 UNIT/ML injection INJECT 10 UNITS SUBCUTANEOUSLY ONCE DAILY AS DIRECTED AT BEDTIME FOR 30 DAYS    . OMEPRAZOLE PO Take by mouth.      . Pioglitazone HCl (ACTOS PO) Take by mouth.      . pramipexole (MIRAPEX) 1 MG tablet TAKE 1 TABLET BY MOUTH AT BEDTIME . APPOINTMENT REQUIRED FOR FUTURE REFILLS    . Rosuvastatin Calcium (CRESTOR PO) Take by mouth.      . Semaglutide, 1 MG/DOSE, (OZEMPIC, 1 MG/DOSE,) 2  MG/1.5ML SOPN Inject into the skin.    Marland Kitchen SERTRALINE HCL PO Take by mouth.      . SPIRONOLACTONE PO Take by mouth.      . TERAZOSIN HCL PO Take by mouth.      . TRUEPLUS LANCETS 33G MISC   5  . TRUETRACK TEST test strip   4  . WARFARIN SODIUM PO Take by mouth.      . Zinc Sulfate (ZINC 15 PO) Take by mouth.       No current facility-administered medications on file prior to visit.   Allergies  Allergen Reactions  . Codeine Nausea And Vomiting    No results found for this or any previous visit (from the past 2160 hour(s)).  Objective: General: Patient is awake, alert, and oriented x 3 and in no acute distress.  Integument: Skin is warm, dry and supple bilateral. Nails are tender, long, thickened and dystrophic with subungual debris, consistent with onychomycosis, 1-5 bilateral.  No open lesions or preulcerative lesions present bilateral,  Remaining integument unremarkable.  Vasculature:  Dorsalis Pedis pulse 1/4 bilateral. Posterior Tibial pulse  0/4 bilateral. Capillary fill time <3 sec 1-5 bilateral. Diminished hair growth to the level of the digits.Temperature gradient within  normal limits. No varicosities present bilateral.  Trace chronic edema present bilateral.   Neurology: The patient has intact sensation measured with a 5.07/10g Semmes Weinstein Monofilament at all pedal sites bilateral.  Musculoskeletal: Asymptomatic hammertoe pedal deformities noted bilateral. Muscular strength 5/5 in all lower extremity muscular groups bilateral without pain on range of motion. No tenderness with calf compression bilateral.  Assessment and Plan: Problem List Items Addressed This Visit    None    Visit Diagnoses    Pain due to onychomycosis of toenail    -  Primary   Type 2 diabetes, controlled, with neuropathy (HCC)       Relevant Medications   Dulaglutide (TRULICITY) 1.5 HK/2.5JD SOPN   Semaglutide, 1 MG/DOSE, (OZEMPIC, 1 MG/DOSE,) 2 MG/1.5ML SOPN   PVD (peripheral vascular disease)  ([place])         -Examined patient. -Continue with daily inspection of feet in the setting of diabetes like previous -Mechanically debrided all nails 1-5 bilateral using sterile nail nipper and filed with dremel without incident  -Patient to return in 3 months for at risk foot/diabetic nail care -Patient advised to call the office if any problems or questions arise in the meantime.  Alan Beltran, DPM

## 2019-07-26 DIAGNOSIS — E1159 Type 2 diabetes mellitus with other circulatory complications: Secondary | ICD-10-CM | POA: Diagnosis not present

## 2019-07-26 DIAGNOSIS — I5030 Unspecified diastolic (congestive) heart failure: Secondary | ICD-10-CM | POA: Diagnosis not present

## 2019-07-26 DIAGNOSIS — K219 Gastro-esophageal reflux disease without esophagitis: Secondary | ICD-10-CM | POA: Diagnosis not present

## 2019-07-26 DIAGNOSIS — I110 Hypertensive heart disease with heart failure: Secondary | ICD-10-CM | POA: Diagnosis not present

## 2019-08-09 ENCOUNTER — Encounter: Payer: Self-pay | Admitting: Sports Medicine

## 2019-08-09 ENCOUNTER — Other Ambulatory Visit: Payer: Self-pay | Admitting: Sports Medicine

## 2019-08-09 ENCOUNTER — Other Ambulatory Visit: Payer: Self-pay

## 2019-08-09 ENCOUNTER — Ambulatory Visit (INDEPENDENT_AMBULATORY_CARE_PROVIDER_SITE_OTHER): Payer: Medicare Other | Admitting: Sports Medicine

## 2019-08-09 DIAGNOSIS — I739 Peripheral vascular disease, unspecified: Secondary | ICD-10-CM

## 2019-08-09 DIAGNOSIS — E1140 Type 2 diabetes mellitus with diabetic neuropathy, unspecified: Secondary | ICD-10-CM

## 2019-08-09 DIAGNOSIS — M79671 Pain in right foot: Secondary | ICD-10-CM

## 2019-08-09 DIAGNOSIS — B351 Tinea unguium: Secondary | ICD-10-CM

## 2019-08-09 DIAGNOSIS — M79676 Pain in unspecified toe(s): Secondary | ICD-10-CM

## 2019-08-09 NOTE — Progress Notes (Signed)
Patient ID: Alan Beltran, male   DOB: 21-Mar-1934, 85 y.o.   MRN: [id number] Subjective: Alan Beltran is a 85 y.o. male patient with history of diabetes who returns to office today complaining of pain at fifth toes nails at the lateral aspect feels like the nail has grown out and is digging into the skin started having sensitivity on the right fifth toe on Saturday but now it does not hurt want to have his toes check have been applying tea tree oil and reports that his blood sugar was not recorded for today and last A1c 9.  No other issues noted.  Patient Active Problem List   Diagnosis Date Noted  . Diabetes mellitus 09/04/2010   Current Outpatient Medications on File Prior to Visit  Medication Sig Dispense Refill  . ALLOPURINOL PO Take by mouth.      . bethanechol (URECHOLINE) 10 MG tablet Take by mouth.    . budesonide-formoterol (SYMBICORT) 160-4.5 MCG/ACT inhaler Inhale into the lungs.    . Cholecalciferol 50 MCG (2000 UT) TABS Take by mouth.    . Dulaglutide (TRULICITY) 1.5 OE/4.2PN SOPN Inject into the skin.    Marland Kitchen escitalopram (LEXAPRO) 10 MG tablet Take by mouth.    . famotidine (PEPCID) 20 MG tablet Take by mouth.    . finasteride (PROSCAR) 5 MG tablet Take by mouth.    . FOLIC ACID PO Take by mouth.      . levETIRAcetam (KEPPRA) 500 MG tablet Take by mouth.    . Levothyroxine Sodium (SYNTHROID PO) Take by mouth.      . metFORMIN (GLUCOPHAGE) 500 MG tablet TAKE 1 TABLET BY MOUTH TWICE DAILY WITH MEALS FOR 30 DAYS    . metFORMIN (GLUCOPHAGE-XR) 500 MG 24 hr tablet TAKE TWO TABLETS BY MOUTH ONCE DAILY WITH EVENING MEAL  1  . NOVOLIN N RELION 100 UNIT/ML injection INJECT 10 UNITS SUBCUTANEOUSLY ONCE DAILY AS DIRECTED AT BEDTIME FOR 30 DAYS    . OMEPRAZOLE PO Take by mouth.      . Pioglitazone HCl (ACTOS PO) Take by mouth.      . pramipexole (MIRAPEX) 1 MG tablet TAKE 1 TABLET BY MOUTH AT BEDTIME . APPOINTMENT REQUIRED FOR FUTURE REFILLS    . Rosuvastatin Calcium (CRESTOR PO) Take by mouth.       . Semaglutide, 1 MG/DOSE, (OZEMPIC, 1 MG/DOSE,) 2 MG/1.5ML SOPN Inject into the skin.    Marland Kitchen SERTRALINE HCL PO Take by mouth.      . SPIRONOLACTONE PO Take by mouth.      . TERAZOSIN HCL PO Take by mouth.      . TRUEPLUS LANCETS 33G MISC   5  . TRUETRACK TEST test strip   4  . WARFARIN SODIUM PO Take by mouth.      . Zinc Sulfate (ZINC 15 PO) Take by mouth.       No current facility-administered medications on file prior to visit.   Allergies  Allergen Reactions  . Codeine Nausea And Vomiting    No results found for this or any previous visit (from the past 2160 hour(s)).  Objective: General: Patient is awake, alert, and oriented x 3 and in no acute distress.  Integument: Skin is warm, dry and supple bilateral. Nails are tender, and bilateral fifth toes are mildly elongated, thickened and dystrophic with subungual debris, consistent with onychomycosis, all other nails are mycotic but short and asymptomatic currently.  No open lesions or preulcerative lesions present bilateral,  Remaining integument unremarkable.  Vasculature:  Dorsalis Pedis pulse 1/4 bilateral. Posterior Tibial pulse  0/4 bilateral. Capillary fill time <3 sec 1-5 bilateral. Diminished hair growth to the level of the digits.Temperature gradient within normal limits. No varicosities present bilateral.  Trace chronic edema present bilateral.   Neurology: The patient has intact sensation measured with a 5.07/10g Semmes Weinstein Monofilament at all pedal sites bilateral.  Musculoskeletal: Asymptomatic hammertoe pedal deformities noted bilateral. Muscular strength 5/5 in all lower extremity muscular groups bilateral without pain on range of motion. No tenderness with calf compression bilateral.  Assessment and Plan: Problem List Items Addressed This Visit    None    Visit Diagnoses    Pain due to onychomycosis of toenail    -  Primary   Type 2 diabetes, controlled, with neuropathy (HCC)       PVD (peripheral  vascular disease) ([place])         -Examined patient. -Continue with daily inspection of feet in the setting of diabetes like before -Mechanically debrided all nails bilateral fifth toenails using sterile nail nipper and filed with dremel without incident  -Advised patient he can continue with tea tree oil or using Vicks VapoRub to the toenails to help soften them -Patient advised to call the office if any problems or questions arise in the meantime. -Return as scheduled for routine nail trim in September  Landis Martins, DPM

## 2019-08-17 DIAGNOSIS — E1140 Type 2 diabetes mellitus with diabetic neuropathy, unspecified: Secondary | ICD-10-CM | POA: Diagnosis not present

## 2019-08-17 DIAGNOSIS — Z719 Counseling, unspecified: Secondary | ICD-10-CM | POA: Diagnosis not present

## 2019-08-17 DIAGNOSIS — I619 Nontraumatic intracerebral hemorrhage, unspecified: Secondary | ICD-10-CM | POA: Diagnosis not present

## 2019-08-17 DIAGNOSIS — G40909 Epilepsy, unspecified, not intractable, without status epilepticus: Secondary | ICD-10-CM | POA: Diagnosis not present

## 2019-08-23 DIAGNOSIS — E538 Deficiency of other specified B group vitamins: Secondary | ICD-10-CM | POA: Diagnosis not present

## 2019-08-23 DIAGNOSIS — E291 Testicular hypofunction: Secondary | ICD-10-CM | POA: Diagnosis not present

## 2019-08-26 DIAGNOSIS — K219 Gastro-esophageal reflux disease without esophagitis: Secondary | ICD-10-CM | POA: Diagnosis not present

## 2019-08-26 DIAGNOSIS — I5030 Unspecified diastolic (congestive) heart failure: Secondary | ICD-10-CM | POA: Diagnosis not present

## 2019-08-26 DIAGNOSIS — I110 Hypertensive heart disease with heart failure: Secondary | ICD-10-CM | POA: Diagnosis not present

## 2019-08-26 DIAGNOSIS — E1159 Type 2 diabetes mellitus with other circulatory complications: Secondary | ICD-10-CM | POA: Diagnosis not present

## 2019-09-05 DIAGNOSIS — E538 Deficiency of other specified B group vitamins: Secondary | ICD-10-CM | POA: Diagnosis not present

## 2019-09-05 DIAGNOSIS — E291 Testicular hypofunction: Secondary | ICD-10-CM | POA: Diagnosis not present

## 2019-09-19 DIAGNOSIS — E538 Deficiency of other specified B group vitamins: Secondary | ICD-10-CM | POA: Diagnosis not present

## 2019-09-19 DIAGNOSIS — E1142 Type 2 diabetes mellitus with diabetic polyneuropathy: Secondary | ICD-10-CM | POA: Diagnosis not present

## 2019-09-19 DIAGNOSIS — Z794 Long term (current) use of insulin: Secondary | ICD-10-CM | POA: Diagnosis not present

## 2019-09-19 DIAGNOSIS — E1165 Type 2 diabetes mellitus with hyperglycemia: Secondary | ICD-10-CM | POA: Diagnosis not present

## 2019-09-19 DIAGNOSIS — E039 Hypothyroidism, unspecified: Secondary | ICD-10-CM | POA: Diagnosis not present

## 2019-09-19 DIAGNOSIS — E291 Testicular hypofunction: Secondary | ICD-10-CM | POA: Diagnosis not present

## 2019-09-19 DIAGNOSIS — Z125 Encounter for screening for malignant neoplasm of prostate: Secondary | ICD-10-CM | POA: Diagnosis not present

## 2019-09-19 DIAGNOSIS — D329 Benign neoplasm of meninges, unspecified: Secondary | ICD-10-CM | POA: Diagnosis not present

## 2019-10-03 DIAGNOSIS — E291 Testicular hypofunction: Secondary | ICD-10-CM | POA: Diagnosis not present

## 2019-10-03 DIAGNOSIS — E538 Deficiency of other specified B group vitamins: Secondary | ICD-10-CM | POA: Diagnosis not present

## 2019-10-06 ENCOUNTER — Other Ambulatory Visit: Payer: Self-pay

## 2019-10-06 ENCOUNTER — Ambulatory Visit (INDEPENDENT_AMBULATORY_CARE_PROVIDER_SITE_OTHER): Payer: Medicare Other | Admitting: Sports Medicine

## 2019-10-06 ENCOUNTER — Encounter: Payer: Self-pay | Admitting: Sports Medicine

## 2019-10-06 DIAGNOSIS — B351 Tinea unguium: Secondary | ICD-10-CM | POA: Diagnosis not present

## 2019-10-06 DIAGNOSIS — E1140 Type 2 diabetes mellitus with diabetic neuropathy, unspecified: Secondary | ICD-10-CM | POA: Diagnosis not present

## 2019-10-06 DIAGNOSIS — I739 Peripheral vascular disease, unspecified: Secondary | ICD-10-CM | POA: Diagnosis not present

## 2019-10-06 DIAGNOSIS — M79676 Pain in unspecified toe(s): Secondary | ICD-10-CM

## 2019-10-06 NOTE — Progress Notes (Signed)
Patient ID: Alarik Radu, male   DOB: 03-08-1934, 85 y.o.   MRN: [id number] Subjective: Hager Compston is a 85 y.o. male patient with history of diabetes who returns to office today complaining of pain at nails unable to trim, patient reports that his blood sugar was not recorded for today and last A1c 9 like before. No other issues noted.  Patient Active Problem List   Diagnosis Date Noted  . Diabetes mellitus 09/04/2010   Current Outpatient Medications on File Prior to Visit  Medication Sig Dispense Refill  . ALLOPURINOL PO Take by mouth.      . bethanechol (URECHOLINE) 10 MG tablet Take by mouth.    . budesonide-formoterol (SYMBICORT) 160-4.5 MCG/ACT inhaler Inhale into the lungs.    . Cholecalciferol 50 MCG (2000 UT) TABS Take by mouth.    . Dulaglutide (TRULICITY) 1.5 GY/6.9SW SOPN Inject into the skin.    Marland Kitchen escitalopram (LEXAPRO) 10 MG tablet Take by mouth.    . famotidine (PEPCID) 20 MG tablet Take by mouth.    . finasteride (PROSCAR) 5 MG tablet Take by mouth.    . FOLIC ACID PO Take by mouth.      . levETIRAcetam (KEPPRA) 500 MG tablet Take by mouth.    . Levothyroxine Sodium (SYNTHROID PO) Take by mouth.      . metFORMIN (GLUCOPHAGE) 500 MG tablet TAKE 1 TABLET BY MOUTH TWICE DAILY WITH MEALS FOR 30 DAYS    . metFORMIN (GLUCOPHAGE-XR) 500 MG 24 hr tablet TAKE TWO TABLETS BY MOUTH ONCE DAILY WITH EVENING MEAL  1  . NOVOLIN N RELION 100 UNIT/ML injection INJECT 10 UNITS SUBCUTANEOUSLY ONCE DAILY AS DIRECTED AT BEDTIME FOR 30 DAYS    . OMEPRAZOLE PO Take by mouth.      . Pioglitazone HCl (ACTOS PO) Take by mouth.      . pramipexole (MIRAPEX) 1 MG tablet TAKE 1 TABLET BY MOUTH AT BEDTIME . APPOINTMENT REQUIRED FOR FUTURE REFILLS    . Rosuvastatin Calcium (CRESTOR PO) Take by mouth.      . Semaglutide, 1 MG/DOSE, (OZEMPIC, 1 MG/DOSE,) 2 MG/1.5ML SOPN Inject into the skin.    Marland Kitchen SERTRALINE HCL PO Take by mouth.      . SPIRONOLACTONE PO Take by mouth.      . TERAZOSIN HCL PO Take by mouth.       . TRUEPLUS LANCETS 33G MISC   5  . TRUETRACK TEST test strip   4  . WARFARIN SODIUM PO Take by mouth.      . Zinc Sulfate (ZINC 15 PO) Take by mouth.       No current facility-administered medications on file prior to visit.   Allergies  Allergen Reactions  . Codeine Nausea And Vomiting    No results found for this or any previous visit (from the past 2160 hour(s)).  Objective: General: Patient is awake, alert, and oriented x 3 and in no acute distress.  Integument: Skin is warm, dry and supple bilateral. Nails are tender, mildly elongated, thickened and dystrophic with subungual debris, consistent with onychomycosis, all other nails are mycotic but short and asymptomatic currently.  No open lesions or preulcerative lesions present bilateral,  Remaining integument unremarkable.  Vasculature:  Dorsalis Pedis pulse 1/4 bilateral. Posterior Tibial pulse  0/4 bilateral. Capillary fill time <3 sec 1-5 bilateral. Diminished hair growth to the level of the digits.Temperature gradient within normal limits. No varicosities present bilateral.  Trace chronic edema present bilateral.   Neurology: The patient has  intact sensation measured with a 5.07/10g Semmes Weinstein Monofilament at all pedal sites bilateral.  Musculoskeletal: Asymptomatic hammertoe pedal deformities noted bilateral. Muscular strength 5/5 in all lower extremity muscular groups bilateral without pain on range of motion. No tenderness with calf compression bilateral.  Assessment and Plan: Problem List Items Addressed This Visit    None    Visit Diagnoses    Pain due to onychomycosis of toenail    -  Primary   Type 2 diabetes, controlled, with neuropathy ([place])       PVD (peripheral vascular disease) ([place])         -Examined patient. -Mechanically debrided all nails toenails using sterile nail nipper and filed with dremel without incident  -Encouraged elevation of legs to assist with edema control and to continue taking  diuretic as directed by PCP -Patient advised to call the office if any problems or questions arise in the meantime. -Return in 3 months for nail trim.   Landis Martins, DPM

## 2019-10-16 DIAGNOSIS — W19XXXA Unspecified fall, initial encounter: Secondary | ICD-10-CM | POA: Diagnosis not present

## 2019-10-16 DIAGNOSIS — Z719 Counseling, unspecified: Secondary | ICD-10-CM | POA: Diagnosis not present

## 2019-10-16 DIAGNOSIS — G40909 Epilepsy, unspecified, not intractable, without status epilepticus: Secondary | ICD-10-CM | POA: Diagnosis not present

## 2019-10-18 DIAGNOSIS — E291 Testicular hypofunction: Secondary | ICD-10-CM | POA: Diagnosis not present

## 2019-10-18 DIAGNOSIS — E538 Deficiency of other specified B group vitamins: Secondary | ICD-10-CM | POA: Diagnosis not present

## 2019-10-20 DIAGNOSIS — G40909 Epilepsy, unspecified, not intractable, without status epilepticus: Secondary | ICD-10-CM | POA: Diagnosis not present

## 2019-10-20 DIAGNOSIS — R930 Abnormal findings on diagnostic imaging of skull and head, not elsewhere classified: Secondary | ICD-10-CM | POA: Diagnosis not present

## 2019-10-20 DIAGNOSIS — G43909 Migraine, unspecified, not intractable, without status migrainosus: Secondary | ICD-10-CM | POA: Diagnosis not present

## 2019-10-20 DIAGNOSIS — Z8673 Personal history of transient ischemic attack (TIA), and cerebral infarction without residual deficits: Secondary | ICD-10-CM | POA: Diagnosis not present

## 2019-10-20 DIAGNOSIS — G319 Degenerative disease of nervous system, unspecified: Secondary | ICD-10-CM | POA: Diagnosis not present

## 2019-10-26 DIAGNOSIS — E1159 Type 2 diabetes mellitus with other circulatory complications: Secondary | ICD-10-CM | POA: Diagnosis not present

## 2019-10-26 DIAGNOSIS — I110 Hypertensive heart disease with heart failure: Secondary | ICD-10-CM | POA: Diagnosis not present

## 2019-10-26 DIAGNOSIS — I5030 Unspecified diastolic (congestive) heart failure: Secondary | ICD-10-CM | POA: Diagnosis not present

## 2019-10-26 DIAGNOSIS — K219 Gastro-esophageal reflux disease without esophagitis: Secondary | ICD-10-CM | POA: Diagnosis not present

## 2019-10-31 DIAGNOSIS — E291 Testicular hypofunction: Secondary | ICD-10-CM | POA: Diagnosis not present

## 2019-10-31 DIAGNOSIS — E538 Deficiency of other specified B group vitamins: Secondary | ICD-10-CM | POA: Diagnosis not present

## 2019-11-14 DIAGNOSIS — E236 Other disorders of pituitary gland: Secondary | ICD-10-CM | POA: Diagnosis not present

## 2019-11-14 DIAGNOSIS — E538 Deficiency of other specified B group vitamins: Secondary | ICD-10-CM | POA: Diagnosis not present

## 2019-11-15 DIAGNOSIS — H90A22 Sensorineural hearing loss, unilateral, left ear, with restricted hearing on the contralateral side: Secondary | ICD-10-CM | POA: Diagnosis not present

## 2019-11-15 DIAGNOSIS — R42 Dizziness and giddiness: Secondary | ICD-10-CM | POA: Diagnosis not present

## 2019-11-15 DIAGNOSIS — H90A31 Mixed conductive and sensorineural hearing loss, unilateral, right ear with restricted hearing on the contralateral side: Secondary | ICD-10-CM | POA: Diagnosis not present

## 2019-11-24 DIAGNOSIS — I484 Atypical atrial flutter: Secondary | ICD-10-CM | POA: Diagnosis not present

## 2019-11-24 DIAGNOSIS — I4891 Unspecified atrial fibrillation: Secondary | ICD-10-CM | POA: Diagnosis not present

## 2019-11-25 DIAGNOSIS — E1159 Type 2 diabetes mellitus with other circulatory complications: Secondary | ICD-10-CM | POA: Diagnosis not present

## 2019-11-25 DIAGNOSIS — K219 Gastro-esophageal reflux disease without esophagitis: Secondary | ICD-10-CM | POA: Diagnosis not present

## 2019-11-25 DIAGNOSIS — I5030 Unspecified diastolic (congestive) heart failure: Secondary | ICD-10-CM | POA: Diagnosis not present

## 2019-11-25 DIAGNOSIS — I110 Hypertensive heart disease with heart failure: Secondary | ICD-10-CM | POA: Diagnosis not present

## 2019-11-28 DIAGNOSIS — E291 Testicular hypofunction: Secondary | ICD-10-CM | POA: Diagnosis not present

## 2019-11-28 DIAGNOSIS — E538 Deficiency of other specified B group vitamins: Secondary | ICD-10-CM | POA: Diagnosis not present

## 2019-12-13 DIAGNOSIS — E538 Deficiency of other specified B group vitamins: Secondary | ICD-10-CM | POA: Diagnosis not present

## 2019-12-13 DIAGNOSIS — E291 Testicular hypofunction: Secondary | ICD-10-CM | POA: Diagnosis not present

## 2019-12-14 DIAGNOSIS — I872 Venous insufficiency (chronic) (peripheral): Secondary | ICD-10-CM | POA: Diagnosis not present

## 2019-12-26 DIAGNOSIS — E538 Deficiency of other specified B group vitamins: Secondary | ICD-10-CM | POA: Diagnosis not present

## 2019-12-26 DIAGNOSIS — E1159 Type 2 diabetes mellitus with other circulatory complications: Secondary | ICD-10-CM | POA: Diagnosis not present

## 2019-12-26 DIAGNOSIS — I5030 Unspecified diastolic (congestive) heart failure: Secondary | ICD-10-CM | POA: Diagnosis not present

## 2019-12-26 DIAGNOSIS — I110 Hypertensive heart disease with heart failure: Secondary | ICD-10-CM | POA: Diagnosis not present

## 2019-12-26 DIAGNOSIS — K219 Gastro-esophageal reflux disease without esophagitis: Secondary | ICD-10-CM | POA: Diagnosis not present

## 2019-12-26 DIAGNOSIS — E291 Testicular hypofunction: Secondary | ICD-10-CM | POA: Diagnosis not present

## 2020-01-05 ENCOUNTER — Other Ambulatory Visit: Payer: Self-pay

## 2020-01-05 ENCOUNTER — Encounter: Payer: Self-pay | Admitting: Sports Medicine

## 2020-01-05 ENCOUNTER — Ambulatory Visit (INDEPENDENT_AMBULATORY_CARE_PROVIDER_SITE_OTHER): Payer: Medicare Other | Admitting: Sports Medicine

## 2020-01-05 DIAGNOSIS — E1140 Type 2 diabetes mellitus with diabetic neuropathy, unspecified: Secondary | ICD-10-CM

## 2020-01-05 DIAGNOSIS — B351 Tinea unguium: Secondary | ICD-10-CM | POA: Diagnosis not present

## 2020-01-05 DIAGNOSIS — I739 Peripheral vascular disease, unspecified: Secondary | ICD-10-CM

## 2020-01-05 DIAGNOSIS — M79676 Pain in unspecified toe(s): Secondary | ICD-10-CM

## 2020-01-05 NOTE — Progress Notes (Signed)
Patient ID: Alan Beltran, male   DOB: 09-11-1934, 85 y.o.   MRN: [id number] Subjective: Alan Beltran is a 85 y.o. male patient with history of diabetes who returns to office today complaining of pain at nails unable to trim, patient reports that his blood sugar was 116 and last visit to Dr. Venetia Maxon was 1 month ago. No other issues noted.  Patient Active Problem List   Diagnosis Date Noted  . Chronic subdural hematoma ([place]) 07/04/2019  . Panhypopituitarism ([place]) 05/25/2019  . Paroxysmal atrial fibrillation ([place]) 05/09/2019  . Meningioma ([place]) 05/08/2019  . Multifactorial gait disorder 05/08/2019  . Seizure disorder ([place]) 05/07/2019  . Acquired hypothyroidism 05/05/2019  . Altered mental status, unspecified 05/05/2019  . Benign prostatic hyperplasia with lower urinary tract symptoms 05/05/2019  . Coronary artery disease involving native coronary artery 05/05/2019  . Pulmonary emphysema ([place]) 05/05/2019  . Type 2 diabetes mellitus without complication, with long-term current use of insulin ([place]) 05/05/2019  . Diabetes mellitus 09/04/2010   Current Outpatient Medications on File Prior to Visit  Medication Sig Dispense Refill  . ALLOPURINOL PO Take by mouth.      . bethanechol (URECHOLINE) 10 MG tablet Take by mouth.    . budesonide-formoterol (SYMBICORT) 160-4.5 MCG/ACT inhaler Inhale into the lungs.    . Cholecalciferol 50 MCG (2000 UT) TABS Take by mouth.    . Continuous Blood Gluc Sensor (FREESTYLE LIBRE 14 DAY SENSOR) MISC SMARTSIG:1 Each Topical Every 2 Weeks    . Dulaglutide (TRULICITY) 1.5 ER/7.4YC SOPN Inject into the skin.    Marland Kitchen escitalopram (LEXAPRO) 10 MG tablet Take by mouth.    . famotidine (PEPCID) 20 MG tablet Take by mouth.    . finasteride (PROSCAR) 5 MG tablet Take by mouth.    . FOLIC ACID PO Take by mouth.      . levETIRAcetam (KEPPRA) 500 MG tablet Take by mouth.    . Levothyroxine Sodium (SYNTHROID PO) Take by mouth.      . metFORMIN (GLUCOPHAGE) 500 MG tablet TAKE 1  TABLET BY MOUTH TWICE DAILY WITH MEALS FOR 30 DAYS    . metFORMIN (GLUCOPHAGE-XR) 500 MG 24 hr tablet TAKE TWO TABLETS BY MOUTH ONCE DAILY WITH EVENING MEAL  1  . NOVOLIN N RELION 100 UNIT/ML injection INJECT 10 UNITS SUBCUTANEOUSLY ONCE DAILY AS DIRECTED AT BEDTIME FOR 30 DAYS    . OMEPRAZOLE PO Take by mouth.      . Pioglitazone HCl (ACTOS PO) Take by mouth.      . pramipexole (MIRAPEX) 1 MG tablet TAKE 1 TABLET BY MOUTH AT BEDTIME . APPOINTMENT REQUIRED FOR FUTURE REFILLS    . Rosuvastatin Calcium (CRESTOR PO) Take by mouth.      . Semaglutide, 1 MG/DOSE, (OZEMPIC, 1 MG/DOSE,) 2 MG/1.5ML SOPN Inject into the skin.    Marland Kitchen SERTRALINE HCL PO Take by mouth.      . SPIRONOLACTONE PO Take by mouth.      . TERAZOSIN HCL PO Take by mouth.      . TRUEPLUS LANCETS 33G MISC   5  . TRUETRACK TEST test strip   4  . WARFARIN SODIUM PO Take by mouth.      . Zinc Sulfate (ZINC 15 PO) Take by mouth.       No current facility-administered medications on file prior to visit.   Allergies  Allergen Reactions  . Codeine Nausea And Vomiting    No results found for this or any previous visit (from the past 2160 hour(s)).  Objective: General: Patient is awake, alert, and oriented x 3 and in no acute distress.  Integument: Skin is warm, dry and supple bilateral. Nails are tender, mildly elongated, thickened and dystrophic with subungual debris, consistent with onychomycosis, all other nails are mycotic but short and asymptomatic currently.  No open lesions or preulcerative lesions present bilateral,  Remaining integument unremarkable.  Vasculature:  Dorsalis Pedis pulse 1/4 bilateral. Posterior Tibial pulse  0/4 bilateral. Capillary fill time <3 sec 1-5 bilateral. Diminished hair growth to the level of the digits.Temperature gradient within normal limits. No varicosities present bilateral.  Trace chronic edema present bilateral.   Neurology: The patient has intact sensation measured with a 5.07/10g Semmes  Weinstein Monofilament at all pedal sites bilateral.  Musculoskeletal: Asymptomatic hammertoe pedal deformities noted bilateral. Muscular strength 5/5 in all lower extremity muscular groups bilateral without pain on range of motion. No tenderness with calf compression bilateral.  Assessment and Plan: Problem List Items Addressed This Visit   None   Visit Diagnoses    Pain due to onychomycosis of toenail    -  Primary   Type 2 diabetes, controlled, with neuropathy ([place])       PVD (peripheral vascular disease) ([place])         -Examined patient. -Mechanically debrided all nails toenails using sterile nail nipper and filed with dremel without incident  -Recommend foot miracle cream as needed for dry skin -Patient advised to call the office if any problems or questions arise in the meantime. -Return in 3 months for nail trim.   Landis Martins, DPM

## 2020-01-09 DIAGNOSIS — E538 Deficiency of other specified B group vitamins: Secondary | ICD-10-CM | POA: Diagnosis not present

## 2020-01-09 DIAGNOSIS — E291 Testicular hypofunction: Secondary | ICD-10-CM | POA: Diagnosis not present

## 2020-01-11 DIAGNOSIS — D692 Other nonthrombocytopenic purpura: Secondary | ICD-10-CM | POA: Diagnosis not present

## 2020-01-11 DIAGNOSIS — I872 Venous insufficiency (chronic) (peripheral): Secondary | ICD-10-CM | POA: Diagnosis not present

## 2020-01-12 DIAGNOSIS — I2510 Atherosclerotic heart disease of native coronary artery without angina pectoris: Secondary | ICD-10-CM | POA: Diagnosis not present

## 2020-01-12 DIAGNOSIS — E785 Hyperlipidemia, unspecified: Secondary | ICD-10-CM | POA: Diagnosis not present

## 2020-01-12 DIAGNOSIS — I482 Chronic atrial fibrillation, unspecified: Secondary | ICD-10-CM | POA: Diagnosis not present

## 2020-01-12 DIAGNOSIS — I10 Essential (primary) hypertension: Secondary | ICD-10-CM | POA: Diagnosis not present

## 2020-01-12 DIAGNOSIS — I5032 Chronic diastolic (congestive) heart failure: Secondary | ICD-10-CM | POA: Diagnosis not present

## 2020-01-15 DIAGNOSIS — G40909 Epilepsy, unspecified, not intractable, without status epilepticus: Secondary | ICD-10-CM | POA: Diagnosis not present

## 2020-01-15 DIAGNOSIS — Z719 Counseling, unspecified: Secondary | ICD-10-CM | POA: Diagnosis not present

## 2020-01-23 DIAGNOSIS — E291 Testicular hypofunction: Secondary | ICD-10-CM | POA: Diagnosis not present

## 2020-01-23 DIAGNOSIS — E538 Deficiency of other specified B group vitamins: Secondary | ICD-10-CM | POA: Diagnosis not present

## 2020-01-25 DIAGNOSIS — E1159 Type 2 diabetes mellitus with other circulatory complications: Secondary | ICD-10-CM | POA: Diagnosis not present

## 2020-01-25 DIAGNOSIS — I5030 Unspecified diastolic (congestive) heart failure: Secondary | ICD-10-CM | POA: Diagnosis not present

## 2020-01-25 DIAGNOSIS — K219 Gastro-esophageal reflux disease without esophagitis: Secondary | ICD-10-CM | POA: Diagnosis not present

## 2020-01-25 DIAGNOSIS — I110 Hypertensive heart disease with heart failure: Secondary | ICD-10-CM | POA: Diagnosis not present

## 2020-02-19 DIAGNOSIS — H40053 Ocular hypertension, bilateral: Secondary | ICD-10-CM | POA: Diagnosis not present

## 2020-02-20 DIAGNOSIS — E538 Deficiency of other specified B group vitamins: Secondary | ICD-10-CM | POA: Diagnosis not present

## 2020-02-20 DIAGNOSIS — E291 Testicular hypofunction: Secondary | ICD-10-CM | POA: Diagnosis not present

## 2020-02-21 DIAGNOSIS — E785 Hyperlipidemia, unspecified: Secondary | ICD-10-CM | POA: Diagnosis not present

## 2020-02-21 DIAGNOSIS — G720 Drug-induced myopathy: Secondary | ICD-10-CM | POA: Diagnosis not present

## 2020-02-21 DIAGNOSIS — E1159 Type 2 diabetes mellitus with other circulatory complications: Secondary | ICD-10-CM | POA: Diagnosis not present

## 2020-02-21 DIAGNOSIS — T466X5A Adverse effect of antihyperlipidemic and antiarteriosclerotic drugs, initial encounter: Secondary | ICD-10-CM | POA: Diagnosis not present

## 2020-02-25 DIAGNOSIS — I110 Hypertensive heart disease with heart failure: Secondary | ICD-10-CM | POA: Diagnosis not present

## 2020-02-25 DIAGNOSIS — I5030 Unspecified diastolic (congestive) heart failure: Secondary | ICD-10-CM | POA: Diagnosis not present

## 2020-02-25 DIAGNOSIS — K219 Gastro-esophageal reflux disease without esophagitis: Secondary | ICD-10-CM | POA: Diagnosis not present

## 2020-02-25 DIAGNOSIS — E1159 Type 2 diabetes mellitus with other circulatory complications: Secondary | ICD-10-CM | POA: Diagnosis not present

## 2020-02-27 DIAGNOSIS — Z0000 Encounter for general adult medical examination without abnormal findings: Secondary | ICD-10-CM | POA: Diagnosis not present

## 2020-03-07 DIAGNOSIS — G40909 Epilepsy, unspecified, not intractable, without status epilepticus: Secondary | ICD-10-CM | POA: Diagnosis not present

## 2020-03-07 DIAGNOSIS — Z719 Counseling, unspecified: Secondary | ICD-10-CM | POA: Diagnosis not present

## 2020-03-07 DIAGNOSIS — R2689 Other abnormalities of gait and mobility: Secondary | ICD-10-CM | POA: Diagnosis not present

## 2020-03-08 DIAGNOSIS — E291 Testicular hypofunction: Secondary | ICD-10-CM | POA: Diagnosis not present

## 2020-03-08 DIAGNOSIS — E538 Deficiency of other specified B group vitamins: Secondary | ICD-10-CM | POA: Diagnosis not present

## 2020-03-11 DIAGNOSIS — E785 Hyperlipidemia, unspecified: Secondary | ICD-10-CM | POA: Diagnosis not present

## 2020-03-11 DIAGNOSIS — I2510 Atherosclerotic heart disease of native coronary artery without angina pectoris: Secondary | ICD-10-CM | POA: Diagnosis not present

## 2020-03-11 DIAGNOSIS — I482 Chronic atrial fibrillation, unspecified: Secondary | ICD-10-CM | POA: Diagnosis not present

## 2020-03-11 DIAGNOSIS — I5032 Chronic diastolic (congestive) heart failure: Secondary | ICD-10-CM | POA: Diagnosis not present

## 2020-03-11 DIAGNOSIS — I10 Essential (primary) hypertension: Secondary | ICD-10-CM | POA: Diagnosis not present

## 2020-03-11 DIAGNOSIS — R0602 Shortness of breath: Secondary | ICD-10-CM | POA: Diagnosis not present

## 2020-03-12 DIAGNOSIS — I872 Venous insufficiency (chronic) (peripheral): Secondary | ICD-10-CM | POA: Diagnosis not present

## 2020-03-12 DIAGNOSIS — Z85828 Personal history of other malignant neoplasm of skin: Secondary | ICD-10-CM | POA: Diagnosis not present

## 2020-03-12 DIAGNOSIS — L218 Other seborrheic dermatitis: Secondary | ICD-10-CM | POA: Diagnosis not present

## 2020-03-12 DIAGNOSIS — L821 Other seborrheic keratosis: Secondary | ICD-10-CM | POA: Diagnosis not present

## 2020-03-19 DIAGNOSIS — E538 Deficiency of other specified B group vitamins: Secondary | ICD-10-CM | POA: Diagnosis not present

## 2020-03-19 DIAGNOSIS — E236 Other disorders of pituitary gland: Secondary | ICD-10-CM | POA: Diagnosis not present

## 2020-03-25 DIAGNOSIS — I110 Hypertensive heart disease with heart failure: Secondary | ICD-10-CM | POA: Diagnosis not present

## 2020-03-25 DIAGNOSIS — I5030 Unspecified diastolic (congestive) heart failure: Secondary | ICD-10-CM | POA: Diagnosis not present

## 2020-03-25 DIAGNOSIS — E1159 Type 2 diabetes mellitus with other circulatory complications: Secondary | ICD-10-CM | POA: Diagnosis not present

## 2020-03-25 DIAGNOSIS — K219 Gastro-esophageal reflux disease without esophagitis: Secondary | ICD-10-CM | POA: Diagnosis not present

## 2020-04-02 DIAGNOSIS — E236 Other disorders of pituitary gland: Secondary | ICD-10-CM | POA: Diagnosis not present

## 2020-04-02 DIAGNOSIS — E538 Deficiency of other specified B group vitamins: Secondary | ICD-10-CM | POA: Diagnosis not present

## 2020-04-05 ENCOUNTER — Other Ambulatory Visit: Payer: Self-pay

## 2020-04-05 ENCOUNTER — Encounter: Payer: Self-pay | Admitting: Sports Medicine

## 2020-04-05 ENCOUNTER — Ambulatory Visit (INDEPENDENT_AMBULATORY_CARE_PROVIDER_SITE_OTHER): Payer: Medicare Other | Admitting: Sports Medicine

## 2020-04-05 DIAGNOSIS — E1140 Type 2 diabetes mellitus with diabetic neuropathy, unspecified: Secondary | ICD-10-CM

## 2020-04-05 DIAGNOSIS — B351 Tinea unguium: Secondary | ICD-10-CM

## 2020-04-05 DIAGNOSIS — D320 Benign neoplasm of cerebral meninges: Secondary | ICD-10-CM | POA: Insufficient documentation

## 2020-04-05 DIAGNOSIS — Z6836 Body mass index (BMI) 36.0-36.9, adult: Secondary | ICD-10-CM | POA: Insufficient documentation

## 2020-04-05 DIAGNOSIS — K5730 Diverticulosis of large intestine without perforation or abscess without bleeding: Secondary | ICD-10-CM | POA: Insufficient documentation

## 2020-04-05 DIAGNOSIS — E538 Deficiency of other specified B group vitamins: Secondary | ICD-10-CM | POA: Insufficient documentation

## 2020-04-05 DIAGNOSIS — E785 Hyperlipidemia, unspecified: Secondary | ICD-10-CM | POA: Insufficient documentation

## 2020-04-05 DIAGNOSIS — M109 Gout, unspecified: Secondary | ICD-10-CM | POA: Insufficient documentation

## 2020-04-05 DIAGNOSIS — N529 Male erectile dysfunction, unspecified: Secondary | ICD-10-CM | POA: Insufficient documentation

## 2020-04-05 DIAGNOSIS — I739 Peripheral vascular disease, unspecified: Secondary | ICD-10-CM | POA: Diagnosis not present

## 2020-04-05 DIAGNOSIS — E1142 Type 2 diabetes mellitus with diabetic polyneuropathy: Secondary | ICD-10-CM | POA: Insufficient documentation

## 2020-04-05 DIAGNOSIS — E7800 Pure hypercholesterolemia, unspecified: Secondary | ICD-10-CM | POA: Insufficient documentation

## 2020-04-05 DIAGNOSIS — Z794 Long term (current) use of insulin: Secondary | ICD-10-CM | POA: Insufficient documentation

## 2020-04-05 DIAGNOSIS — R630 Anorexia: Secondary | ICD-10-CM | POA: Insufficient documentation

## 2020-04-05 DIAGNOSIS — E559 Vitamin D deficiency, unspecified: Secondary | ICD-10-CM | POA: Insufficient documentation

## 2020-04-05 DIAGNOSIS — G4733 Obstructive sleep apnea (adult) (pediatric): Secondary | ICD-10-CM | POA: Insufficient documentation

## 2020-04-05 DIAGNOSIS — I259 Chronic ischemic heart disease, unspecified: Secondary | ICD-10-CM | POA: Insufficient documentation

## 2020-04-05 DIAGNOSIS — J45909 Unspecified asthma, uncomplicated: Secondary | ICD-10-CM | POA: Insufficient documentation

## 2020-04-05 DIAGNOSIS — M79676 Pain in unspecified toe(s): Secondary | ICD-10-CM

## 2020-04-05 DIAGNOSIS — E291 Testicular hypofunction: Secondary | ICD-10-CM | POA: Insufficient documentation

## 2020-04-05 NOTE — Progress Notes (Signed)
Patient ID: Alan Beltran, male   DOB: 1934-11-26, 86 y.o.   MRN: [id number] Subjective: Alan Beltran is a 86 y.o. male patient with history of diabetes who returns to office today complaining of pain at nails unable to trim, patient reports that his blood sugar was "good". Had issues with swelling a few months ago, now doing better. No other issues noted.   Patient Active Problem List   Diagnosis Date Noted  . Asthma 04/05/2020  . Benign neoplasm of cerebral meninges ([place]) 04/05/2020  . Body mass index (BMI) 36.0-36.9, adult 04/05/2020  . Chronic ischemic heart disease 04/05/2020  . Diverticular disease of colon 04/05/2020  . Erectile dysfunction 04/05/2020  . Gout 04/05/2020  . Hyperlipidemia 04/05/2020  . Long term (current) use of insulin ([place]) 04/05/2020  . Loss of appetite 04/05/2020  . Obstructive sleep apnea syndrome 04/05/2020  . Polyneuropathy due to type 2 diabetes mellitus ([place]) 04/05/2020  . Pure hypercholesterolemia 04/05/2020  . Testicular hypofunction 04/05/2020  . Vitamin B12 deficiency 04/05/2020  . Vitamin D deficiency 04/05/2020  . Chronic subdural hematoma ([place]) 07/04/2019  . 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 Paroxysmal atrial fibrillation ([place]) 05/09/2019  . Meningioma ([place]) 05/08/2019  . Multifactorial gait disorder 05/08/2019  . Seizure disorder ([place]) 05/07/2019  . Acquired hypothyroidism 05/05/2019  . Altered mental status, unspecified 05/05/2019  . Benign prostatic hyperplasia with lower urinary tract symptoms 05/05/2019  . Coronary artery disease involving native coronary artery 05/05/2019  . Pulmonary emphysema ([place]) 05/05/2019  . Type 2 diabetes mellitus without complication, with long-term current use of insulin ([place]) 05/05/2019  . Diabetes mellitus 09/04/2010  . Adenomatous polyp of colon 09/13/2008  . 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  ALLOPURINOL PO Take by mouth.      . bethanechol (URECHOLINE) 10 MG tablet Take by mouth.    . budesonide-formoterol (SYMBICORT) 160-4.5 MCG/ACT inhaler Inhale into the lungs.    . Cholecalciferol 50 MCG (2000 UT) TABS Take by mouth.    . Continuous Blood Gluc Sensor (FREESTYLE LIBRE 14 DAY SENSOR) MISC SMARTSIG:1 Each Topical Every 2 Weeks    . Dulaglutide (TRULICITY) 1.5 QJ/3.3LK SOPN Inject into the skin.    Marland Kitchen escitalopram (LEXAPRO) 10 MG tablet Take by mouth.    . famotidine (PEPCID) 20 MG tablet Take by mouth.    . finasteride (PROSCAR) 5 MG tablet Take by mouth.    . FOLIC ACID PO Take by mouth.      . levETIRAcetam (KEPPRA) 500 MG tablet Take by mouth.    . Levothyroxine Sodium (SYNTHROID PO) Take by mouth.      . metFORMIN (GLUCOPHAGE) 500 MG tablet TAKE 1 TABLET BY MOUTH TWICE DAILY WITH MEALS FOR 30 DAYS    . metFORMIN (GLUCOPHAGE-XR) 500 MG 24 hr tablet TAKE TWO TABLETS BY MOUTH ONCE DAILY WITH EVENING MEAL  1  . NOVOLIN N RELION 100 UNIT/ML injection INJECT 10 UNITS SUBCUTANEOUSLY ONCE DAILY AS DIRECTED AT BEDTIME FOR 30 DAYS    . OMEPRAZOLE PO Take by mouth.      . Pioglitazone HCl (ACTOS PO) Take by mouth.      . pramipexole (MIRAPEX) 1 MG tablet TAKE 1 TABLET BY MOUTH AT BEDTIME . APPOINTMENT REQUIRED FOR FUTURE REFILLS    . Rosuvastatin Calcium (CRESTOR PO) Take by mouth.      . Semaglutide, 1 MG/DOSE, (OZEMPIC, 1 MG/DOSE,) 2 MG/1.5ML SOPN Inject into the  skin.    Marland Kitchen SERTRALINE HCL PO Take by mouth.      . SPIRONOLACTONE PO Take by mouth.      . TERAZOSIN HCL PO Take by mouth.      . TRUEPLUS LANCETS 33G MISC   5  . TRUETRACK TEST test strip   4  . WARFARIN SODIUM PO Take by mouth.      . Zinc Sulfate (ZINC 15 PO) Take by mouth.       No current facility-administered medications on file prior to visit.   Allergies  Allergen Reactions  . Codeine Nausea And Vomiting  . Metformin Hcl Diarrhea    No results found for this or any previous visit (from the past 2160  hour(s)).  Objective: General: Patient is awake, alert, and oriented x 3 and in no acute distress.  Integument: Skin is warm, dry and supple bilateral. Nails are tender, mildly elongated, thickened and dystrophic with subungual debris, consistent with onychomycosis.  No open lesions or preulcerative lesions present bilateral,  Remaining integument unremarkable.  Vasculature:  Dorsalis Pedis pulse 1/4 bilateral. Posterior Tibial pulse  0/4 bilateral. Capillary fill time <3 sec 1-5 bilateral. Diminished hair growth to the level of the digits.Temperature gradient within normal limits. No varicosities present bilateral.  Trace chronic edema present bilateral.   Neurology: The patient has intact sensation measured with a 5.07/10g Semmes Weinstein Monofilament at all pedal sites bilateral.  Musculoskeletal: Asymptomatic hammertoe pedal deformities noted bilateral. Muscular strength 5/5 in all lower extremity muscular groups bilateral without pain on range of motion. No tenderness with calf compression bilateral.  Assessment and Plan: Problem List Items Addressed This Visit   None   Visit Diagnoses    Pain due to onychomycosis of toenail    -  Primary   Type 2 diabetes, controlled, with neuropathy ([place])       PVD (peripheral vascular disease) ([place])         -Examined patient. -Mechanically debrided all nails toenails using sterile nail nipper and filed with dremel without incident  -Patient advised to call the office if any problems or questions arise in the meantime. -Return in 3 months for nail trim.   Landis Martins, DPM

## 2020-04-16 DIAGNOSIS — E538 Deficiency of other specified B group vitamins: Secondary | ICD-10-CM | POA: Diagnosis not present

## 2020-04-16 DIAGNOSIS — E291 Testicular hypofunction: Secondary | ICD-10-CM | POA: Diagnosis not present

## 2020-04-22 DIAGNOSIS — H40053 Ocular hypertension, bilateral: Secondary | ICD-10-CM | POA: Diagnosis not present

## 2020-04-26 DIAGNOSIS — I2510 Atherosclerotic heart disease of native coronary artery without angina pectoris: Secondary | ICD-10-CM | POA: Diagnosis not present

## 2020-04-26 DIAGNOSIS — I5032 Chronic diastolic (congestive) heart failure: Secondary | ICD-10-CM | POA: Diagnosis not present

## 2020-04-26 DIAGNOSIS — E785 Hyperlipidemia, unspecified: Secondary | ICD-10-CM | POA: Diagnosis not present

## 2020-04-26 DIAGNOSIS — I10 Essential (primary) hypertension: Secondary | ICD-10-CM | POA: Diagnosis not present

## 2020-04-26 DIAGNOSIS — I482 Chronic atrial fibrillation, unspecified: Secondary | ICD-10-CM | POA: Diagnosis not present

## 2020-05-02 DIAGNOSIS — E236 Other disorders of pituitary gland: Secondary | ICD-10-CM | POA: Diagnosis not present

## 2020-05-02 DIAGNOSIS — E538 Deficiency of other specified B group vitamins: Secondary | ICD-10-CM | POA: Diagnosis not present

## 2020-05-14 DIAGNOSIS — E291 Testicular hypofunction: Secondary | ICD-10-CM | POA: Diagnosis not present

## 2020-05-14 DIAGNOSIS — E538 Deficiency of other specified B group vitamins: Secondary | ICD-10-CM | POA: Diagnosis not present

## 2020-05-28 DIAGNOSIS — E538 Deficiency of other specified B group vitamins: Secondary | ICD-10-CM | POA: Diagnosis not present

## 2020-05-28 DIAGNOSIS — E291 Testicular hypofunction: Secondary | ICD-10-CM | POA: Diagnosis not present

## 2020-06-17 DIAGNOSIS — H40053 Ocular hypertension, bilateral: Secondary | ICD-10-CM | POA: Diagnosis not present

## 2020-06-25 DIAGNOSIS — E291 Testicular hypofunction: Secondary | ICD-10-CM | POA: Diagnosis not present

## 2020-06-25 DIAGNOSIS — E538 Deficiency of other specified B group vitamins: Secondary | ICD-10-CM | POA: Diagnosis not present

## 2020-07-09 ENCOUNTER — Ambulatory Visit: Payer: Medicare Other | Admitting: Sports Medicine

## 2020-07-09 DIAGNOSIS — R269 Unspecified abnormalities of gait and mobility: Secondary | ICD-10-CM | POA: Diagnosis not present

## 2020-07-09 DIAGNOSIS — R220 Localized swelling, mass and lump, head: Secondary | ICD-10-CM | POA: Diagnosis not present

## 2020-07-09 DIAGNOSIS — R2243 Localized swelling, mass and lump, lower limb, bilateral: Secondary | ICD-10-CM | POA: Diagnosis not present

## 2020-07-09 DIAGNOSIS — D329 Benign neoplasm of meninges, unspecified: Secondary | ICD-10-CM | POA: Diagnosis not present

## 2020-07-09 DIAGNOSIS — M7989 Other specified soft tissue disorders: Secondary | ICD-10-CM | POA: Diagnosis not present

## 2020-07-10 DIAGNOSIS — L97919 Non-pressure chronic ulcer of unspecified part of right lower leg with unspecified severity: Secondary | ICD-10-CM | POA: Diagnosis not present

## 2020-07-10 DIAGNOSIS — I83019 Varicose veins of right lower extremity with ulcer of unspecified site: Secondary | ICD-10-CM | POA: Diagnosis not present

## 2020-07-10 DIAGNOSIS — E291 Testicular hypofunction: Secondary | ICD-10-CM | POA: Diagnosis not present

## 2020-07-10 DIAGNOSIS — L03116 Cellulitis of left lower limb: Secondary | ICD-10-CM | POA: Diagnosis not present

## 2020-07-10 DIAGNOSIS — L03115 Cellulitis of right lower limb: Secondary | ICD-10-CM | POA: Diagnosis not present

## 2020-07-10 DIAGNOSIS — E538 Deficiency of other specified B group vitamins: Secondary | ICD-10-CM | POA: Diagnosis not present

## 2020-07-16 ENCOUNTER — Encounter: Payer: Self-pay | Admitting: Sports Medicine

## 2020-07-16 ENCOUNTER — Other Ambulatory Visit: Payer: Self-pay

## 2020-07-16 ENCOUNTER — Ambulatory Visit (INDEPENDENT_AMBULATORY_CARE_PROVIDER_SITE_OTHER): Payer: Medicare Other | Admitting: Sports Medicine

## 2020-07-16 DIAGNOSIS — B351 Tinea unguium: Secondary | ICD-10-CM

## 2020-07-16 DIAGNOSIS — M79676 Pain in unspecified toe(s): Secondary | ICD-10-CM | POA: Diagnosis not present

## 2020-07-16 DIAGNOSIS — I739 Peripheral vascular disease, unspecified: Secondary | ICD-10-CM | POA: Diagnosis not present

## 2020-07-16 DIAGNOSIS — E1140 Type 2 diabetes mellitus with diabetic neuropathy, unspecified: Secondary | ICD-10-CM

## 2020-07-16 NOTE — Patient Instructions (Signed)
Forensic scientist from [facility] or [facility]

## 2020-07-16 NOTE — Progress Notes (Signed)
Patient ID: Alan Beltran, male   DOB: 12-07-1934, 86 y.o.   MRN: [id number] Subjective: Alan Beltran is a 86 y.o. male patient with history of diabetes who returns to office today complaining of pain at nails unable to trim, patient reports that his blood sugar was 123 and last A1c is 9.  Patient reports that he is dealing with cellulitis in the legs currently on oral antibiotics given by PCP and reports that he is getting his leg wrapped by his PCPs office as well on the right.  Patient Active Problem List   Diagnosis Date Noted   Asthma 04/05/2020   Benign neoplasm of cerebral meninges ([place]) 04/05/2020   Body mass index (BMI) 36.0-36.9, adult 04/05/2020   Chronic ischemic heart disease 04/05/2020   Diverticular disease of colon 04/05/2020   Erectile dysfunction 04/05/2020   Gout 04/05/2020   Hyperlipidemia 04/05/2020   Long term (current) use of insulin ([place]) 04/05/2020   Loss of appetite 04/05/2020   Obstructive sleep apnea syndrome 04/05/2020   Polyneuropathy due to type 2 diabetes mellitus ([place]) 04/05/2020   Pure hypercholesterolemia 04/05/2020   Testicular hypofunction 04/05/2020   Vitamin B12 deficiency 04/05/2020   Vitamin D deficiency 04/05/2020   Chronic subdural hematoma ([place]) 07/04/2019   Panhypopituitarism ([place]) 05/25/2019   Paroxysmal atrial fibrillation ([place]) 05/09/2019   Meningioma ([place]) 05/08/2019   Multifactorial gait disorder 05/08/2019   Seizure disorder ([place]) 05/07/2019   Acquired hypothyroidism 05/05/2019   Altered mental status, unspecified 05/05/2019   Benign prostatic hyperplasia with lower urinary tract symptoms 05/05/2019   Coronary artery disease involving native coronary artery 05/05/2019   Pulmonary emphysema ([place]) 05/05/2019   Type 2 diabetes mellitus without complication, with long-term current use of insulin ([place]) 05/05/2019   Diabetes mellitus 09/04/2010   Adenomatous polyp of colon 09/13/2008   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       bethanechol (URECHOLINE) 10 MG tablet Take by mouth.     budesonide-formoterol (SYMBICORT) 160-4.5 MCG/ACT inhaler Inhale into the lungs.     Cholecalciferol 50 MCG (2000 UT) TABS Take by mouth.     Continuous Blood Gluc Sensor (FREESTYLE LIBRE 14 DAY SENSOR) MISC SMARTSIG:1 Each Topical Every 2 Weeks     Dulaglutide (TRULICITY) 1.5 UV/2.5DG SOPN Inject into the skin.     escitalopram (LEXAPRO) 10 MG tablet Take by mouth.     famotidine (PEPCID) 20 MG tablet Take by mouth.     finasteride (PROSCAR) 5 MG tablet Take by mouth.     FOLIC ACID PO Take by mouth.       levETIRAcetam (KEPPRA) 500 MG tablet Take by mouth.     Levothyroxine Sodium (SYNTHROID PO) Take by mouth.       metFORMIN (GLUCOPHAGE) 500 MG tablet TAKE 1 TABLET BY MOUTH TWICE DAILY WITH MEALS FOR 30 DAYS     metFORMIN (GLUCOPHAGE-XR) 500 MG 24 hr tablet TAKE TWO TABLETS BY MOUTH ONCE DAILY WITH EVENING MEAL  1   NOVOLIN N RELION 100 UNIT/ML injection INJECT 10 UNITS SUBCUTANEOUSLY ONCE DAILY AS DIRECTED AT BEDTIME FOR 30 DAYS     OMEPRAZOLE PO Take by mouth.       Pioglitazone HCl (ACTOS PO) Take by mouth.       pramipexole (MIRAPEX) 1 MG tablet TAKE 1 TABLET BY MOUTH AT BEDTIME . APPOINTMENT REQUIRED FOR FUTURE REFILLS  Rosuvastatin Calcium (CRESTOR PO) Take by mouth.       Semaglutide, 1 MG/DOSE, (OZEMPIC, 1 MG/DOSE,) 2 MG/1.5ML SOPN Inject into the skin.     SERTRALINE HCL PO Take by mouth.       SPIRONOLACTONE PO Take by mouth.       TERAZOSIN HCL PO Take by mouth.       TRUEPLUS LANCETS 33G MISC   5   TRUETRACK TEST test strip   4   WARFARIN SODIUM PO Take by mouth.       Zinc Sulfate (ZINC 15 PO) Take by mouth.       No current facility-administered medications on file prior to visit.   Allergies  Allergen Reactions   Codeine Nausea And Vomiting   Metformin Hcl Diarrhea    No  results found for this or any previous visit (from the past 2160 hour(s)).  Objective: General: Patient is awake, alert, and oriented x 3 and in no acute distress.  Integument: Skin is warm, dry and supple bilateral. Nails are tender, mildly elongated, thickened and dystrophic with subungual debris, consistent with onychomycosis.  No open lesions or preulcerative lesions present bilateral,  Remaining integument unremarkable.  Vasculature:  Dorsalis Pedis pulse 1/4 bilateral. Posterior Tibial pulse  0/4 bilateral. Capillary fill time <3 sec 1-5 bilateral. Diminished hair growth to the level of the digits.Temperature gradient within normal limits. No varicosities present bilateral.  Trace chronic edema present bilateral with Ace wrap in place on the right lower extremity.  Neurology: The patient has intact sensation measured with a 5.07/10g Semmes Weinstein Monofilament at all pedal sites bilateral.  Unchanged from prior.  Musculoskeletal: Asymptomatic hammertoe pedal deformities noted bilateral. Muscular strength 5/5 in all lower extremity muscular groups bilateral without pain on range of motion. No tenderness with calf compression bilateral.  Assessment and Plan: Problem List Items Addressed This Visit   None Visit Diagnoses     Pain due to onychomycosis of toenail    -  Primary   Type 2 diabetes, controlled, with neuropathy ([place])       PVD (peripheral vascular disease) ([place])          -Examined patient. -Mechanically debrided all nails toenails using sterile nail nipper and filed with dremel without incident  -Advised patient to continue with PCP follow-up for cellulitis in legs and dressing change on the right -Encourage patient to get a shower protector for his right leg over-the-counter -Patient advised to call the office if any problems or questions arise in the meantime. -Return in 3 months for nail trim.   Landis Martins, DPM

## 2020-07-18 DIAGNOSIS — I83019 Varicose veins of right lower extremity with ulcer of unspecified site: Secondary | ICD-10-CM | POA: Diagnosis not present

## 2020-07-18 DIAGNOSIS — L97919 Non-pressure chronic ulcer of unspecified part of right lower leg with unspecified severity: Secondary | ICD-10-CM | POA: Diagnosis not present

## 2020-07-18 DIAGNOSIS — I70213 Atherosclerosis of native arteries of extremities with intermittent claudication, bilateral legs: Secondary | ICD-10-CM | POA: Diagnosis not present

## 2020-07-18 DIAGNOSIS — I83029 Varicose veins of left lower extremity with ulcer of unspecified site: Secondary | ICD-10-CM | POA: Diagnosis not present

## 2020-07-23 DIAGNOSIS — E291 Testicular hypofunction: Secondary | ICD-10-CM | POA: Diagnosis not present

## 2020-07-23 DIAGNOSIS — E538 Deficiency of other specified B group vitamins: Secondary | ICD-10-CM | POA: Diagnosis not present

## 2020-07-23 DIAGNOSIS — E236 Other disorders of pituitary gland: Secondary | ICD-10-CM | POA: Diagnosis not present

## 2020-07-25 DIAGNOSIS — I5030 Unspecified diastolic (congestive) heart failure: Secondary | ICD-10-CM | POA: Diagnosis not present

## 2020-07-25 DIAGNOSIS — K219 Gastro-esophageal reflux disease without esophagitis: Secondary | ICD-10-CM | POA: Diagnosis not present

## 2020-07-25 DIAGNOSIS — E1159 Type 2 diabetes mellitus with other circulatory complications: Secondary | ICD-10-CM | POA: Diagnosis not present

## 2020-07-25 DIAGNOSIS — I110 Hypertensive heart disease with heart failure: Secondary | ICD-10-CM | POA: Diagnosis not present

## 2020-08-06 DIAGNOSIS — E291 Testicular hypofunction: Secondary | ICD-10-CM | POA: Diagnosis not present

## 2020-08-06 DIAGNOSIS — E538 Deficiency of other specified B group vitamins: Secondary | ICD-10-CM | POA: Diagnosis not present

## 2020-08-19 DIAGNOSIS — R0602 Shortness of breath: Secondary | ICD-10-CM | POA: Diagnosis not present

## 2020-08-19 DIAGNOSIS — R0902 Hypoxemia: Secondary | ICD-10-CM | POA: Diagnosis not present

## 2020-08-19 DIAGNOSIS — I517 Cardiomegaly: Secondary | ICD-10-CM | POA: Diagnosis not present

## 2020-08-19 DIAGNOSIS — U071 COVID-19: Secondary | ICD-10-CM | POA: Diagnosis not present

## 2020-08-19 DIAGNOSIS — A419 Sepsis, unspecified organism: Secondary | ICD-10-CM | POA: Diagnosis not present

## 2020-08-19 DIAGNOSIS — A4189 Other specified sepsis: Secondary | ICD-10-CM | POA: Diagnosis not present

## 2020-08-19 DIAGNOSIS — I700 Atherosclerosis of aorta: Secondary | ICD-10-CM | POA: Diagnosis not present

## 2020-08-19 DIAGNOSIS — Z8673 Personal history of transient ischemic attack (TIA), and cerebral infarction without residual deficits: Secondary | ICD-10-CM | POA: Diagnosis not present

## 2020-08-19 DIAGNOSIS — R4182 Altered mental status, unspecified: Secondary | ICD-10-CM | POA: Diagnosis not present

## 2020-08-19 DIAGNOSIS — J439 Emphysema, unspecified: Secondary | ICD-10-CM | POA: Diagnosis not present

## 2020-08-19 DIAGNOSIS — R0989 Other specified symptoms and signs involving the circulatory and respiratory systems: Secondary | ICD-10-CM | POA: Diagnosis not present

## 2020-08-19 DIAGNOSIS — D320 Benign neoplasm of cerebral meninges: Secondary | ICD-10-CM | POA: Diagnosis not present

## 2020-08-19 DIAGNOSIS — I5032 Chronic diastolic (congestive) heart failure: Secondary | ICD-10-CM | POA: Diagnosis not present

## 2020-08-19 DIAGNOSIS — E222 Syndrome of inappropriate secretion of antidiuretic hormone: Secondary | ICD-10-CM | POA: Diagnosis not present

## 2020-08-19 DIAGNOSIS — J1282 Pneumonia due to coronavirus disease 2019: Secondary | ICD-10-CM | POA: Diagnosis not present

## 2020-08-19 DIAGNOSIS — I6782 Cerebral ischemia: Secondary | ICD-10-CM | POA: Diagnosis not present

## 2020-08-20 DIAGNOSIS — F419 Anxiety disorder, unspecified: Secondary | ICD-10-CM | POA: Diagnosis not present

## 2020-08-20 DIAGNOSIS — E871 Hypo-osmolality and hyponatremia: Secondary | ICD-10-CM | POA: Diagnosis not present

## 2020-08-20 DIAGNOSIS — R531 Weakness: Secondary | ICD-10-CM | POA: Diagnosis not present

## 2020-08-20 DIAGNOSIS — F339 Major depressive disorder, recurrent, unspecified: Secondary | ICD-10-CM | POA: Diagnosis not present

## 2020-08-20 DIAGNOSIS — M6281 Muscle weakness (generalized): Secondary | ICD-10-CM | POA: Diagnosis not present

## 2020-08-20 DIAGNOSIS — Z794 Long term (current) use of insulin: Secondary | ICD-10-CM | POA: Diagnosis not present

## 2020-08-20 DIAGNOSIS — E785 Hyperlipidemia, unspecified: Secondary | ICD-10-CM | POA: Diagnosis present

## 2020-08-20 DIAGNOSIS — R2681 Unsteadiness on feet: Secondary | ICD-10-CM | POA: Diagnosis present

## 2020-08-20 DIAGNOSIS — I69298 Other sequelae of other nontraumatic intracranial hemorrhage: Secondary | ICD-10-CM | POA: Diagnosis not present

## 2020-08-20 DIAGNOSIS — K219 Gastro-esophageal reflux disease without esophagitis: Secondary | ICD-10-CM | POA: Diagnosis present

## 2020-08-20 DIAGNOSIS — E222 Syndrome of inappropriate secretion of antidiuretic hormone: Secondary | ICD-10-CM | POA: Diagnosis present

## 2020-08-20 DIAGNOSIS — E039 Hypothyroidism, unspecified: Secondary | ICD-10-CM | POA: Diagnosis present

## 2020-08-20 DIAGNOSIS — N400 Enlarged prostate without lower urinary tract symptoms: Secondary | ICD-10-CM | POA: Diagnosis not present

## 2020-08-20 DIAGNOSIS — I872 Venous insufficiency (chronic) (peripheral): Secondary | ICD-10-CM | POA: Diagnosis present

## 2020-08-20 DIAGNOSIS — A4189 Other specified sepsis: Secondary | ICD-10-CM | POA: Diagnosis present

## 2020-08-20 DIAGNOSIS — F32A Depression, unspecified: Secondary | ICD-10-CM | POA: Diagnosis present

## 2020-08-20 DIAGNOSIS — R5383 Other fatigue: Secondary | ICD-10-CM | POA: Diagnosis not present

## 2020-08-20 DIAGNOSIS — D72829 Elevated white blood cell count, unspecified: Secondary | ICD-10-CM | POA: Diagnosis not present

## 2020-08-20 DIAGNOSIS — I252 Old myocardial infarction: Secondary | ICD-10-CM | POA: Diagnosis not present

## 2020-08-20 DIAGNOSIS — G40909 Epilepsy, unspecified, not intractable, without status epilepticus: Secondary | ICD-10-CM | POA: Diagnosis present

## 2020-08-20 DIAGNOSIS — E119 Type 2 diabetes mellitus without complications: Secondary | ICD-10-CM | POA: Diagnosis present

## 2020-08-20 DIAGNOSIS — N401 Enlarged prostate with lower urinary tract symptoms: Secondary | ICD-10-CM | POA: Diagnosis present

## 2020-08-20 DIAGNOSIS — G4089 Other seizures: Secondary | ICD-10-CM | POA: Diagnosis not present

## 2020-08-20 DIAGNOSIS — I110 Hypertensive heart disease with heart failure: Secondary | ICD-10-CM | POA: Diagnosis present

## 2020-08-20 DIAGNOSIS — E1159 Type 2 diabetes mellitus with other circulatory complications: Secondary | ICD-10-CM | POA: Diagnosis not present

## 2020-08-20 DIAGNOSIS — I5032 Chronic diastolic (congestive) heart failure: Secondary | ICD-10-CM | POA: Diagnosis present

## 2020-08-20 DIAGNOSIS — R2689 Other abnormalities of gait and mobility: Secondary | ICD-10-CM | POA: Diagnosis not present

## 2020-08-20 DIAGNOSIS — G4733 Obstructive sleep apnea (adult) (pediatric): Secondary | ICD-10-CM | POA: Diagnosis present

## 2020-08-20 DIAGNOSIS — Z8616 Personal history of COVID-19: Secondary | ICD-10-CM | POA: Diagnosis not present

## 2020-08-20 DIAGNOSIS — J439 Emphysema, unspecified: Secondary | ICD-10-CM | POA: Diagnosis present

## 2020-08-20 DIAGNOSIS — I2510 Atherosclerotic heart disease of native coronary artery without angina pectoris: Secondary | ICD-10-CM | POA: Diagnosis present

## 2020-08-20 DIAGNOSIS — Z951 Presence of aortocoronary bypass graft: Secondary | ICD-10-CM | POA: Diagnosis not present

## 2020-08-20 DIAGNOSIS — E876 Hypokalemia: Secondary | ICD-10-CM | POA: Diagnosis not present

## 2020-08-20 DIAGNOSIS — I480 Paroxysmal atrial fibrillation: Secondary | ICD-10-CM | POA: Diagnosis present

## 2020-08-20 DIAGNOSIS — I10 Essential (primary) hypertension: Secondary | ICD-10-CM | POA: Diagnosis not present

## 2020-08-20 DIAGNOSIS — M109 Gout, unspecified: Secondary | ICD-10-CM | POA: Diagnosis present

## 2020-08-20 DIAGNOSIS — U071 COVID-19: Secondary | ICD-10-CM | POA: Diagnosis present

## 2020-08-20 DIAGNOSIS — D696 Thrombocytopenia, unspecified: Secondary | ICD-10-CM | POA: Diagnosis present

## 2020-08-20 DIAGNOSIS — I5030 Unspecified diastolic (congestive) heart failure: Secondary | ICD-10-CM | POA: Diagnosis not present

## 2020-08-20 DIAGNOSIS — J449 Chronic obstructive pulmonary disease, unspecified: Secondary | ICD-10-CM | POA: Diagnosis not present

## 2020-08-20 DIAGNOSIS — R0902 Hypoxemia: Secondary | ICD-10-CM | POA: Diagnosis present

## 2020-08-25 DIAGNOSIS — K219 Gastro-esophageal reflux disease without esophagitis: Secondary | ICD-10-CM | POA: Diagnosis not present

## 2020-08-25 DIAGNOSIS — I5030 Unspecified diastolic (congestive) heart failure: Secondary | ICD-10-CM | POA: Diagnosis not present

## 2020-08-25 DIAGNOSIS — E1159 Type 2 diabetes mellitus with other circulatory complications: Secondary | ICD-10-CM | POA: Diagnosis not present

## 2020-08-25 DIAGNOSIS — I110 Hypertensive heart disease with heart failure: Secondary | ICD-10-CM | POA: Diagnosis not present

## 2020-08-30 DIAGNOSIS — D72829 Elevated white blood cell count, unspecified: Secondary | ICD-10-CM | POA: Diagnosis not present

## 2020-08-30 DIAGNOSIS — K219 Gastro-esophageal reflux disease without esophagitis: Secondary | ICD-10-CM | POA: Diagnosis not present

## 2020-08-30 DIAGNOSIS — E039 Hypothyroidism, unspecified: Secondary | ICD-10-CM | POA: Diagnosis not present

## 2020-08-30 DIAGNOSIS — R262 Difficulty in walking, not elsewhere classified: Secondary | ICD-10-CM | POA: Diagnosis not present

## 2020-08-30 DIAGNOSIS — R2689 Other abnormalities of gait and mobility: Secondary | ICD-10-CM | POA: Diagnosis not present

## 2020-08-30 DIAGNOSIS — F339 Major depressive disorder, recurrent, unspecified: Secondary | ICD-10-CM | POA: Diagnosis not present

## 2020-08-30 DIAGNOSIS — A4189 Other specified sepsis: Secondary | ICD-10-CM | POA: Diagnosis not present

## 2020-08-30 DIAGNOSIS — U071 COVID-19: Secondary | ICD-10-CM | POA: Diagnosis not present

## 2020-08-30 DIAGNOSIS — E1151 Type 2 diabetes mellitus with diabetic peripheral angiopathy without gangrene: Secondary | ICD-10-CM | POA: Diagnosis not present

## 2020-08-30 DIAGNOSIS — F419 Anxiety disorder, unspecified: Secondary | ICD-10-CM | POA: Diagnosis not present

## 2020-08-30 DIAGNOSIS — I480 Paroxysmal atrial fibrillation: Secondary | ICD-10-CM | POA: Diagnosis not present

## 2020-08-30 DIAGNOSIS — G4089 Other seizures: Secondary | ICD-10-CM | POA: Diagnosis not present

## 2020-08-30 DIAGNOSIS — J449 Chronic obstructive pulmonary disease, unspecified: Secondary | ICD-10-CM | POA: Diagnosis not present

## 2020-08-30 DIAGNOSIS — D696 Thrombocytopenia, unspecified: Secondary | ICD-10-CM | POA: Diagnosis not present

## 2020-08-30 DIAGNOSIS — I10 Essential (primary) hypertension: Secondary | ICD-10-CM | POA: Diagnosis not present

## 2020-08-30 DIAGNOSIS — Z8616 Personal history of COVID-19: Secondary | ICD-10-CM | POA: Diagnosis not present

## 2020-08-30 DIAGNOSIS — E871 Hypo-osmolality and hyponatremia: Secondary | ICD-10-CM | POA: Diagnosis not present

## 2020-08-30 DIAGNOSIS — J439 Emphysema, unspecified: Secondary | ICD-10-CM | POA: Diagnosis not present

## 2020-08-30 DIAGNOSIS — M109 Gout, unspecified: Secondary | ICD-10-CM | POA: Diagnosis not present

## 2020-08-30 DIAGNOSIS — I4891 Unspecified atrial fibrillation: Secondary | ICD-10-CM | POA: Diagnosis not present

## 2020-08-30 DIAGNOSIS — M6281 Muscle weakness (generalized): Secondary | ICD-10-CM | POA: Diagnosis not present

## 2020-08-30 DIAGNOSIS — E119 Type 2 diabetes mellitus without complications: Secondary | ICD-10-CM | POA: Diagnosis not present

## 2020-08-30 DIAGNOSIS — R2681 Unsteadiness on feet: Secondary | ICD-10-CM | POA: Diagnosis not present

## 2020-08-30 DIAGNOSIS — I2510 Atherosclerotic heart disease of native coronary artery without angina pectoris: Secondary | ICD-10-CM | POA: Diagnosis not present

## 2020-08-30 DIAGNOSIS — E222 Syndrome of inappropriate secretion of antidiuretic hormone: Secondary | ICD-10-CM | POA: Diagnosis not present

## 2020-08-30 DIAGNOSIS — N400 Enlarged prostate without lower urinary tract symptoms: Secondary | ICD-10-CM | POA: Diagnosis not present

## 2020-09-02 DIAGNOSIS — R262 Difficulty in walking, not elsewhere classified: Secondary | ICD-10-CM | POA: Diagnosis not present

## 2020-09-02 DIAGNOSIS — I2510 Atherosclerotic heart disease of native coronary artery without angina pectoris: Secondary | ICD-10-CM | POA: Diagnosis not present

## 2020-09-02 DIAGNOSIS — E1151 Type 2 diabetes mellitus with diabetic peripheral angiopathy without gangrene: Secondary | ICD-10-CM | POA: Diagnosis not present

## 2020-09-02 DIAGNOSIS — I4891 Unspecified atrial fibrillation: Secondary | ICD-10-CM | POA: Diagnosis not present

## 2020-09-17 DIAGNOSIS — N400 Enlarged prostate without lower urinary tract symptoms: Secondary | ICD-10-CM | POA: Diagnosis not present

## 2020-09-18 DIAGNOSIS — I25119 Atherosclerotic heart disease of native coronary artery with unspecified angina pectoris: Secondary | ICD-10-CM | POA: Diagnosis not present

## 2020-09-18 DIAGNOSIS — E1159 Type 2 diabetes mellitus with other circulatory complications: Secondary | ICD-10-CM | POA: Diagnosis not present

## 2020-09-18 DIAGNOSIS — Z794 Long term (current) use of insulin: Secondary | ICD-10-CM | POA: Diagnosis not present

## 2020-09-18 DIAGNOSIS — U071 COVID-19: Secondary | ICD-10-CM | POA: Diagnosis not present

## 2020-09-25 DIAGNOSIS — E871 Hypo-osmolality and hyponatremia: Secondary | ICD-10-CM | POA: Diagnosis not present

## 2020-09-25 DIAGNOSIS — A419 Sepsis, unspecified organism: Secondary | ICD-10-CM | POA: Diagnosis not present

## 2020-09-25 DIAGNOSIS — U071 COVID-19: Secondary | ICD-10-CM | POA: Diagnosis not present

## 2020-09-25 DIAGNOSIS — J45909 Unspecified asthma, uncomplicated: Secondary | ICD-10-CM | POA: Diagnosis not present

## 2020-09-25 DIAGNOSIS — R2689 Other abnormalities of gait and mobility: Secondary | ICD-10-CM | POA: Diagnosis not present

## 2020-09-25 DIAGNOSIS — I872 Venous insufficiency (chronic) (peripheral): Secondary | ICD-10-CM | POA: Diagnosis not present

## 2020-09-25 DIAGNOSIS — I70213 Atherosclerosis of native arteries of extremities with intermittent claudication, bilateral legs: Secondary | ICD-10-CM | POA: Diagnosis not present

## 2020-09-25 DIAGNOSIS — M5187 Other intervertebral disc disorders, lumbosacral region: Secondary | ICD-10-CM | POA: Diagnosis not present

## 2020-09-25 DIAGNOSIS — N401 Enlarged prostate with lower urinary tract symptoms: Secondary | ICD-10-CM | POA: Diagnosis not present

## 2020-09-25 DIAGNOSIS — G40909 Epilepsy, unspecified, not intractable, without status epilepticus: Secondary | ICD-10-CM | POA: Diagnosis not present

## 2020-09-25 DIAGNOSIS — E230 Hypopituitarism: Secondary | ICD-10-CM | POA: Diagnosis not present

## 2020-09-25 DIAGNOSIS — J439 Emphysema, unspecified: Secondary | ICD-10-CM | POA: Diagnosis not present

## 2020-09-25 DIAGNOSIS — I69198 Other sequelae of nontraumatic intracerebral hemorrhage: Secondary | ICD-10-CM | POA: Diagnosis not present

## 2020-09-25 DIAGNOSIS — K2960 Other gastritis without bleeding: Secondary | ICD-10-CM | POA: Diagnosis not present

## 2020-09-25 DIAGNOSIS — D6869 Other thrombophilia: Secondary | ICD-10-CM | POA: Diagnosis not present

## 2020-09-25 DIAGNOSIS — I110 Hypertensive heart disease with heart failure: Secondary | ICD-10-CM | POA: Diagnosis not present

## 2020-09-25 DIAGNOSIS — I252 Old myocardial infarction: Secondary | ICD-10-CM | POA: Diagnosis not present

## 2020-09-25 DIAGNOSIS — E222 Syndrome of inappropriate secretion of antidiuretic hormone: Secondary | ICD-10-CM | POA: Diagnosis not present

## 2020-09-25 DIAGNOSIS — I480 Paroxysmal atrial fibrillation: Secondary | ICD-10-CM | POA: Diagnosis not present

## 2020-09-25 DIAGNOSIS — I5032 Chronic diastolic (congestive) heart failure: Secondary | ICD-10-CM | POA: Diagnosis not present

## 2020-09-25 DIAGNOSIS — D320 Benign neoplasm of cerebral meninges: Secondary | ICD-10-CM | POA: Diagnosis not present

## 2020-09-25 DIAGNOSIS — I25119 Atherosclerotic heart disease of native coronary artery with unspecified angina pectoris: Secondary | ICD-10-CM | POA: Diagnosis not present

## 2020-09-25 DIAGNOSIS — G2581 Restless legs syndrome: Secondary | ICD-10-CM | POA: Diagnosis not present

## 2020-09-25 DIAGNOSIS — E1151 Type 2 diabetes mellitus with diabetic peripheral angiopathy without gangrene: Secondary | ICD-10-CM | POA: Diagnosis not present

## 2020-09-25 DIAGNOSIS — I255 Ischemic cardiomyopathy: Secondary | ICD-10-CM | POA: Diagnosis not present

## 2020-09-26 DIAGNOSIS — U071 COVID-19: Secondary | ICD-10-CM | POA: Diagnosis not present

## 2020-09-26 DIAGNOSIS — E871 Hypo-osmolality and hyponatremia: Secondary | ICD-10-CM | POA: Diagnosis not present

## 2020-09-26 DIAGNOSIS — J439 Emphysema, unspecified: Secondary | ICD-10-CM | POA: Diagnosis not present

## 2020-09-26 DIAGNOSIS — E1151 Type 2 diabetes mellitus with diabetic peripheral angiopathy without gangrene: Secondary | ICD-10-CM | POA: Diagnosis not present

## 2020-09-26 DIAGNOSIS — J45909 Unspecified asthma, uncomplicated: Secondary | ICD-10-CM | POA: Diagnosis not present

## 2020-09-26 DIAGNOSIS — A419 Sepsis, unspecified organism: Secondary | ICD-10-CM | POA: Diagnosis not present

## 2020-09-30 DIAGNOSIS — E1151 Type 2 diabetes mellitus with diabetic peripheral angiopathy without gangrene: Secondary | ICD-10-CM | POA: Diagnosis not present

## 2020-09-30 DIAGNOSIS — A419 Sepsis, unspecified organism: Secondary | ICD-10-CM | POA: Diagnosis not present

## 2020-09-30 DIAGNOSIS — J45909 Unspecified asthma, uncomplicated: Secondary | ICD-10-CM | POA: Diagnosis not present

## 2020-09-30 DIAGNOSIS — U071 COVID-19: Secondary | ICD-10-CM | POA: Diagnosis not present

## 2020-09-30 DIAGNOSIS — E871 Hypo-osmolality and hyponatremia: Secondary | ICD-10-CM | POA: Diagnosis not present

## 2020-09-30 DIAGNOSIS — J439 Emphysema, unspecified: Secondary | ICD-10-CM | POA: Diagnosis not present

## 2020-10-02 DIAGNOSIS — A411 Sepsis due to other specified staphylococcus: Secondary | ICD-10-CM | POA: Diagnosis not present

## 2020-10-02 DIAGNOSIS — U071 COVID-19: Secondary | ICD-10-CM | POA: Diagnosis not present

## 2020-10-03 DIAGNOSIS — U071 COVID-19: Secondary | ICD-10-CM | POA: Diagnosis not present

## 2020-10-03 DIAGNOSIS — A419 Sepsis, unspecified organism: Secondary | ICD-10-CM | POA: Diagnosis not present

## 2020-10-03 DIAGNOSIS — E1151 Type 2 diabetes mellitus with diabetic peripheral angiopathy without gangrene: Secondary | ICD-10-CM | POA: Diagnosis not present

## 2020-10-03 DIAGNOSIS — E871 Hypo-osmolality and hyponatremia: Secondary | ICD-10-CM | POA: Diagnosis not present

## 2020-10-03 DIAGNOSIS — J439 Emphysema, unspecified: Secondary | ICD-10-CM | POA: Diagnosis not present

## 2020-10-03 DIAGNOSIS — J45909 Unspecified asthma, uncomplicated: Secondary | ICD-10-CM | POA: Diagnosis not present

## 2020-10-07 DIAGNOSIS — E1151 Type 2 diabetes mellitus with diabetic peripheral angiopathy without gangrene: Secondary | ICD-10-CM | POA: Diagnosis not present

## 2020-10-07 DIAGNOSIS — A419 Sepsis, unspecified organism: Secondary | ICD-10-CM | POA: Diagnosis not present

## 2020-10-07 DIAGNOSIS — J45909 Unspecified asthma, uncomplicated: Secondary | ICD-10-CM | POA: Diagnosis not present

## 2020-10-07 DIAGNOSIS — J439 Emphysema, unspecified: Secondary | ICD-10-CM | POA: Diagnosis not present

## 2020-10-07 DIAGNOSIS — U071 COVID-19: Secondary | ICD-10-CM | POA: Diagnosis not present

## 2020-10-07 DIAGNOSIS — E871 Hypo-osmolality and hyponatremia: Secondary | ICD-10-CM | POA: Diagnosis not present

## 2020-10-08 DIAGNOSIS — D320 Benign neoplasm of cerebral meninges: Secondary | ICD-10-CM | POA: Diagnosis not present

## 2020-10-08 DIAGNOSIS — Z9981 Dependence on supplemental oxygen: Secondary | ICD-10-CM | POA: Diagnosis not present

## 2020-10-08 DIAGNOSIS — J9611 Chronic respiratory failure with hypoxia: Secondary | ICD-10-CM | POA: Diagnosis not present

## 2020-10-08 DIAGNOSIS — I5030 Unspecified diastolic (congestive) heart failure: Secondary | ICD-10-CM | POA: Diagnosis not present

## 2020-10-09 DIAGNOSIS — Z1152 Encounter for screening for COVID-19: Secondary | ICD-10-CM | POA: Diagnosis not present

## 2020-10-11 DIAGNOSIS — E039 Hypothyroidism, unspecified: Secondary | ICD-10-CM | POA: Diagnosis not present

## 2020-10-11 DIAGNOSIS — Z9181 History of falling: Secondary | ICD-10-CM | POA: Diagnosis not present

## 2020-10-11 DIAGNOSIS — R634 Abnormal weight loss: Secondary | ICD-10-CM | POA: Diagnosis not present

## 2020-10-11 DIAGNOSIS — F32A Depression, unspecified: Secondary | ICD-10-CM | POA: Diagnosis not present

## 2020-10-11 DIAGNOSIS — I2510 Atherosclerotic heart disease of native coronary artery without angina pectoris: Secondary | ICD-10-CM | POA: Diagnosis not present

## 2020-10-11 DIAGNOSIS — I872 Venous insufficiency (chronic) (peripheral): Secondary | ICD-10-CM | POA: Diagnosis not present

## 2020-10-11 DIAGNOSIS — G40909 Epilepsy, unspecified, not intractable, without status epilepticus: Secondary | ICD-10-CM | POA: Diagnosis not present

## 2020-10-11 DIAGNOSIS — E119 Type 2 diabetes mellitus without complications: Secondary | ICD-10-CM | POA: Diagnosis not present

## 2020-10-11 DIAGNOSIS — I5032 Chronic diastolic (congestive) heart failure: Secondary | ICD-10-CM | POA: Diagnosis not present

## 2020-10-11 DIAGNOSIS — G4733 Obstructive sleep apnea (adult) (pediatric): Secondary | ICD-10-CM | POA: Diagnosis not present

## 2020-10-11 DIAGNOSIS — I110 Hypertensive heart disease with heart failure: Secondary | ICD-10-CM | POA: Diagnosis not present

## 2020-10-11 DIAGNOSIS — Z8701 Personal history of pneumonia (recurrent): Secondary | ICD-10-CM | POA: Diagnosis not present

## 2020-10-11 DIAGNOSIS — M109 Gout, unspecified: Secondary | ICD-10-CM | POA: Diagnosis not present

## 2020-10-11 DIAGNOSIS — D320 Benign neoplasm of cerebral meninges: Secondary | ICD-10-CM | POA: Diagnosis not present

## 2020-10-11 DIAGNOSIS — E230 Hypopituitarism: Secondary | ICD-10-CM | POA: Diagnosis not present

## 2020-10-11 DIAGNOSIS — Z8679 Personal history of other diseases of the circulatory system: Secondary | ICD-10-CM | POA: Diagnosis not present

## 2020-10-11 DIAGNOSIS — N400 Enlarged prostate without lower urinary tract symptoms: Secondary | ICD-10-CM | POA: Diagnosis not present

## 2020-10-11 DIAGNOSIS — I4891 Unspecified atrial fibrillation: Secondary | ICD-10-CM | POA: Diagnosis not present

## 2020-10-11 DIAGNOSIS — G2581 Restless legs syndrome: Secondary | ICD-10-CM | POA: Diagnosis not present

## 2020-10-11 DIAGNOSIS — Z8616 Personal history of COVID-19: Secondary | ICD-10-CM | POA: Diagnosis not present

## 2020-10-13 DIAGNOSIS — Z9181 History of falling: Secondary | ICD-10-CM | POA: Diagnosis not present

## 2020-10-13 DIAGNOSIS — D320 Benign neoplasm of cerebral meninges: Secondary | ICD-10-CM | POA: Diagnosis not present

## 2020-10-13 DIAGNOSIS — Z8616 Personal history of COVID-19: Secondary | ICD-10-CM | POA: Diagnosis not present

## 2020-10-13 DIAGNOSIS — Z8679 Personal history of other diseases of the circulatory system: Secondary | ICD-10-CM | POA: Diagnosis not present

## 2020-10-13 DIAGNOSIS — G40909 Epilepsy, unspecified, not intractable, without status epilepticus: Secondary | ICD-10-CM | POA: Diagnosis not present

## 2020-10-13 DIAGNOSIS — R634 Abnormal weight loss: Secondary | ICD-10-CM | POA: Diagnosis not present

## 2020-10-14 DIAGNOSIS — Z9181 History of falling: Secondary | ICD-10-CM | POA: Diagnosis not present

## 2020-10-14 DIAGNOSIS — D320 Benign neoplasm of cerebral meninges: Secondary | ICD-10-CM | POA: Diagnosis not present

## 2020-10-14 DIAGNOSIS — Z8679 Personal history of other diseases of the circulatory system: Secondary | ICD-10-CM | POA: Diagnosis not present

## 2020-10-14 DIAGNOSIS — R634 Abnormal weight loss: Secondary | ICD-10-CM | POA: Diagnosis not present

## 2020-10-14 DIAGNOSIS — Z8616 Personal history of COVID-19: Secondary | ICD-10-CM | POA: Diagnosis not present

## 2020-10-14 DIAGNOSIS — G40909 Epilepsy, unspecified, not intractable, without status epilepticus: Secondary | ICD-10-CM | POA: Diagnosis not present

## 2020-10-15 DIAGNOSIS — Z8679 Personal history of other diseases of the circulatory system: Secondary | ICD-10-CM | POA: Diagnosis not present

## 2020-10-15 DIAGNOSIS — G40909 Epilepsy, unspecified, not intractable, without status epilepticus: Secondary | ICD-10-CM | POA: Diagnosis not present

## 2020-10-15 DIAGNOSIS — D320 Benign neoplasm of cerebral meninges: Secondary | ICD-10-CM | POA: Diagnosis not present

## 2020-10-15 DIAGNOSIS — Z9181 History of falling: Secondary | ICD-10-CM | POA: Diagnosis not present

## 2020-10-15 DIAGNOSIS — Z8616 Personal history of COVID-19: Secondary | ICD-10-CM | POA: Diagnosis not present

## 2020-10-15 DIAGNOSIS — R634 Abnormal weight loss: Secondary | ICD-10-CM | POA: Diagnosis not present

## 2020-10-16 ENCOUNTER — Ambulatory Visit: Payer: Medicare Other | Admitting: Sports Medicine

## 2020-10-16 DIAGNOSIS — G40909 Epilepsy, unspecified, not intractable, without status epilepticus: Secondary | ICD-10-CM | POA: Diagnosis not present

## 2020-10-16 DIAGNOSIS — Z9181 History of falling: Secondary | ICD-10-CM | POA: Diagnosis not present

## 2020-10-16 DIAGNOSIS — R634 Abnormal weight loss: Secondary | ICD-10-CM | POA: Diagnosis not present

## 2020-10-16 DIAGNOSIS — Z8679 Personal history of other diseases of the circulatory system: Secondary | ICD-10-CM | POA: Diagnosis not present

## 2020-10-16 DIAGNOSIS — D320 Benign neoplasm of cerebral meninges: Secondary | ICD-10-CM | POA: Diagnosis not present

## 2020-10-16 DIAGNOSIS — Z8616 Personal history of COVID-19: Secondary | ICD-10-CM | POA: Diagnosis not present

## 2020-10-18 DIAGNOSIS — Z9181 History of falling: Secondary | ICD-10-CM | POA: Diagnosis not present

## 2020-10-18 DIAGNOSIS — D320 Benign neoplasm of cerebral meninges: Secondary | ICD-10-CM | POA: Diagnosis not present

## 2020-10-18 DIAGNOSIS — R634 Abnormal weight loss: Secondary | ICD-10-CM | POA: Diagnosis not present

## 2020-10-18 DIAGNOSIS — Z8679 Personal history of other diseases of the circulatory system: Secondary | ICD-10-CM | POA: Diagnosis not present

## 2020-10-18 DIAGNOSIS — Z8616 Personal history of COVID-19: Secondary | ICD-10-CM | POA: Diagnosis not present

## 2020-10-18 DIAGNOSIS — G40909 Epilepsy, unspecified, not intractable, without status epilepticus: Secondary | ICD-10-CM | POA: Diagnosis not present

## 2020-10-21 DIAGNOSIS — G40909 Epilepsy, unspecified, not intractable, without status epilepticus: Secondary | ICD-10-CM | POA: Diagnosis not present

## 2020-10-21 DIAGNOSIS — D320 Benign neoplasm of cerebral meninges: Secondary | ICD-10-CM | POA: Diagnosis not present

## 2020-10-21 DIAGNOSIS — Z8616 Personal history of COVID-19: Secondary | ICD-10-CM | POA: Diagnosis not present

## 2020-10-21 DIAGNOSIS — Z8679 Personal history of other diseases of the circulatory system: Secondary | ICD-10-CM | POA: Diagnosis not present

## 2020-10-21 DIAGNOSIS — R634 Abnormal weight loss: Secondary | ICD-10-CM | POA: Diagnosis not present

## 2020-10-21 DIAGNOSIS — U071 COVID-19: Secondary | ICD-10-CM | POA: Diagnosis not present

## 2020-10-21 DIAGNOSIS — I4891 Unspecified atrial fibrillation: Secondary | ICD-10-CM | POA: Diagnosis not present

## 2020-10-21 DIAGNOSIS — Z9181 History of falling: Secondary | ICD-10-CM | POA: Diagnosis not present

## 2020-10-21 DIAGNOSIS — D6869 Other thrombophilia: Secondary | ICD-10-CM | POA: Diagnosis not present

## 2020-10-22 DIAGNOSIS — G40909 Epilepsy, unspecified, not intractable, without status epilepticus: Secondary | ICD-10-CM | POA: Diagnosis not present

## 2020-10-22 DIAGNOSIS — D320 Benign neoplasm of cerebral meninges: Secondary | ICD-10-CM | POA: Diagnosis not present

## 2020-10-22 DIAGNOSIS — Z9181 History of falling: Secondary | ICD-10-CM | POA: Diagnosis not present

## 2020-10-22 DIAGNOSIS — Z8679 Personal history of other diseases of the circulatory system: Secondary | ICD-10-CM | POA: Diagnosis not present

## 2020-10-22 DIAGNOSIS — R634 Abnormal weight loss: Secondary | ICD-10-CM | POA: Diagnosis not present

## 2020-10-22 DIAGNOSIS — Z8616 Personal history of COVID-19: Secondary | ICD-10-CM | POA: Diagnosis not present

## 2020-10-23 DIAGNOSIS — Z8616 Personal history of COVID-19: Secondary | ICD-10-CM | POA: Diagnosis not present

## 2020-10-23 DIAGNOSIS — D320 Benign neoplasm of cerebral meninges: Secondary | ICD-10-CM | POA: Diagnosis not present

## 2020-10-23 DIAGNOSIS — G40909 Epilepsy, unspecified, not intractable, without status epilepticus: Secondary | ICD-10-CM | POA: Diagnosis not present

## 2020-10-23 DIAGNOSIS — Z8679 Personal history of other diseases of the circulatory system: Secondary | ICD-10-CM | POA: Diagnosis not present

## 2020-10-23 DIAGNOSIS — Z9181 History of falling: Secondary | ICD-10-CM | POA: Diagnosis not present

## 2020-10-23 DIAGNOSIS — R634 Abnormal weight loss: Secondary | ICD-10-CM | POA: Diagnosis not present

## 2020-10-25 DIAGNOSIS — D320 Benign neoplasm of cerebral meninges: Secondary | ICD-10-CM | POA: Diagnosis not present

## 2020-10-25 DIAGNOSIS — Z8616 Personal history of COVID-19: Secondary | ICD-10-CM | POA: Diagnosis not present

## 2020-10-25 DIAGNOSIS — R634 Abnormal weight loss: Secondary | ICD-10-CM | POA: Diagnosis not present

## 2020-10-25 DIAGNOSIS — Z8679 Personal history of other diseases of the circulatory system: Secondary | ICD-10-CM | POA: Diagnosis not present

## 2020-10-25 DIAGNOSIS — G40909 Epilepsy, unspecified, not intractable, without status epilepticus: Secondary | ICD-10-CM | POA: Diagnosis not present

## 2020-10-25 DIAGNOSIS — Z9181 History of falling: Secondary | ICD-10-CM | POA: Diagnosis not present

## 2020-10-26 DIAGNOSIS — S06320A Contusion and laceration of left cerebrum without loss of consciousness, initial encounter: Secondary | ICD-10-CM | POA: Diagnosis not present

## 2020-10-26 DIAGNOSIS — I872 Venous insufficiency (chronic) (peripheral): Secondary | ICD-10-CM | POA: Diagnosis not present

## 2020-10-26 DIAGNOSIS — I110 Hypertensive heart disease with heart failure: Secondary | ICD-10-CM | POA: Diagnosis not present

## 2020-10-26 DIAGNOSIS — F32A Depression, unspecified: Secondary | ICD-10-CM | POA: Diagnosis not present

## 2020-10-26 DIAGNOSIS — I2510 Atherosclerotic heart disease of native coronary artery without angina pectoris: Secondary | ICD-10-CM | POA: Diagnosis not present

## 2020-10-26 DIAGNOSIS — E039 Hypothyroidism, unspecified: Secondary | ICD-10-CM | POA: Diagnosis not present

## 2020-10-26 DIAGNOSIS — G319 Degenerative disease of nervous system, unspecified: Secondary | ICD-10-CM | POA: Diagnosis not present

## 2020-10-26 DIAGNOSIS — G40909 Epilepsy, unspecified, not intractable, without status epilepticus: Secondary | ICD-10-CM | POA: Diagnosis not present

## 2020-10-26 DIAGNOSIS — I5032 Chronic diastolic (congestive) heart failure: Secondary | ICD-10-CM | POA: Diagnosis not present

## 2020-10-26 DIAGNOSIS — Z8616 Personal history of COVID-19: Secondary | ICD-10-CM | POA: Diagnosis not present

## 2020-10-26 DIAGNOSIS — Z8701 Personal history of pneumonia (recurrent): Secondary | ICD-10-CM | POA: Diagnosis not present

## 2020-10-26 DIAGNOSIS — D320 Benign neoplasm of cerebral meninges: Secondary | ICD-10-CM | POA: Diagnosis not present

## 2020-10-26 DIAGNOSIS — E230 Hypopituitarism: Secondary | ICD-10-CM | POA: Diagnosis not present

## 2020-10-26 DIAGNOSIS — S065X0A Traumatic subdural hemorrhage without loss of consciousness, initial encounter: Secondary | ICD-10-CM | POA: Diagnosis not present

## 2020-10-26 DIAGNOSIS — W19XXXA Unspecified fall, initial encounter: Secondary | ICD-10-CM | POA: Diagnosis not present

## 2020-10-26 DIAGNOSIS — R58 Hemorrhage, not elsewhere classified: Secondary | ICD-10-CM | POA: Diagnosis not present

## 2020-10-26 DIAGNOSIS — I4891 Unspecified atrial fibrillation: Secondary | ICD-10-CM | POA: Diagnosis not present

## 2020-10-26 DIAGNOSIS — S065X9A Traumatic subdural hemorrhage with loss of consciousness of unspecified duration, initial encounter: Secondary | ICD-10-CM | POA: Diagnosis not present

## 2020-10-26 DIAGNOSIS — Z8679 Personal history of other diseases of the circulatory system: Secondary | ICD-10-CM | POA: Diagnosis not present

## 2020-10-26 DIAGNOSIS — N400 Enlarged prostate without lower urinary tract symptoms: Secondary | ICD-10-CM | POA: Diagnosis not present

## 2020-10-26 DIAGNOSIS — R0902 Hypoxemia: Secondary | ICD-10-CM | POA: Diagnosis not present

## 2020-10-26 DIAGNOSIS — R634 Abnormal weight loss: Secondary | ICD-10-CM | POA: Diagnosis not present

## 2020-10-26 DIAGNOSIS — Z9181 History of falling: Secondary | ICD-10-CM | POA: Diagnosis not present

## 2020-10-26 DIAGNOSIS — I6200 Nontraumatic subdural hemorrhage, unspecified: Secondary | ICD-10-CM | POA: Diagnosis not present

## 2020-10-26 DIAGNOSIS — G2581 Restless legs syndrome: Secondary | ICD-10-CM | POA: Diagnosis not present

## 2020-10-26 DIAGNOSIS — G4733 Obstructive sleep apnea (adult) (pediatric): Secondary | ICD-10-CM | POA: Diagnosis not present

## 2020-10-26 DIAGNOSIS — E119 Type 2 diabetes mellitus without complications: Secondary | ICD-10-CM | POA: Diagnosis not present

## 2020-10-26 DIAGNOSIS — M109 Gout, unspecified: Secondary | ICD-10-CM | POA: Diagnosis not present

## 2020-11-09 DIAGNOSIS — Z1152 Encounter for screening for COVID-19: Secondary | ICD-10-CM | POA: Diagnosis not present

## 2020-11-26 DIAGNOSIS — I5032 Chronic diastolic (congestive) heart failure: Secondary | ICD-10-CM | POA: Diagnosis not present

## 2020-11-26 DIAGNOSIS — F32A Depression, unspecified: Secondary | ICD-10-CM | POA: Diagnosis not present

## 2020-11-26 DIAGNOSIS — Z9181 History of falling: Secondary | ICD-10-CM | POA: Diagnosis not present

## 2020-11-26 DIAGNOSIS — E039 Hypothyroidism, unspecified: Secondary | ICD-10-CM | POA: Diagnosis not present

## 2020-11-26 DIAGNOSIS — Z8679 Personal history of other diseases of the circulatory system: Secondary | ICD-10-CM | POA: Diagnosis not present

## 2020-11-26 DIAGNOSIS — G4733 Obstructive sleep apnea (adult) (pediatric): Secondary | ICD-10-CM | POA: Diagnosis not present

## 2020-11-26 DIAGNOSIS — R634 Abnormal weight loss: Secondary | ICD-10-CM | POA: Diagnosis not present

## 2020-11-26 DIAGNOSIS — N400 Enlarged prostate without lower urinary tract symptoms: Secondary | ICD-10-CM | POA: Diagnosis not present

## 2020-11-26 DIAGNOSIS — Z8616 Personal history of COVID-19: Secondary | ICD-10-CM | POA: Diagnosis not present

## 2020-11-26 DIAGNOSIS — I4891 Unspecified atrial fibrillation: Secondary | ICD-10-CM | POA: Diagnosis not present

## 2020-11-26 DIAGNOSIS — G2581 Restless legs syndrome: Secondary | ICD-10-CM | POA: Diagnosis not present

## 2020-11-26 DIAGNOSIS — E230 Hypopituitarism: Secondary | ICD-10-CM | POA: Diagnosis not present

## 2020-11-26 DIAGNOSIS — I2510 Atherosclerotic heart disease of native coronary artery without angina pectoris: Secondary | ICD-10-CM | POA: Diagnosis not present

## 2020-11-26 DIAGNOSIS — M109 Gout, unspecified: Secondary | ICD-10-CM | POA: Diagnosis not present

## 2020-11-26 DIAGNOSIS — I110 Hypertensive heart disease with heart failure: Secondary | ICD-10-CM | POA: Diagnosis not present

## 2020-11-26 DIAGNOSIS — E119 Type 2 diabetes mellitus without complications: Secondary | ICD-10-CM | POA: Diagnosis not present

## 2020-11-26 DIAGNOSIS — Z8701 Personal history of pneumonia (recurrent): Secondary | ICD-10-CM | POA: Diagnosis not present

## 2020-11-26 DIAGNOSIS — I872 Venous insufficiency (chronic) (peripheral): Secondary | ICD-10-CM | POA: Diagnosis not present

## 2020-11-26 DIAGNOSIS — D320 Benign neoplasm of cerebral meninges: Secondary | ICD-10-CM | POA: Diagnosis not present

## 2020-11-26 DIAGNOSIS — G40909 Epilepsy, unspecified, not intractable, without status epilepticus: Secondary | ICD-10-CM | POA: Diagnosis not present

## 2020-11-27 DIAGNOSIS — Z9181 History of falling: Secondary | ICD-10-CM | POA: Diagnosis not present

## 2020-11-27 DIAGNOSIS — Z8679 Personal history of other diseases of the circulatory system: Secondary | ICD-10-CM | POA: Diagnosis not present

## 2020-11-27 DIAGNOSIS — G40909 Epilepsy, unspecified, not intractable, without status epilepticus: Secondary | ICD-10-CM | POA: Diagnosis not present

## 2020-11-27 DIAGNOSIS — D320 Benign neoplasm of cerebral meninges: Secondary | ICD-10-CM | POA: Diagnosis not present

## 2020-11-27 DIAGNOSIS — Z8616 Personal history of COVID-19: Secondary | ICD-10-CM | POA: Diagnosis not present

## 2020-11-27 DIAGNOSIS — R634 Abnormal weight loss: Secondary | ICD-10-CM | POA: Diagnosis not present

## 2020-11-29 DIAGNOSIS — Z8616 Personal history of COVID-19: Secondary | ICD-10-CM | POA: Diagnosis not present

## 2020-11-29 DIAGNOSIS — D320 Benign neoplasm of cerebral meninges: Secondary | ICD-10-CM | POA: Diagnosis not present

## 2020-11-29 DIAGNOSIS — Z9181 History of falling: Secondary | ICD-10-CM | POA: Diagnosis not present

## 2020-11-29 DIAGNOSIS — G40909 Epilepsy, unspecified, not intractable, without status epilepticus: Secondary | ICD-10-CM | POA: Diagnosis not present

## 2020-11-29 DIAGNOSIS — R634 Abnormal weight loss: Secondary | ICD-10-CM | POA: Diagnosis not present

## 2020-11-29 DIAGNOSIS — Z8679 Personal history of other diseases of the circulatory system: Secondary | ICD-10-CM | POA: Diagnosis not present

## 2020-11-30 DIAGNOSIS — G40909 Epilepsy, unspecified, not intractable, without status epilepticus: Secondary | ICD-10-CM | POA: Diagnosis not present

## 2020-11-30 DIAGNOSIS — Z8616 Personal history of COVID-19: Secondary | ICD-10-CM | POA: Diagnosis not present

## 2020-11-30 DIAGNOSIS — Z9181 History of falling: Secondary | ICD-10-CM | POA: Diagnosis not present

## 2020-11-30 DIAGNOSIS — D320 Benign neoplasm of cerebral meninges: Secondary | ICD-10-CM | POA: Diagnosis not present

## 2020-11-30 DIAGNOSIS — Z8679 Personal history of other diseases of the circulatory system: Secondary | ICD-10-CM | POA: Diagnosis not present

## 2020-11-30 DIAGNOSIS — R634 Abnormal weight loss: Secondary | ICD-10-CM | POA: Diagnosis not present

## 2020-12-01 DIAGNOSIS — G40909 Epilepsy, unspecified, not intractable, without status epilepticus: Secondary | ICD-10-CM | POA: Diagnosis not present

## 2020-12-01 DIAGNOSIS — Z9181 History of falling: Secondary | ICD-10-CM | POA: Diagnosis not present

## 2020-12-01 DIAGNOSIS — Z8616 Personal history of COVID-19: Secondary | ICD-10-CM | POA: Diagnosis not present

## 2020-12-01 DIAGNOSIS — R634 Abnormal weight loss: Secondary | ICD-10-CM | POA: Diagnosis not present

## 2020-12-01 DIAGNOSIS — Z8679 Personal history of other diseases of the circulatory system: Secondary | ICD-10-CM | POA: Diagnosis not present

## 2020-12-01 DIAGNOSIS — D320 Benign neoplasm of cerebral meninges: Secondary | ICD-10-CM | POA: Diagnosis not present

## 2020-12-26 DEATH — deceased
